# Patient Record
Sex: Female | Born: 1960 | Race: White | Hispanic: No | Marital: Married | Smoking: Former smoker
Health system: Southern US, Community
[De-identification: ages and names within clinical notes are randomized; demographics above are authoritative.]

## PROBLEM LIST (undated history)

## (undated) DIAGNOSIS — R570 Cardiogenic shock: Secondary | ICD-10-CM

## (undated) DIAGNOSIS — M1990 Unspecified osteoarthritis, unspecified site: Secondary | ICD-10-CM

## (undated) DIAGNOSIS — N170 Acute kidney failure with tubular necrosis: Secondary | ICD-10-CM

## (undated) DIAGNOSIS — I4892 Unspecified atrial flutter: Secondary | ICD-10-CM

## (undated) DIAGNOSIS — J9621 Acute and chronic respiratory failure with hypoxia: Secondary | ICD-10-CM

## (undated) DIAGNOSIS — I2510 Atherosclerotic heart disease of native coronary artery without angina pectoris: Secondary | ICD-10-CM

## (undated) HISTORY — PX: APPENDECTOMY: SHX54

## (undated) HISTORY — PX: TOTAL HIP ARTHROPLASTY: SHX124

## (undated) HISTORY — PX: TOTAL ABDOMINAL HYSTERECTOMY: SHX209

## (undated) HISTORY — PX: TONSILLECTOMY: SUR1361

---

## 2015-10-06 ENCOUNTER — Telehealth: Payer: Self-pay | Admitting: Internal Medicine

## 2015-10-06 DIAGNOSIS — K219 Gastro-esophageal reflux disease without esophagitis: Secondary | ICD-10-CM

## 2015-10-06 DIAGNOSIS — I119 Hypertensive heart disease without heart failure: Secondary | ICD-10-CM

## 2015-10-06 DIAGNOSIS — E785 Hyperlipidemia, unspecified: Secondary | ICD-10-CM

## 2015-10-06 DIAGNOSIS — I10 Essential (primary) hypertension: Secondary | ICD-10-CM

## 2015-10-06 HISTORY — DX: Hyperlipidemia, unspecified: E78.5

## 2015-10-06 HISTORY — DX: Essential (primary) hypertension: I10

## 2015-10-06 HISTORY — DX: Hypertensive heart disease without heart failure: I11.9

## 2015-10-06 HISTORY — DX: Gastro-esophageal reflux disease without esophagitis: K21.9

## 2015-10-06 NOTE — Telephone Encounter (Signed)
Potential transfer from [facility] per Dr. Earl Gala, but due to lack of SDU, rejected now.  55 year old lady with past medical history of hypertension, hyperlipidemia, GERD, arthritis, who presents with altered mental status.  Per EDP, patient became confused at work last night, and was sent home. Neighbor found the patient's mental status had worsened. Pt had an episode of arm shaking and leg stiffness, which lasted for about 30 seconds. Patient is very somnolent, falls asleep quickly. No other specific complaints.  In the emergency room, patient was found to have negative CT-head scan for acute intracranial abnormalities, negative chest x-ray, negative UDS, negative troponin 3, negative flu PCR, Tylenol and salicylate level normal, CK 781, negative alcohol, potassium 2.9, renal function normal, liver function normal, magnesium 2.2, blood pressure normal, temperature normal, no tachycardia. Oxygen desaturation to 80% in room air, which improved to 93 on nasal cannula oxygen. ABG showed pH 7.4, PCO2 40, PO2 55 on 2 L oxygen. Initially patient was accepted to step down bed, but there is no stepdown bed available now per better control. I asked EDP, dr. Allie Dimmer to call other facilities now. I also told Dr. Earl Gala that he can call me back before 7:00 AM to see if we have any SDU bed by that time. I recommended him to consult to neurologist for possible seizure and suggested to get CAT of chest to r/o PE. Now pt is not accepted due to lack of SDU bed.  Lorretta Harp, MD  [facility] Pager [phone]  If 7PM-7AM, please contact night-coverage [email or web address] Password [facility] 10/06/2015, 4:28 AM

## 2015-10-09 DIAGNOSIS — F32A Depression, unspecified: Secondary | ICD-10-CM

## 2015-10-09 HISTORY — DX: Depression, unspecified: F32.A

## 2015-11-10 DIAGNOSIS — F329 Major depressive disorder, single episode, unspecified: Secondary | ICD-10-CM | POA: Diagnosis not present

## 2015-11-10 DIAGNOSIS — I10 Essential (primary) hypertension: Secondary | ICD-10-CM | POA: Diagnosis not present

## 2015-11-10 DIAGNOSIS — R569 Unspecified convulsions: Secondary | ICD-10-CM | POA: Diagnosis not present

## 2015-11-10 DIAGNOSIS — R531 Weakness: Secondary | ICD-10-CM | POA: Diagnosis not present

## 2015-11-16 DIAGNOSIS — Z79899 Other long term (current) drug therapy: Secondary | ICD-10-CM | POA: Diagnosis not present

## 2015-11-16 DIAGNOSIS — R569 Unspecified convulsions: Secondary | ICD-10-CM | POA: Diagnosis not present

## 2015-12-02 DIAGNOSIS — Z6830 Body mass index (BMI) 30.0-30.9, adult: Secondary | ICD-10-CM | POA: Diagnosis not present

## 2015-12-02 DIAGNOSIS — R569 Unspecified convulsions: Secondary | ICD-10-CM | POA: Diagnosis not present

## 2015-12-02 DIAGNOSIS — Z79899 Other long term (current) drug therapy: Secondary | ICD-10-CM | POA: Diagnosis not present

## 2015-12-02 DIAGNOSIS — E538 Deficiency of other specified B group vitamins: Secondary | ICD-10-CM | POA: Diagnosis not present

## 2016-03-03 DIAGNOSIS — R51 Headache: Secondary | ICD-10-CM | POA: Diagnosis not present

## 2016-03-03 DIAGNOSIS — G479 Sleep disorder, unspecified: Secondary | ICD-10-CM | POA: Diagnosis not present

## 2016-03-03 DIAGNOSIS — R569 Unspecified convulsions: Secondary | ICD-10-CM | POA: Diagnosis not present

## 2016-03-03 DIAGNOSIS — G40909 Epilepsy, unspecified, not intractable, without status epilepticus: Secondary | ICD-10-CM

## 2016-03-03 HISTORY — DX: Epilepsy, unspecified, not intractable, without status epilepticus: G40.909

## 2016-04-05 DIAGNOSIS — E7800 Pure hypercholesterolemia, unspecified: Secondary | ICD-10-CM | POA: Diagnosis not present

## 2016-04-05 DIAGNOSIS — M159 Polyosteoarthritis, unspecified: Secondary | ICD-10-CM | POA: Diagnosis not present

## 2016-04-05 DIAGNOSIS — M25562 Pain in left knee: Secondary | ICD-10-CM | POA: Diagnosis not present

## 2016-04-05 DIAGNOSIS — Z1389 Encounter for screening for other disorder: Secondary | ICD-10-CM | POA: Diagnosis not present

## 2016-04-05 DIAGNOSIS — I10 Essential (primary) hypertension: Secondary | ICD-10-CM | POA: Diagnosis not present

## 2016-04-13 DIAGNOSIS — E7800 Pure hypercholesterolemia, unspecified: Secondary | ICD-10-CM | POA: Diagnosis not present

## 2016-04-13 DIAGNOSIS — M25552 Pain in left hip: Secondary | ICD-10-CM | POA: Diagnosis not present

## 2016-04-13 DIAGNOSIS — M159 Polyosteoarthritis, unspecified: Secondary | ICD-10-CM | POA: Diagnosis not present

## 2016-04-13 DIAGNOSIS — I10 Essential (primary) hypertension: Secondary | ICD-10-CM | POA: Diagnosis not present

## 2016-05-02 DIAGNOSIS — M25562 Pain in left knee: Secondary | ICD-10-CM | POA: Diagnosis not present

## 2016-05-02 DIAGNOSIS — I10 Essential (primary) hypertension: Secondary | ICD-10-CM | POA: Diagnosis not present

## 2016-05-02 DIAGNOSIS — E7800 Pure hypercholesterolemia, unspecified: Secondary | ICD-10-CM | POA: Diagnosis not present

## 2016-05-02 DIAGNOSIS — F329 Major depressive disorder, single episode, unspecified: Secondary | ICD-10-CM | POA: Diagnosis not present

## 2016-07-13 DIAGNOSIS — Z23 Encounter for immunization: Secondary | ICD-10-CM | POA: Diagnosis not present

## 2016-07-13 DIAGNOSIS — I10 Essential (primary) hypertension: Secondary | ICD-10-CM | POA: Diagnosis not present

## 2016-07-13 DIAGNOSIS — F329 Major depressive disorder, single episode, unspecified: Secondary | ICD-10-CM | POA: Diagnosis not present

## 2016-07-13 DIAGNOSIS — E7800 Pure hypercholesterolemia, unspecified: Secondary | ICD-10-CM | POA: Diagnosis not present

## 2016-07-13 DIAGNOSIS — M25562 Pain in left knee: Secondary | ICD-10-CM | POA: Diagnosis not present

## 2016-08-18 DIAGNOSIS — Z79899 Other long term (current) drug therapy: Secondary | ICD-10-CM | POA: Diagnosis not present

## 2016-08-18 DIAGNOSIS — G479 Sleep disorder, unspecified: Secondary | ICD-10-CM | POA: Diagnosis not present

## 2016-08-18 DIAGNOSIS — R569 Unspecified convulsions: Secondary | ICD-10-CM | POA: Diagnosis not present

## 2016-08-18 DIAGNOSIS — Z6832 Body mass index (BMI) 32.0-32.9, adult: Secondary | ICD-10-CM | POA: Diagnosis not present

## 2016-09-22 DIAGNOSIS — G479 Sleep disorder, unspecified: Secondary | ICD-10-CM | POA: Diagnosis not present

## 2016-09-22 DIAGNOSIS — Z79899 Other long term (current) drug therapy: Secondary | ICD-10-CM | POA: Diagnosis not present

## 2016-09-22 DIAGNOSIS — Z6832 Body mass index (BMI) 32.0-32.9, adult: Secondary | ICD-10-CM | POA: Diagnosis not present

## 2016-09-22 DIAGNOSIS — R569 Unspecified convulsions: Secondary | ICD-10-CM | POA: Diagnosis not present

## 2016-10-04 DIAGNOSIS — M25562 Pain in left knee: Secondary | ICD-10-CM | POA: Diagnosis not present

## 2016-10-04 DIAGNOSIS — I10 Essential (primary) hypertension: Secondary | ICD-10-CM | POA: Diagnosis not present

## 2016-10-04 DIAGNOSIS — M159 Polyosteoarthritis, unspecified: Secondary | ICD-10-CM | POA: Diagnosis not present

## 2016-10-04 DIAGNOSIS — E7800 Pure hypercholesterolemia, unspecified: Secondary | ICD-10-CM | POA: Diagnosis not present

## 2016-11-09 DIAGNOSIS — F329 Major depressive disorder, single episode, unspecified: Secondary | ICD-10-CM | POA: Diagnosis not present

## 2016-11-09 DIAGNOSIS — Z79899 Other long term (current) drug therapy: Secondary | ICD-10-CM | POA: Diagnosis not present

## 2016-11-09 DIAGNOSIS — E7800 Pure hypercholesterolemia, unspecified: Secondary | ICD-10-CM | POA: Diagnosis not present

## 2016-11-09 DIAGNOSIS — I10 Essential (primary) hypertension: Secondary | ICD-10-CM | POA: Diagnosis not present

## 2016-11-09 DIAGNOSIS — M25562 Pain in left knee: Secondary | ICD-10-CM | POA: Diagnosis not present

## 2016-11-22 DIAGNOSIS — R569 Unspecified convulsions: Secondary | ICD-10-CM | POA: Diagnosis not present

## 2016-11-22 DIAGNOSIS — Z6832 Body mass index (BMI) 32.0-32.9, adult: Secondary | ICD-10-CM | POA: Diagnosis not present

## 2017-04-17 DIAGNOSIS — I10 Essential (primary) hypertension: Secondary | ICD-10-CM | POA: Diagnosis not present

## 2017-04-17 DIAGNOSIS — R42 Dizziness and giddiness: Secondary | ICD-10-CM | POA: Diagnosis not present

## 2017-04-17 DIAGNOSIS — E7800 Pure hypercholesterolemia, unspecified: Secondary | ICD-10-CM | POA: Diagnosis not present

## 2017-04-17 DIAGNOSIS — F329 Major depressive disorder, single episode, unspecified: Secondary | ICD-10-CM | POA: Diagnosis not present

## 2017-05-31 DIAGNOSIS — Z1389 Encounter for screening for other disorder: Secondary | ICD-10-CM | POA: Diagnosis not present

## 2017-05-31 DIAGNOSIS — F329 Major depressive disorder, single episode, unspecified: Secondary | ICD-10-CM | POA: Diagnosis not present

## 2017-05-31 DIAGNOSIS — I10 Essential (primary) hypertension: Secondary | ICD-10-CM | POA: Diagnosis not present

## 2017-05-31 DIAGNOSIS — M25562 Pain in left knee: Secondary | ICD-10-CM | POA: Diagnosis not present

## 2017-05-31 DIAGNOSIS — Z23 Encounter for immunization: Secondary | ICD-10-CM | POA: Diagnosis not present

## 2017-05-31 DIAGNOSIS — E7800 Pure hypercholesterolemia, unspecified: Secondary | ICD-10-CM | POA: Diagnosis not present

## 2017-06-15 DIAGNOSIS — M25562 Pain in left knee: Secondary | ICD-10-CM | POA: Diagnosis not present

## 2017-06-15 DIAGNOSIS — F329 Major depressive disorder, single episode, unspecified: Secondary | ICD-10-CM | POA: Diagnosis not present

## 2017-06-15 DIAGNOSIS — E7800 Pure hypercholesterolemia, unspecified: Secondary | ICD-10-CM | POA: Diagnosis not present

## 2017-06-15 DIAGNOSIS — I10 Essential (primary) hypertension: Secondary | ICD-10-CM | POA: Diagnosis not present

## 2017-07-13 DIAGNOSIS — I10 Essential (primary) hypertension: Secondary | ICD-10-CM | POA: Diagnosis not present

## 2017-07-13 DIAGNOSIS — M25562 Pain in left knee: Secondary | ICD-10-CM | POA: Diagnosis not present

## 2017-07-13 DIAGNOSIS — E7800 Pure hypercholesterolemia, unspecified: Secondary | ICD-10-CM | POA: Diagnosis not present

## 2017-07-13 DIAGNOSIS — F329 Major depressive disorder, single episode, unspecified: Secondary | ICD-10-CM | POA: Diagnosis not present

## 2017-07-20 DIAGNOSIS — Z1231 Encounter for screening mammogram for malignant neoplasm of breast: Secondary | ICD-10-CM | POA: Diagnosis not present

## 2017-08-24 DIAGNOSIS — M159 Polyosteoarthritis, unspecified: Secondary | ICD-10-CM | POA: Diagnosis not present

## 2017-08-24 DIAGNOSIS — M25562 Pain in left knee: Secondary | ICD-10-CM | POA: Diagnosis not present

## 2017-08-24 DIAGNOSIS — E7800 Pure hypercholesterolemia, unspecified: Secondary | ICD-10-CM | POA: Diagnosis not present

## 2017-08-24 DIAGNOSIS — I10 Essential (primary) hypertension: Secondary | ICD-10-CM | POA: Diagnosis not present

## 2017-10-05 DIAGNOSIS — M25562 Pain in left knee: Secondary | ICD-10-CM | POA: Diagnosis not present

## 2017-10-05 DIAGNOSIS — F329 Major depressive disorder, single episode, unspecified: Secondary | ICD-10-CM | POA: Diagnosis not present

## 2017-10-05 DIAGNOSIS — E7800 Pure hypercholesterolemia, unspecified: Secondary | ICD-10-CM | POA: Diagnosis not present

## 2017-10-05 DIAGNOSIS — I10 Essential (primary) hypertension: Secondary | ICD-10-CM | POA: Diagnosis not present

## 2017-11-02 DIAGNOSIS — F329 Major depressive disorder, single episode, unspecified: Secondary | ICD-10-CM | POA: Diagnosis not present

## 2017-11-02 DIAGNOSIS — I10 Essential (primary) hypertension: Secondary | ICD-10-CM | POA: Diagnosis not present

## 2017-11-02 DIAGNOSIS — F419 Anxiety disorder, unspecified: Secondary | ICD-10-CM | POA: Diagnosis not present

## 2017-11-02 DIAGNOSIS — M25562 Pain in left knee: Secondary | ICD-10-CM | POA: Diagnosis not present

## 2018-02-22 DIAGNOSIS — F419 Anxiety disorder, unspecified: Secondary | ICD-10-CM | POA: Diagnosis not present

## 2018-02-22 DIAGNOSIS — M25562 Pain in left knee: Secondary | ICD-10-CM | POA: Diagnosis not present

## 2018-02-22 DIAGNOSIS — I10 Essential (primary) hypertension: Secondary | ICD-10-CM | POA: Diagnosis not present

## 2018-02-22 DIAGNOSIS — F329 Major depressive disorder, single episode, unspecified: Secondary | ICD-10-CM | POA: Diagnosis not present

## 2018-04-27 DIAGNOSIS — E7800 Pure hypercholesterolemia, unspecified: Secondary | ICD-10-CM | POA: Diagnosis not present

## 2018-04-27 DIAGNOSIS — F419 Anxiety disorder, unspecified: Secondary | ICD-10-CM | POA: Diagnosis not present

## 2018-04-27 DIAGNOSIS — I10 Essential (primary) hypertension: Secondary | ICD-10-CM | POA: Diagnosis not present

## 2018-04-27 DIAGNOSIS — F329 Major depressive disorder, single episode, unspecified: Secondary | ICD-10-CM | POA: Diagnosis not present

## 2018-04-27 DIAGNOSIS — M25562 Pain in left knee: Secondary | ICD-10-CM | POA: Diagnosis not present

## 2018-06-14 DIAGNOSIS — E7800 Pure hypercholesterolemia, unspecified: Secondary | ICD-10-CM | POA: Diagnosis not present

## 2018-06-14 DIAGNOSIS — M25562 Pain in left knee: Secondary | ICD-10-CM | POA: Diagnosis not present

## 2018-06-14 DIAGNOSIS — I10 Essential (primary) hypertension: Secondary | ICD-10-CM | POA: Diagnosis not present

## 2018-06-14 DIAGNOSIS — F419 Anxiety disorder, unspecified: Secondary | ICD-10-CM | POA: Diagnosis not present

## 2018-06-14 DIAGNOSIS — Z23 Encounter for immunization: Secondary | ICD-10-CM | POA: Diagnosis not present

## 2018-09-03 DIAGNOSIS — E7800 Pure hypercholesterolemia, unspecified: Secondary | ICD-10-CM | POA: Diagnosis not present

## 2018-09-03 DIAGNOSIS — J069 Acute upper respiratory infection, unspecified: Secondary | ICD-10-CM | POA: Diagnosis not present

## 2018-09-03 DIAGNOSIS — R42 Dizziness and giddiness: Secondary | ICD-10-CM | POA: Diagnosis not present

## 2018-09-03 DIAGNOSIS — I10 Essential (primary) hypertension: Secondary | ICD-10-CM | POA: Diagnosis not present

## 2018-09-19 DIAGNOSIS — I10 Essential (primary) hypertension: Secondary | ICD-10-CM | POA: Diagnosis not present

## 2018-09-19 DIAGNOSIS — Z1331 Encounter for screening for depression: Secondary | ICD-10-CM | POA: Diagnosis not present

## 2018-09-19 DIAGNOSIS — K219 Gastro-esophageal reflux disease without esophagitis: Secondary | ICD-10-CM | POA: Diagnosis not present

## 2018-09-19 DIAGNOSIS — F419 Anxiety disorder, unspecified: Secondary | ICD-10-CM | POA: Diagnosis not present

## 2018-09-19 DIAGNOSIS — M159 Polyosteoarthritis, unspecified: Secondary | ICD-10-CM | POA: Diagnosis not present

## 2018-09-19 DIAGNOSIS — E7800 Pure hypercholesterolemia, unspecified: Secondary | ICD-10-CM | POA: Diagnosis not present

## 2018-09-19 DIAGNOSIS — M25562 Pain in left knee: Secondary | ICD-10-CM | POA: Diagnosis not present

## 2019-06-21 DIAGNOSIS — M25562 Pain in left knee: Secondary | ICD-10-CM | POA: Diagnosis not present

## 2019-06-21 DIAGNOSIS — M159 Polyosteoarthritis, unspecified: Secondary | ICD-10-CM | POA: Diagnosis not present

## 2019-06-21 DIAGNOSIS — F419 Anxiety disorder, unspecified: Secondary | ICD-10-CM | POA: Diagnosis not present

## 2019-06-21 DIAGNOSIS — K219 Gastro-esophageal reflux disease without esophagitis: Secondary | ICD-10-CM | POA: Diagnosis not present

## 2019-06-21 DIAGNOSIS — E7800 Pure hypercholesterolemia, unspecified: Secondary | ICD-10-CM | POA: Diagnosis not present

## 2019-06-21 DIAGNOSIS — I10 Essential (primary) hypertension: Secondary | ICD-10-CM | POA: Diagnosis not present

## 2019-11-14 DIAGNOSIS — M25562 Pain in left knee: Secondary | ICD-10-CM | POA: Diagnosis not present

## 2019-11-14 DIAGNOSIS — M159 Polyosteoarthritis, unspecified: Secondary | ICD-10-CM | POA: Diagnosis not present

## 2019-11-14 DIAGNOSIS — I10 Essential (primary) hypertension: Secondary | ICD-10-CM | POA: Diagnosis not present

## 2019-11-14 DIAGNOSIS — K219 Gastro-esophageal reflux disease without esophagitis: Secondary | ICD-10-CM | POA: Diagnosis not present

## 2019-11-18 DIAGNOSIS — M25562 Pain in left knee: Secondary | ICD-10-CM | POA: Diagnosis not present

## 2019-11-18 DIAGNOSIS — E7800 Pure hypercholesterolemia, unspecified: Secondary | ICD-10-CM | POA: Diagnosis not present

## 2019-11-18 DIAGNOSIS — R42 Dizziness and giddiness: Secondary | ICD-10-CM | POA: Diagnosis not present

## 2019-11-18 DIAGNOSIS — F329 Major depressive disorder, single episode, unspecified: Secondary | ICD-10-CM | POA: Diagnosis not present

## 2019-11-18 DIAGNOSIS — I10 Essential (primary) hypertension: Secondary | ICD-10-CM | POA: Diagnosis not present

## 2019-11-18 DIAGNOSIS — K219 Gastro-esophageal reflux disease without esophagitis: Secondary | ICD-10-CM | POA: Diagnosis not present

## 2019-11-24 DIAGNOSIS — K219 Gastro-esophageal reflux disease without esophagitis: Secondary | ICD-10-CM | POA: Diagnosis not present

## 2019-11-24 DIAGNOSIS — R42 Dizziness and giddiness: Secondary | ICD-10-CM | POA: Diagnosis not present

## 2019-11-24 DIAGNOSIS — F329 Major depressive disorder, single episode, unspecified: Secondary | ICD-10-CM | POA: Diagnosis not present

## 2019-11-24 DIAGNOSIS — M25552 Pain in left hip: Secondary | ICD-10-CM | POA: Diagnosis not present

## 2020-04-25 DIAGNOSIS — R55 Syncope and collapse: Secondary | ICD-10-CM

## 2020-04-25 DIAGNOSIS — Z8669 Personal history of other diseases of the nervous system and sense organs: Secondary | ICD-10-CM

## 2020-04-25 DIAGNOSIS — I10 Essential (primary) hypertension: Secondary | ICD-10-CM | POA: Insufficient documentation

## 2020-04-25 DIAGNOSIS — R0602 Shortness of breath: Secondary | ICD-10-CM

## 2020-04-25 HISTORY — DX: Shortness of breath: R06.02

## 2020-04-25 HISTORY — DX: Personal history of other diseases of the nervous system and sense organs: Z86.69

## 2020-04-25 HISTORY — DX: Syncope and collapse: R55

## 2020-04-26 HISTORY — PX: AORTIC VALVE REPLACEMENT (AVR)/CORONARY ARTERY BYPASS GRAFTING (CABG): SHX5725

## 2020-06-01 ENCOUNTER — Other Ambulatory Visit (HOSPITAL_COMMUNITY): Payer: 59

## 2020-06-01 ENCOUNTER — Inpatient Hospital Stay
Admission: EM | Admit: 2020-06-01 | Discharge: 2020-07-16 | Disposition: A | Discharge status: Rehabilitation Facility | Payer: 59 | Source: Other Acute Inpatient Hospital | Attending: Internal Medicine | Admitting: Internal Medicine

## 2020-06-01 DIAGNOSIS — R570 Cardiogenic shock: Secondary | ICD-10-CM | POA: Diagnosis present

## 2020-06-01 DIAGNOSIS — I4892 Unspecified atrial flutter: Secondary | ICD-10-CM | POA: Diagnosis present

## 2020-06-01 DIAGNOSIS — J9621 Acute and chronic respiratory failure with hypoxia: Secondary | ICD-10-CM | POA: Diagnosis present

## 2020-06-01 DIAGNOSIS — N170 Acute kidney failure with tubular necrosis: Secondary | ICD-10-CM | POA: Diagnosis present

## 2020-06-01 DIAGNOSIS — J189 Pneumonia, unspecified organism: Secondary | ICD-10-CM

## 2020-06-01 DIAGNOSIS — R509 Fever, unspecified: Secondary | ICD-10-CM

## 2020-06-01 DIAGNOSIS — D72829 Elevated white blood cell count, unspecified: Secondary | ICD-10-CM

## 2020-06-01 DIAGNOSIS — T85598A Other mechanical complication of other gastrointestinal prosthetic devices, implants and grafts, initial encounter: Secondary | ICD-10-CM

## 2020-06-01 DIAGNOSIS — J9690 Respiratory failure, unspecified, unspecified whether with hypoxia or hypercapnia: Secondary | ICD-10-CM

## 2020-06-01 DIAGNOSIS — I2510 Atherosclerotic heart disease of native coronary artery without angina pectoris: Secondary | ICD-10-CM | POA: Diagnosis present

## 2020-06-01 HISTORY — DX: Unspecified atrial flutter: I48.92

## 2020-06-01 HISTORY — DX: Atherosclerotic heart disease of native coronary artery without angina pectoris: I25.10

## 2020-06-01 HISTORY — DX: Acute kidney failure with tubular necrosis: N17.0

## 2020-06-01 HISTORY — DX: Cardiogenic shock: R57.0

## 2020-06-01 HISTORY — DX: Acute and chronic respiratory failure with hypoxia: J96.21

## 2020-06-01 IMAGING — DX DG ABD PORTABLE 1V
1 series · 1 of 1 positions shown · non-contrast
Comparison: None.

CLINICAL DATA: NG tube placement

EXAM:
PORTABLE ABDOMEN - 1 VIEW

[abdomen kub]
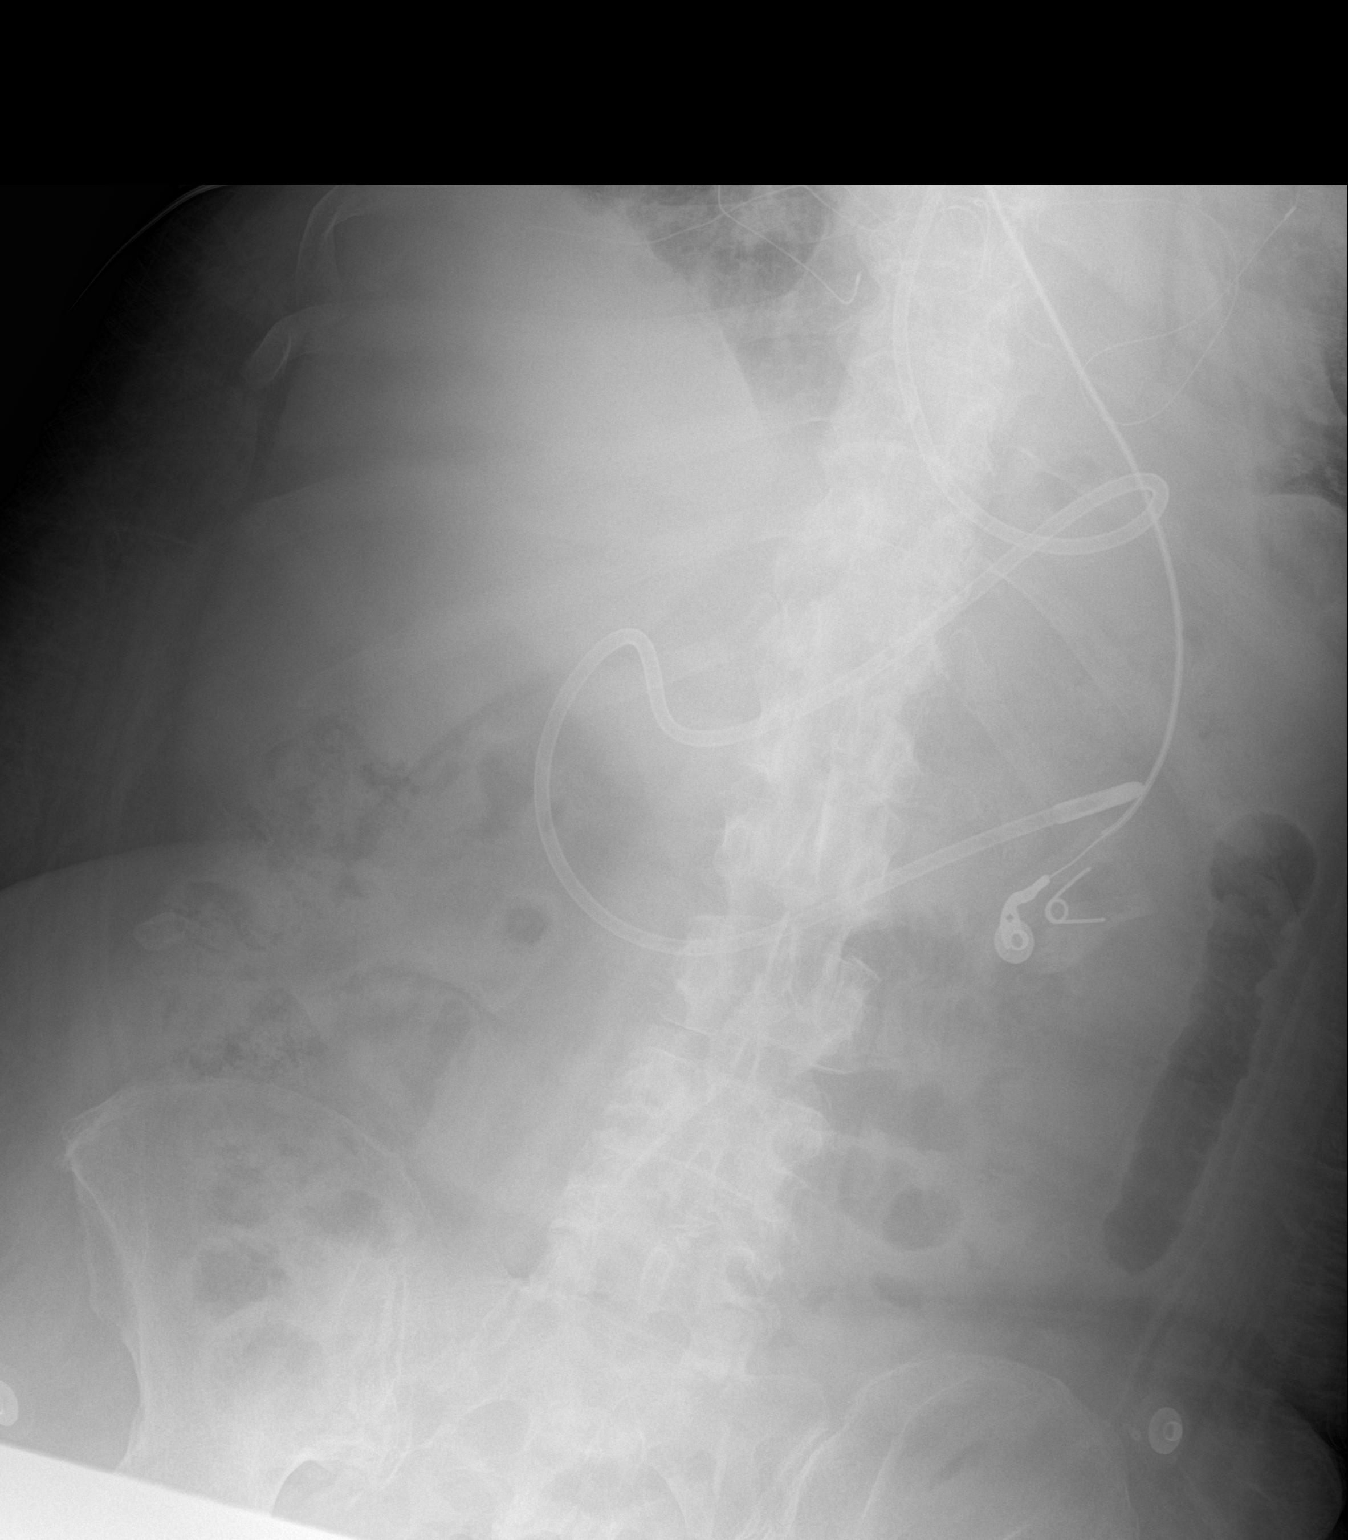

[1 of 1 positions shown; findings below may reference images not displayed]

FINDINGS: The tip of the feeding tube is likely located within the proximal
jejunum. The visualized bowel gas pattern is unremarkable. There is
a moderate amount of stool in the ascending colon. There are no
radiopaque kidney stones.
IMPRESSION: Feeding tube tip projects over the proximal jejunum.

## 2020-06-02 ENCOUNTER — Other Ambulatory Visit (HOSPITAL_COMMUNITY): Payer: 59

## 2020-06-02 ENCOUNTER — Encounter: Payer: Self-pay | Admitting: Internal Medicine

## 2020-06-02 DIAGNOSIS — I4892 Unspecified atrial flutter: Secondary | ICD-10-CM | POA: Diagnosis present

## 2020-06-02 DIAGNOSIS — I2510 Atherosclerotic heart disease of native coronary artery without angina pectoris: Secondary | ICD-10-CM

## 2020-06-02 DIAGNOSIS — J9621 Acute and chronic respiratory failure with hypoxia: Secondary | ICD-10-CM

## 2020-06-02 DIAGNOSIS — R570 Cardiogenic shock: Secondary | ICD-10-CM | POA: Diagnosis not present

## 2020-06-02 DIAGNOSIS — N170 Acute kidney failure with tubular necrosis: Secondary | ICD-10-CM

## 2020-06-02 DIAGNOSIS — I483 Typical atrial flutter: Secondary | ICD-10-CM | POA: Diagnosis not present

## 2020-06-02 LAB — URINALYSIS, ROUTINE W REFLEX MICROSCOPIC
Bilirubin Urine: NEGATIVE
Glucose, UA: NEGATIVE mg/dL
Ketones, ur: NEGATIVE mg/dL
Nitrite: NEGATIVE
Protein, ur: 100 mg/dL — AB
Specific Gravity, Urine: 1.016 (ref 1.005–1.030)
pH: 5 (ref 5.0–8.0)

## 2020-06-02 LAB — CBC
HCT: 29 % — ABNORMAL LOW (ref 36.0–46.0)
Hemoglobin: 8.5 g/dL — ABNORMAL LOW (ref 12.0–15.0)
MCH: 28.9 pg (ref 26.0–34.0)
MCHC: 29.3 g/dL — ABNORMAL LOW (ref 30.0–36.0)
MCV: 98.6 fL (ref 80.0–100.0)
Platelets: 346 10*3/uL (ref 150–400)
RBC: 2.94 MIL/uL — ABNORMAL LOW (ref 3.87–5.11)
RDW: 16.3 % — ABNORMAL HIGH (ref 11.5–15.5)
WBC: 16.5 10*3/uL — ABNORMAL HIGH (ref 4.0–10.5)
nRBC: 0 % (ref 0.0–0.2)

## 2020-06-02 LAB — BASIC METABOLIC PANEL
Anion gap: 12 (ref 5–15)
BUN: 50 mg/dL — ABNORMAL HIGH (ref 6–20)
CO2: 34 mmol/L — ABNORMAL HIGH (ref 22–32)
Calcium: 9.2 mg/dL (ref 8.9–10.3)
Chloride: 99 mmol/L (ref 98–111)
Creatinine, Ser: 1.03 mg/dL — ABNORMAL HIGH (ref 0.44–1.00)
GFR, Estimated: >60 mL/min (ref >60)
Glucose, Bld: 154 mg/dL — ABNORMAL HIGH (ref 70–99)
Potassium: 3.7 mmol/L (ref 3.5–5.1)
Sodium: 145 mmol/L (ref 135–145)

## 2020-06-02 IMAGING — DX DG CHEST 1V PORT
1 series · 1 of 1 positions shown · non-contrast
Comparison: Chest radiograph and chest CT [DATE].
Abdominal radiograph [DATE]

CLINICAL DATA: Difficulty breathing

EXAM:
PORTABLE CHEST 1 VIEW

[chest ap]
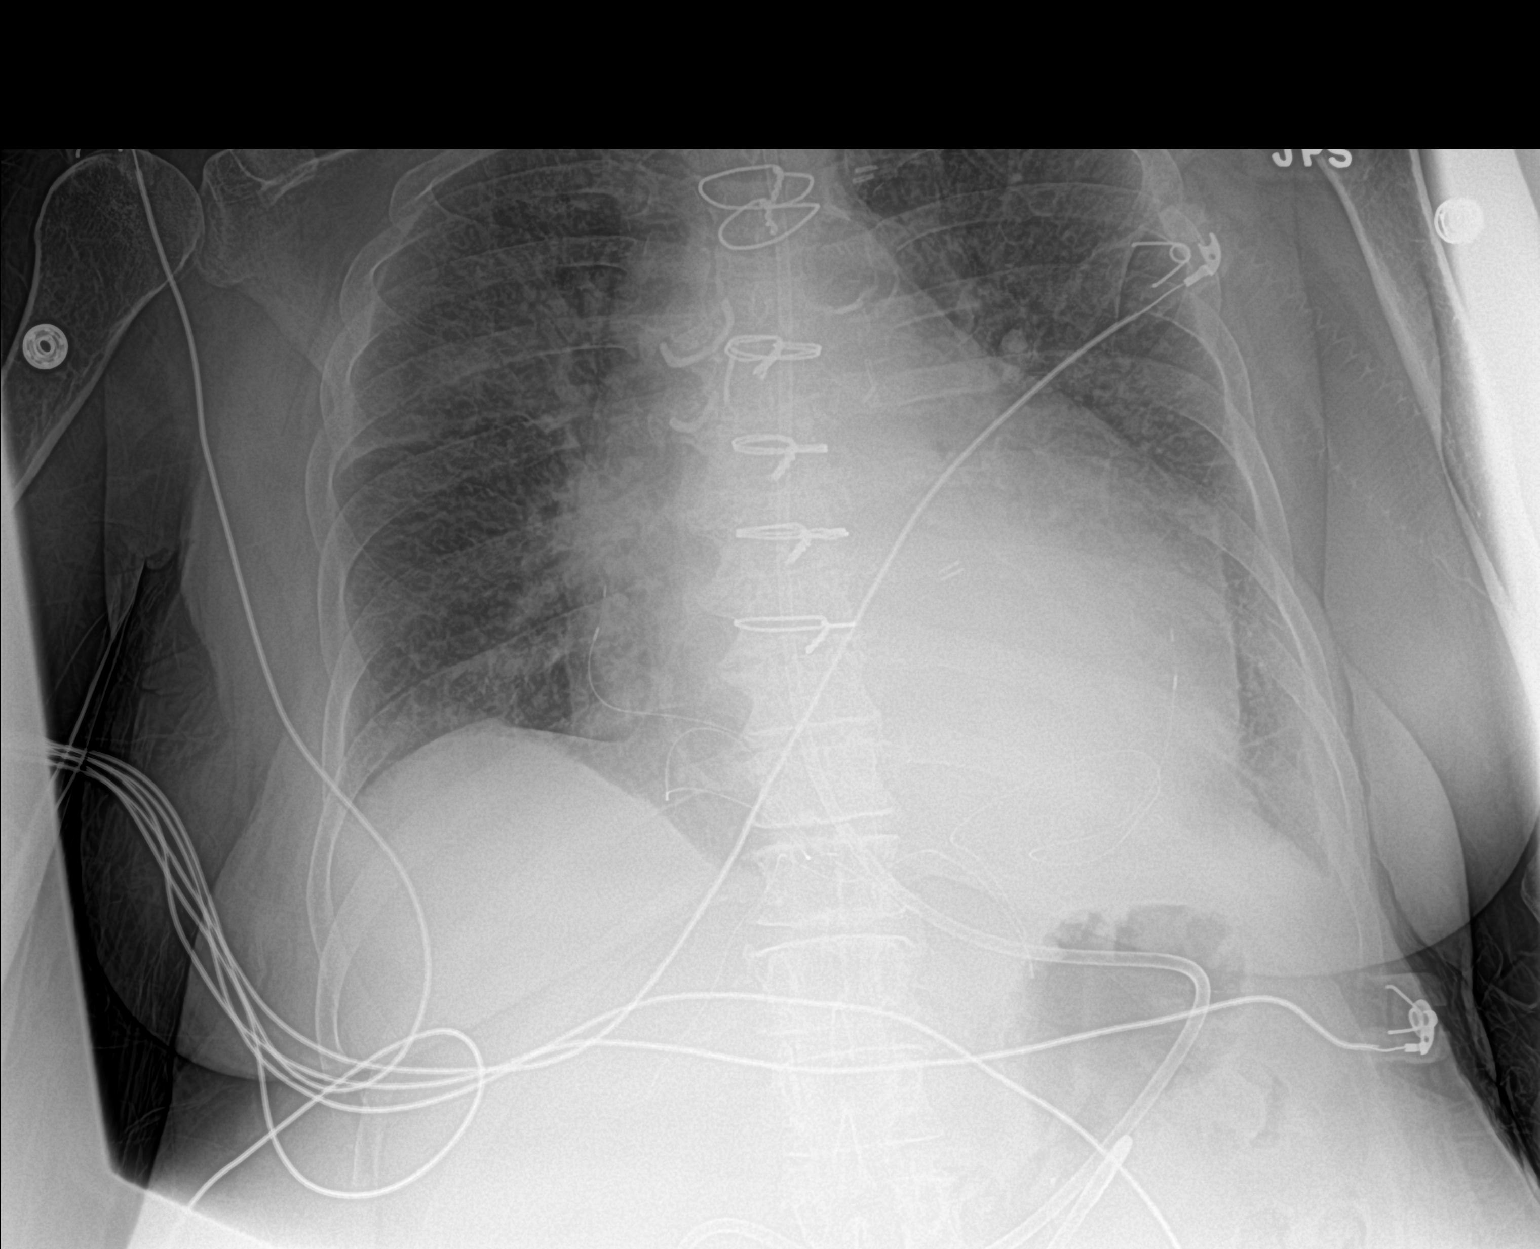

[1 of 1 positions shown; findings below may reference images not displayed]

FINDINGS: Tracheostomy catheter tip is 7.9 cm above the carina. Feeding tube
tip is in the left upper quadrant. Based on abdomen radiograph from
1 day prior, tip is felt to be in the distal duodenum. There are
pacemaker wires attached to the right heart.

There is cardiomegaly with pulmonary venous hypertension. There is
interstitial edema with subtle ill-defined opacity in the right
upper lobe. There is aortic atherosclerosis. Status post coronary
artery bypass grafting. No adenopathy. No bone lesions.
IMPRESSION: Tube positions as described without pneumothorax. There is
cardiomegaly with pulmonary vascular congestion. There is mild
interstitial edema. Question mild alveolar edema versus developing
pneumonia right upper lobe. Patient is status post coronary artery
bypass grafting.

Aortic Atherosclerosis ([BP]-[BP]).

## 2020-06-02 NOTE — Consult Note (Signed)
Pulmonary Critical Care Medicine Teresa [name]  PULMONARY SERVICE  Date of Service: 06/02/2020  PULMONARY CRITICAL CARE CONSULT   [name]  NWG:[id number]  DOB: [birth year]   DOA: 06/01/2020  Referring Physician: Carron Curie, MD  HPI: Teresa [name] is a 59 y.o. female seen for follow up of Acute on Chronic Respiratory Failure.  Patient has multiple medical problems including hypertension hyperlipidemia ongoing smoking seizure disorder who came into the hospital because of syncope.  Patient has been experiencing some chest pain and shortness of breath.  Apparently was discovered to have a non-STEMI at that time.  Patient went to the Cath Lab and had a left heart catheterization and was subsequently sent to the OR for CABG on April 27, 2020.  Patient apparently had cardiogenic shock in the setting of ACS at that time.  Patient had significant blood loss requiring transfusions postoperatively required vasopressors inotropic support and CRRT.  Patient was not able to come off the ventilator and ended up having to have a tracheostomy.  Now transferred to our facility for further management.  Currently is off the ventilator on T collar and has been requiring 40% FiO2  Review of Systems:  ROS performed and is unremarkable other than noted above.  History reviewed. No pertinent past medical history.  Past Surgical History:  Procedure Laterality Date  . CORONARY ARTERY BYPASS GRAFT N/A 04/25/2020  Procedure: CABG ON PUMP - CORONARY ARTERY BYPASS GRAFT X 4; Surgeon: Tiajuana Amass, MD; Location: [facility] MAIN OR; Service: Cardiothoracic; Laterality: N/A;  . DEBRIDEMENT STERNAL N/A 04/30/2020  Procedure: IRRIGATION & DEBRIDEMENT CHEST WALL / STERNUM; Surgeon: Tiajuana Amass, MD; Location: [facility] MAIN OR; Service: Cardiothoracic; Laterality: N/A;  . IMPELLA INSERTION Left 04/25/2020  Procedure: IMPELLA INSERTION; Surgeon: Tiajuana Amass, MD; Location[facility] MA[facility]; Service:  Cardiothoracic; Laterality: Left;  . IMPELLA REMOVAL Left 05/07/2020  Procedure: IMPELLA REMOVAL; Surgeon: Tiajuana Amass, MD; Location: [facility] MAIN OR; Service: Cardiothoracic; Laterality: Left;  . TRACHEOSTOMY N/A 05/10/2020  Procedure: TRACHEOTOMY; Surgeon: Tiajuana Amass, MD;  . Cardiogenic shock (HCC) 04/26/2020  . NSTEMI (non-ST elevated myocardial infarction) (HCC) 04/25/2020  . Syncope 04/25/2020  . History of tonic-clonic seizures 04/25/2020  . Essential hypertension 04/25/2020  . Hyperlipidemia 04/25/2020  . Shortness of breath 04/25/2020   Family History of: Details  Colon Cancer No  Inflammatory Bowel Disease No  Liver Disease No   Social History   Socioeconomic History  . Marital status: Single  Spouse name: Not on file  . Number of children: Not on file  . Years of education: Not on file  . Highest education level: Not on file  Occupational History  . Not on file  Tobacco Use  . Smoking status: Current Every Day Smoker  Packs/day: 1.50  Years: 40.00  Pack years: 60.00  Types: Cigarettes    Medications: Reviewed on Rounds  Physical Exam:  Vitals: Temperature is 98.6 pulse 97 respiratory rate 26 blood pressure is 132/80 saturations 98%  Ventilator Settings on T collar with an FiO2 of 40%  . General: Comfortable at this time . Eyes: Grossly normal lids, irises & conjunctiva . ENT: grossly tongue is normal . Neck: no obvious mass . Cardiovascular: S1-S2 normal no gallop or rub . Respiratory: Scattered rhonchi expansion is equal . Abdomen: Soft and nontender . Skin: no rash seen on limited exam . Musculoskeletal: not rigid . Psychiatric:unable to assess . Neurologic: no seizure no involuntary movements  Labs on Admission:  Basic Metabolic Panel: Recent Labs  Lab 06/02/20 0507  NA 145  K 3.7  CL 99  CO2 34*  GLUCOSE 154*  BUN 50*  CREATININE 1.03*  CALCIUM 9.2    No results for input(s): PHART, PCO2ART, PO2ART, HCO3, O2SAT in  the last 168 hours.  Liver Function Tests: No results for input(s): AST, ALT, ALKPHOS, BILITOT, PROT, ALBUMIN in the last 168 hours. No results for input(s): LIPASE, AMYLASE in the last 168 hours. No results for input(s): AMMONIA in the last 168 hours.  CBC: Recent Labs  Lab 06/02/20 0507  WBC 16.5*  HGB 8.5*  HCT 29.0*  MCV 98.6  PLT 346    Cardiac Enzymes: No results for input(s): CKTOTAL, CKMB, CKMBINDEX, TROPONINI in the last 168 hours.  BNP (last 3 results) No results for input(s): BNP in the last 8760 hours.  ProBNP (last 3 results) No results for input(s): PROBNP in the last 8760 hours.   Radiological Exams on Admission: DG CHEST PORT 1 VIEW  Result Date: 06/02/2020 CLINICAL DATA:  Difficulty breathing EXAM: PORTABLE CHEST 1 VIEW COMPARISON:  Chest radiograph and chest CT April 24, 2020. Abdominal radiograph June 01, 2020 FINDINGS: Tracheostomy catheter tip is 7.9 cm above the carina. Feeding tube tip is in the left upper quadrant. Based on abdomen radiograph from 1 day prior, tip is felt to be in the distal duodenum. 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 There is cardiomegaly with pulmonary venous hypertension. There is interstitial edema with subtle ill-defined opacity in the right upper lobe. There is aortic atherosclerosis. Status post coronary artery bypass grafting. No adenopathy. No bone lesions. IMPRESSION: Tube positions as described without pneumothorax. There is cardiomegaly with pulmonary vascular congestion. There is mild interstitial edema. Question mild alveolar edema versus developing pneumonia right upper lobe. Patient is status post coronary artery bypass grafting. Aortic Atherosclerosis (ICD10-I70.0). Electronically Signed   By: Bretta Bang III M.D.   On: 06/02/2020 13:00   DG Abd Portable 1V  Result Date: 06/01/2020 CLINICAL DATA:  NG tube placement EXAM: PORTABLE ABDOMEN - 1 VIEW COMPARISON:  None. FINDINGS: The tip of the  feeding tube is likely located within the proximal jejunum. The visualized bowel gas pattern is unremarkable. There is a moderate amount of stool in the ascending colon. There are no radiopaque kidney stones. IMPRESSION: Feeding tube tip projects over the proximal jejunum. Electronically Signed   By: Katherine Mantle M.D.   On: 06/01/2020 23:57    Assessment/Plan Active Problems:   Acute on chronic respiratory failure with hypoxia (HCC)   Atrial flutter (HCC)   Cardiogenic shock (HCC)   Coronary artery disease involving native coronary artery of native heart   Acute renal failure due to tubular necrosis (HCC)   1. Acute on chronic respiratory failure with hypoxia patient currently is off the ventilator has been on 40% FiO2 looks pretty good as far as ability to wean right now we will continue to follow closely.  We will continue to keep the patient euvolemic.  The patient will need a cardiology consultation. 2. Atrial flutter patient's rate controlled we will continue with supportive care 3. Coronary artery disease native vessel status post CABG we will continue to follow along.  Patient is status post non-STEMI. 4. Acute renal failure patient's creatinine appears to have come down to 1.03 BUN is still slightly elevated.  Patient did have acute renal failure which had required CRRT at the other facility need to continue  to follow the fluid status closely  I have personally seen and evaluated the patient, evaluated laboratory and imaging results, formulated the assessment and plan and placed orders. The Patient requires high complexity decision making with multiple systems involvement.  Case was discussed on Rounds with the Respiratory Therapy Director and the Respiratory staff Time Spent  Yevonne Pax, MD [facility] Pulmonary Critical Care Medicine Sleep Medicine

## 2020-06-03 ENCOUNTER — Other Ambulatory Visit (HOSPITAL_COMMUNITY): Payer: 59

## 2020-06-03 DIAGNOSIS — I483 Typical atrial flutter: Secondary | ICD-10-CM | POA: Diagnosis not present

## 2020-06-03 DIAGNOSIS — I2510 Atherosclerotic heart disease of native coronary artery without angina pectoris: Secondary | ICD-10-CM | POA: Diagnosis not present

## 2020-06-03 DIAGNOSIS — J9621 Acute and chronic respiratory failure with hypoxia: Secondary | ICD-10-CM | POA: Diagnosis not present

## 2020-06-03 DIAGNOSIS — N170 Acute kidney failure with tubular necrosis: Secondary | ICD-10-CM | POA: Diagnosis not present

## 2020-06-03 LAB — BLOOD GAS, ARTERIAL
Acid-Base Excess: 12.5 mmol/L — ABNORMAL HIGH (ref 0.0–2.0)
Acid-Base Excess: 9.8 mmol/L — ABNORMAL HIGH (ref 0.0–2.0)
Bicarbonate: 37.5 mmol/L — ABNORMAL HIGH (ref 20.0–28.0)
Bicarbonate: 38.7 mmol/L — ABNORMAL HIGH (ref 20.0–28.0)
FIO2: 30
FIO2: 40
O2 Saturation: 89 %
O2 Saturation: 94.9 %
Patient temperature: 35.5
Patient temperature: 37
pCO2 arterial: 109 mmHg (ref 32.0–48.0)
pCO2 arterial: 57.8 mmHg — ABNORMAL HIGH (ref 32.0–48.0)
pH, Arterial: 7.165 — CL (ref 7.350–7.450)
pH, Arterial: 7.428 (ref 7.350–7.450)
pO2, Arterial: 61.8 mmHg — ABNORMAL LOW (ref 83.0–108.0)
pO2, Arterial: 67.3 mmHg — ABNORMAL LOW (ref 83.0–108.0)

## 2020-06-03 LAB — COMPREHENSIVE METABOLIC PANEL
ALT: 29 U/L (ref 0–44)
AST: 27 U/L (ref 15–41)
Albumin: 2.5 g/dL — ABNORMAL LOW (ref 3.5–5.0)
Alkaline Phosphatase: 97 U/L (ref 38–126)
Anion gap: 10 (ref 5–15)
BUN: 56 mg/dL — ABNORMAL HIGH (ref 6–20)
CO2: 36 mmol/L — ABNORMAL HIGH (ref 22–32)
Calcium: 9.3 mg/dL (ref 8.9–10.3)
Chloride: 100 mmol/L (ref 98–111)
Creatinine, Ser: 1.09 mg/dL — ABNORMAL HIGH (ref 0.44–1.00)
GFR, Estimated: 58 mL/min — ABNORMAL LOW (ref >60)
Glucose, Bld: 150 mg/dL — ABNORMAL HIGH (ref 70–99)
Potassium: 4 mmol/L (ref 3.5–5.1)
Sodium: 146 mmol/L — ABNORMAL HIGH (ref 135–145)
Total Bilirubin: 0.5 mg/dL (ref 0.3–1.2)
Total Protein: 7 g/dL (ref 6.5–8.1)

## 2020-06-03 LAB — CBC
HCT: 28.4 % — ABNORMAL LOW (ref 36.0–46.0)
Hemoglobin: 8.2 g/dL — ABNORMAL LOW (ref 12.0–15.0)
MCH: 28.6 pg (ref 26.0–34.0)
MCHC: 28.9 g/dL — ABNORMAL LOW (ref 30.0–36.0)
MCV: 99 fL (ref 80.0–100.0)
Platelets: 309 10*3/uL (ref 150–400)
RBC: 2.87 MIL/uL — ABNORMAL LOW (ref 3.87–5.11)
RDW: 15.9 % — ABNORMAL HIGH (ref 11.5–15.5)
WBC: 17.8 10*3/uL — ABNORMAL HIGH (ref 4.0–10.5)
nRBC: 0 % (ref 0.0–0.2)

## 2020-06-03 LAB — T4, FREE: Free T4: 1.07 ng/dL (ref 0.61–1.12)

## 2020-06-03 LAB — HEMOGLOBIN A1C
Hgb A1c MFr Bld: 5.8 % — ABNORMAL HIGH (ref 4.8–5.6)
Mean Plasma Glucose: 120 mg/dL

## 2020-06-03 LAB — PHOSPHORUS: Phosphorus: 3.5 mg/dL (ref 2.5–4.6)

## 2020-06-03 LAB — TSH: TSH: 1.863 u[IU]/mL (ref 0.350–4.500)

## 2020-06-03 LAB — MAGNESIUM: Magnesium: 1.8 mg/dL (ref 1.7–2.4)

## 2020-06-03 IMAGING — DX DG CHEST 1V PORT
1 series · 1 of 1 positions shown · non-contrast
Comparison: [DATE]

CLINICAL DATA: Respiratory failure

EXAM:
PORTABLE CHEST 1 VIEW

[chest]
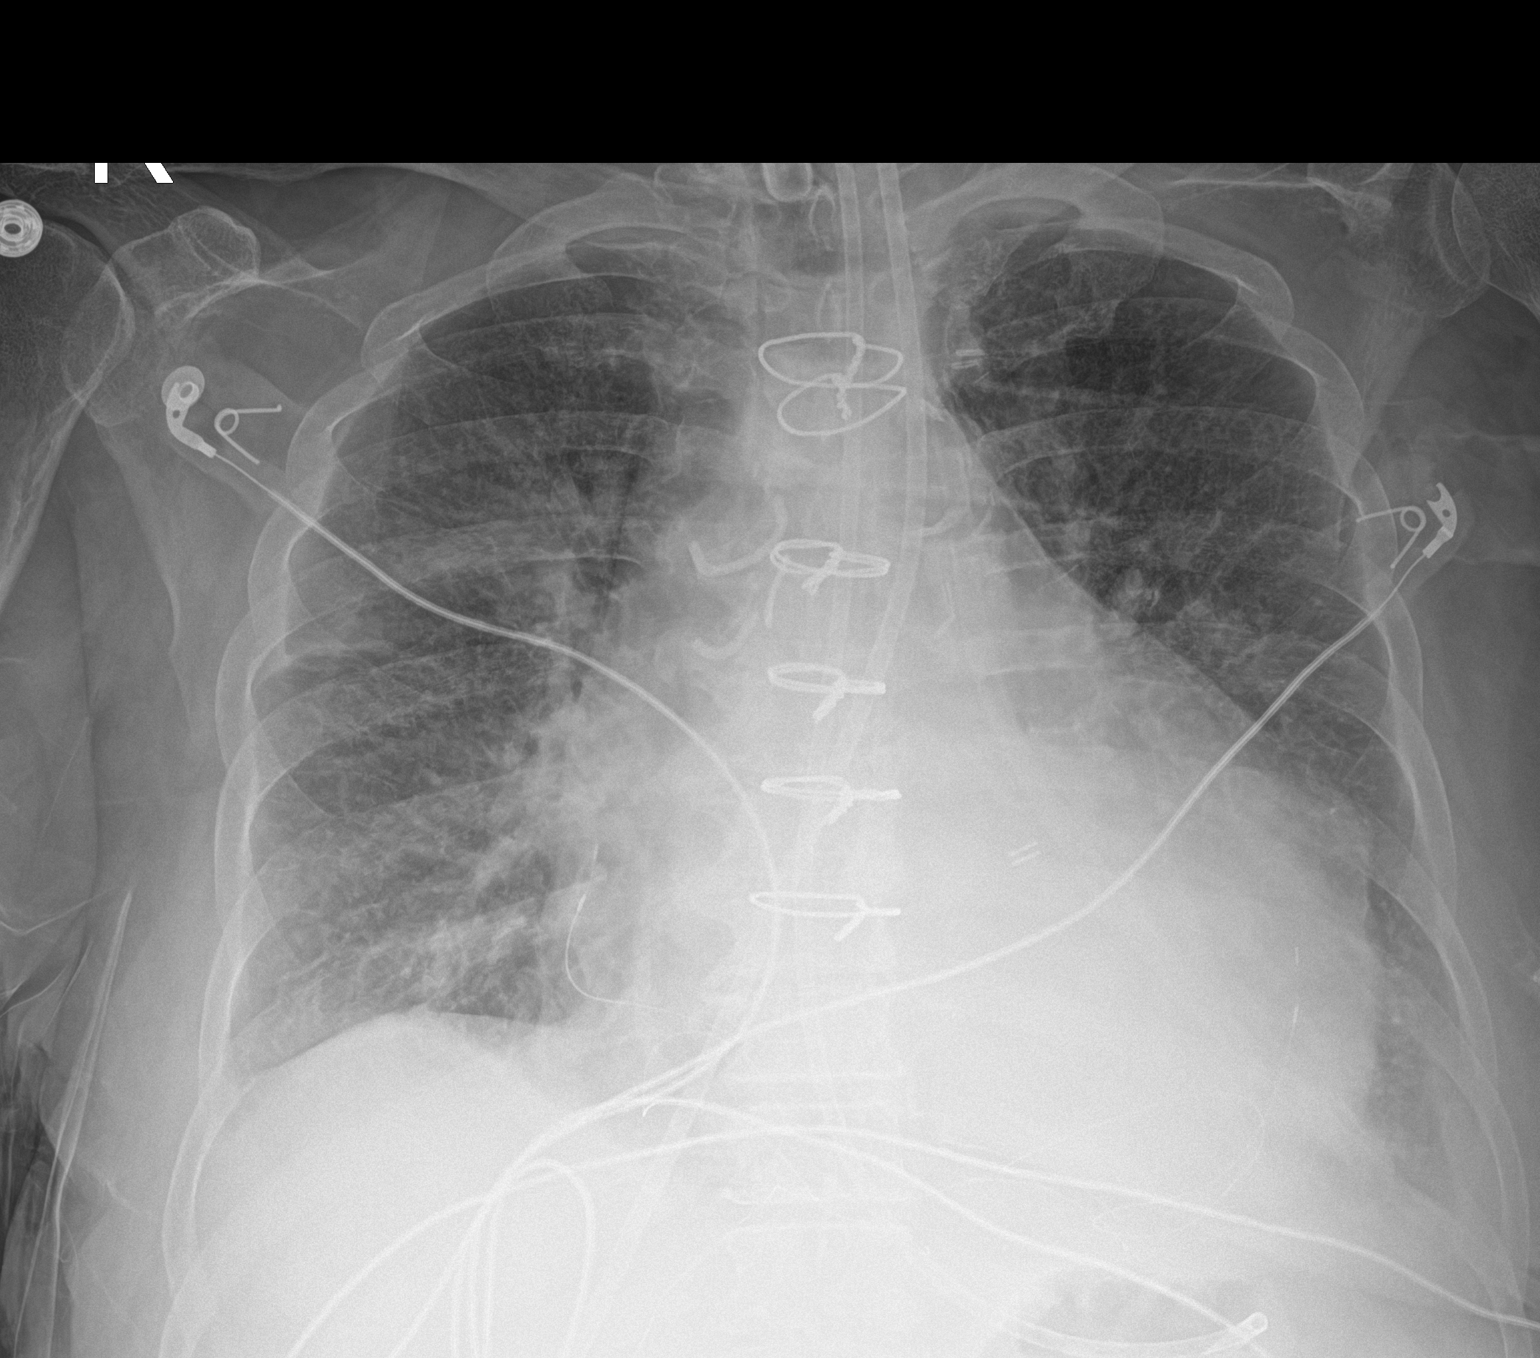

[1 of 1 positions shown; findings below may reference images not displayed]

FINDINGS: The heart size and mediastinal contours are enlarged as on prior
exam. There is prominence of the central pulmonary vasculature with
mildly increased interstitial markings throughout both lungs.
Dobbhoff tube is seen coursing below the diaphragm. Tracheostomy
tube seen at the level of the clavicular heads. Overlying median
sternotomy wires and aortic knob calcifications.
IMPRESSION: Cardiomegaly and interstitial edema

## 2020-06-03 NOTE — Consult Note (Signed)
Ref: Pcp, No   Subjective:  Increased respiratory distress last night. She is back on ventilator support.  Objective:  Vital Signs in the last 24 hours: BP:130/70  P: 93, R: 26, O2 Sat: 100 % on 30 % FiO2, IP 22 and PEEP 5.  Physical Exam: BP Readings from Last 1 Encounters:  No data found for BP     Wt Readings from Last 1 Encounters:  No data found for Wt    Weight change:  There is no height or weight on file to calculate BMI. HEENT: [place]/AT, Eyes-Blue, Conjunctiva-Pale Sclera-Non-icteric Neck: No JVD, No bruit, Trachea midline. Lungs: Rhonchi, bilateral. Cardiac:  Regular rhythm, normal S1 and S2, no S3. II/VI systolic murmur. Abdomen:  Soft, non-tender. BS present. Extremities:  1 + edema present. No cyanosis. No clubbing. CNS: AxOx1, Cranial nerves grossly intact.  Skin: Warm and dry.   Intake/Output from previous day: No intake/output data recorded.    Lab Results: BMET    Component Value Date/Time   NA 146 (H) 06/03/2020 0442   NA 145 06/02/2020 0507   K 4.0 06/03/2020 0442   K 3.7 06/02/2020 0507   CL 100 06/03/2020 0442   CL 99 06/02/2020 0507   CO2 36 (H) 06/03/2020 0442   CO2 34 (H) 06/02/2020 0507   GLUCOSE 150 (H) 06/03/2020 0442   GLUCOSE 154 (H) 06/02/2020 0507   BUN 56 (H) 06/03/2020 0442   BUN 50 (H) 06/02/2020 0507   CREATININE 1.09 (H) 06/03/2020 0442   CREATININE 1.03 (H) 06/02/2020 0507   CALCIUM 9.3 06/03/2020 0442   CALCIUM 9.2 06/02/2020 0507   GFRNONAA 58 (L) 06/03/2020 0442   GFRNONAA >60 06/02/2020 0507   CBC    Component Value Date/Time   WBC 17.8 (H) 06/03/2020 0442   RBC 2.87 (L) 06/03/2020 0442   HGB 8.2 (L) 06/03/2020 0442   HCT 28.4 (L) 06/03/2020 0442   PLT 309 06/03/2020 0442   MCV 99.0 06/03/2020 0442   MCH 28.6 06/03/2020 0442   MCHC 28.9 (L) 06/03/2020 0442   RDW 15.9 (H) 06/03/2020 0442   HEPATIC Function Panel Recent Labs    06/03/20 0442  PROT 7.0   HEMOGLOBIN A1C No components found for: HGA1C,   MPG CARDIAC ENZYMES No results found for: CKTOTAL, CKMB, CKMBINDEX, TROPONINI BNP No results for input(s): PROBNP in the last 8760 hours. TSH Recent Labs    06/03/20 0442  TSH 1.863   CHOLESTEROL No results for input(s): CHOL in the last 8760 hours.  Scheduled Meds: Continuous Infusions: PRN Meds:.  Assessment/Plan:  Acute on chronic respiratory failure with hypoxia CAD CABG HTN CHF, systolic, HFrEF Tobacco use disorder  Continue medical treatment.    LOS: 0 days   Time spent including chart review, lab review, examination, discussion with patient/nurse : 25 min   Orpah Cobb  MD  06/03/2020, 9:46 PM

## 2020-06-03 NOTE — Consult Note (Signed)
Referring Physician: Carron Curie, MD  Teresa [name] is an 59 y.o. female.                       Chief Complaint: Respiratory distress in patient with CABG  HPI: 59 years old female with PMH of HTN, hyperlipidemia, tobacco use disorder, seizure disorder, CAD had NSEMI. Post cardiac catheterization she was sent to OR for CABG x 4 on 04/25/2020. Patient had cardiogenic shock with significant blood loss requiring several units of PRBCs, inotropic support and CRRT. She had Impella removed on 05/07/2020 and tracheostomy on 05/10/2020. She is here for further management of acute on chronic respiratory failure. She is on T collar with 40 % FiO2.  Past Medical History:  Diagnosis Date  . Acute on chronic respiratory failure with hypoxia (HCC)   . Acute renal failure due to tubular necrosis (HCC)   . Atrial flutter (HCC)   . Cardiogenic shock (HCC)   . Coronary artery disease involving native coronary artery of native heart     PSH: as above.  The histories are not reviewed yet. Please review them in the "History" navigator section and refresh this SmartLink.  Family history: Non-contributroy.  Social History:  has history of 60 pack h/o smoking. She has no history of alcohol use, and drug use.  Allergies: Not on File  No medications prior to admission.  Aspirin 81 mg. Daily Amiodarone 200 mg. Daily Atorvastatin 40 mg. One daily. Metoprolol 25 mg. -1/2 tag twice daily. Levetiracetam 500 mg. One bid. Ipratropium-Albuterol Neb. Tx q 4 hr as needed. Famotidine 20 mg. bid  Results for orders placed or performed during the hospital encounter of 06/01/20 (from the past 48 hour(s))  CBC     Status: Abnormal   Collection Time: 06/02/20  5:07 AM  Result Value Ref Range   WBC 16.5 (H) 4.0 - 10.5 K/uL   RBC 2.94 (L) 3.87 - 5.11 MIL/uL   Hemoglobin 8.5 (L) 12.0 - 15.0 g/dL   HCT 31.5 (L) 36 - 46 %   MCV 98.6 80.0 - 100.0 fL   MCH 28.9 26.0 - 34.0 pg   MCHC 29.3 (L) 30.0 - 36.0 g/dL   RDW 40.0  (H) 86.7 - 15.5 %   Platelets 346 150 - 400 K/uL   nRBC 0.0 0.0 - 0.2 %    Comment: Performed at [facility], [address], [place], [place] [address]  Basic metabolic panel     Status: Abnormal   Collection Time: 06/02/20  5:07 AM  Result Value Ref Range   Sodium 145 135 - 145 mmol/L   Potassium 3.7 3.5 - 5.1 mmol/L   Chloride 99 98 - 111 mmol/L   CO2 34 (H) 22 - 32 mmol/L   Glucose, Bld 154 (H) 70 - 99 mg/dL    Comment: Glucose reference range applies only to samples taken after fasting for at least 8 hours.   BUN 50 (H) 6 - 20 mg/dL   Creatinine, Ser 9.32 (H) 0.44 - 1.00 mg/dL   Calcium 9.2 8.9 - 67.1 mg/dL   GFR, Estimated >24 >58 mL/min    Comment: (NOTE) Calculated using the CKD-EPI Creatinine Equation (2021)    Anion gap 12 5 - 15    Comment: Performed at [facility], [address], [place], [place] [address]  Culture, respiratory (non-expectorated)     Status: None (Preliminary result)   Collection Time: 06/02/20 11:43 AM   Specimen:  Tracheal Aspirate; Respiratory  Result Value Ref Range   Specimen Description TRACHEAL ASPIRATE    Special Requests NONE    Gram Stain      MODERATE WBC PRESENT, PREDOMINANTLY PMN NO ORGANISMS SEEN Performed at [facility], [address], [place], [place] [address]    Culture PENDING    Report Status PENDING   Urinalysis, Routine w reflex microscopic     Status: Abnormal   Collection Time: 06/02/20  6:00 PM  Result Value Ref Range   Color, Urine YELLOW YELLOW   APPearance CLOUDY (A) CLEAR   Specific Gravity, Urine 1.016 1.005 - 1.030   pH 5.0 5.0 - 8.0   Glucose, UA NEGATIVE NEGATIVE mg/dL   Hgb urine dipstick MODERATE (A) NEGATIVE   Bilirubin Urine NEGATIVE NEGATIVE   Ketones, ur NEGATIVE NEGATIVE mg/dL   Protein, ur 174 (A) NEGATIVE mg/dL   Nitrite NEGATIVE NEGATIVE   Leukocytes,Ua MODERATE (A) NEGATIVE   RBC / HPF 11-20 0 - 5 RBC/hpf   WBC, UA 6-10 0 - 5 WBC/hpf   Bacteria, UA RARE (A) NONE SEEN    Squamous Epithelial / LPF 0-5 0 - 5   Mucus PRESENT    Hyaline Casts, UA PRESENT    Granular Casts, UA PRESENT    Amorphous Crystal PRESENT     Comment: Performed at [facility], [address], [place], [place] [address]  Blood gas, arterial     Status: Abnormal   Collection Time: 06/03/20 12:07 AM  Result Value Ref Range   FIO2 40.00    pH, Arterial 7.165 (LL) 7.35 - 7.45    Comment: CRITICAL RESULT CALLED TO, READ BACK BY AND VERIFIED WITH: A CHAVIS,RN 06/03/2020 0033 WILDERK    pCO2 arterial 109 (HH) 32 - 48 mmHg    Comment: CRITICAL RESULT CALLED TO, READ BACK BY AND VERIFIED WITH: A CHAVIS,RN 06/03/2020 0033 WILDERK    pO2, Arterial 61.8 (L) 83 - 108 mmHg   Bicarbonate 38.7 (H) 20.0 - 28.0 mmol/L   Acid-Base Excess 9.8 (H) 0.0 - 2.0 mmol/L   O2 Saturation 89.0 %   Patient temperature 35.5    Collection site LEFT RADIAL    Drawn by COLLECTED BY RT     Comment: MARGEN BIHI,RRT   Sample type ARTERIAL DRAW    Allens test (pass/fail) PASS PASS    Comment: Performed at [facility], [address] [place], [place] [address]  Blood gas, arterial     Status: Abnormal   Collection Time: 06/03/20  3:39 AM  Result Value Ref Range   FIO2 30.00    pH, Arterial 7.428 7.35 - 7.45   pCO2 arterial 57.8 (H) 32 - 48 mmHg   pO2, Arterial 67.3 (L) 83 - 108 mmHg   Bicarbonate 37.5 (H) 20.0 - 28.0 mmol/L   Acid-Base Excess 12.5 (H) 0.0 - 2.0 mmol/L   O2 Saturation 94.9 %   Patient temperature 37.0    Collection site RIGHT RADIAL    Drawn by COLLECTED BY RT     Comment: STEVE JOHNSON,RRT   Sample type ARTERIAL DRAW    Allens test (pass/fail) PASS PASS    Comment: Performed at [facility], [address], [place], [place] [address]  CBC     Status: Abnormal   Collection Time: 06/03/20  4:42 AM  Result Value Ref Range   WBC 17.8 (H) 4.0 - 10.5 K/uL   RBC 2.87 (L) 3.87 - 5.11 MIL/uL  Hemoglobin 8.2 (L) 12.0 - 15.0 g/dL   HCT 19.6 (L) 36 - 46 %   MCV 99.0 80.0 -  100.0 fL   MCH 28.6 26.0 - 34.0 pg   MCHC 28.9 (L) 30.0 - 36.0 g/dL   RDW 22.2 (H) 97.9 - 89.2 %   Platelets 309 150 - 400 K/uL   nRBC 0.0 0.0 - 0.2 %    Comment: Performed at [facility], [address] [place], [place] [address]  Comprehensive metabolic panel     Status: Abnormal   Collection Time: 06/03/20  4:42 AM  Result Value Ref Range   Sodium 146 (H) 135 - 145 mmol/L   Potassium 4.0 3.5 - 5.1 mmol/L   Chloride 100 98 - 111 mmol/L   CO2 36 (H) 22 - 32 mmol/L   Glucose, Bld 150 (H) 70 - 99 mg/dL    Comment: Glucose reference range applies only to samples taken after fasting for at least 8 hours.   BUN 56 (H) 6 - 20 mg/dL   Creatinine, Ser 7.40 (H) 0.44 - 1.00 mg/dL   Calcium 9.3 8.9 - 81.4 mg/dL   Total Protein 7.0 6.5 - 8.1 g/dL   Albumin 2.5 (L) 3.5 - 5.0 g/dL   AST 27 15 - 41 U/L   ALT 29 0 - 44 U/L   Alkaline Phosphatase 97 38 - 126 U/L   Total Bilirubin 0.5 0.3 - 1.2 mg/dL   GFR, Estimated 58 (L) >60 mL/min    Comment: (NOTE) Calculated using the CKD-EPI Creatinine Equation (2021)    Anion gap 10 5 - 15    Comment: Performed at [facility], [address], [place], [place] [address]  Magnesium     Status: None   Collection Time: 06/03/20  4:42 AM  Result Value Ref Range   Magnesium 1.8 1.7 - 2.4 mg/dL    Comment: Performed at [facility], [address], [place], [place] [address]  Phosphorus     Status: None   Collection Time: 06/03/20  4:42 AM  Result Value Ref Range   Phosphorus 3.5 2.5 - 4.6 mg/dL    Comment: Performed at [facility], [address] [place], [place] [address]   DG Chest Port 1 View  Result Date: 06/03/2020 CLINICAL DATA:  Respiratory failure EXAM: PORTABLE CHEST 1 VIEW COMPARISON:  June 02, 2020 FINDINGS: The heart size and mediastinal contours are enlarged as on prior exam. There is prominence of the central pulmonary vasculature with mildly increased interstitial markings throughout both lungs. Dobbhoff  tube is seen coursing below the diaphragm. Tracheostomy tube seen at the level of the clavicular heads. Overlying median sternotomy wires and aortic knob calcifications. IMPRESSION: Cardiomegaly and interstitial edema Electronically Signed   By: Jonna Clark M.D.   On: 06/03/2020 01:08   DG CHEST PORT 1 VIEW  Result Date: 06/02/2020 CLINICAL DATA:  Difficulty breathing EXAM: PORTABLE CHEST 1 VIEW COMPARISON:  Chest radiograph and chest CT April 24, 2020. Abdominal radiograph June 01, 2020 FINDINGS: Tracheostomy catheter tip is 7.9 cm above the carina. Feeding tube tip is in the left upper quadrant. Based on abdomen radiograph from 1 day prior, tip is felt to be in the distal duodenum. 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 There is cardiomegaly with pulmonary venous hypertension. There is interstitial edema with subtle ill-defined opacity in the right upper lobe. There is aortic atherosclerosis. Status post coronary artery bypass grafting. No  adenopathy. No bone lesions. IMPRESSION: Tube positions as described without pneumothorax. There is cardiomegaly with pulmonary vascular congestion. There is mild interstitial edema. Question mild alveolar edema versus developing pneumonia right upper lobe. Patient is status post coronary artery bypass grafting. Aortic Atherosclerosis (ICD10-I70.0). Electronically Signed   By: Bretta BangWilliam  Woodruff III M.D.   On: 06/02/2020 13:00   DG Abd Portable 1V  Result Date: 06/01/2020 CLINICAL DATA:  NG tube placement EXAM: PORTABLE ABDOMEN - 1 VIEW COMPARISON:  None. FINDINGS: The tip of the feeding tube is likely located within the proximal jejunum. The visualized bowel gas pattern is unremarkable. There is a moderate amount of stool in the ascending colon. There are no radiopaque kidney stones. IMPRESSION: Feeding tube tip projects over the proximal jejunum. Electronically Signed   By: Katherine Mantlehristopher  Green M.D.   On: 06/01/2020 23:57    Review Of  Systems As per HPI  P: 97, R: 28, BP: 132/80, Sat: 98 %. T: 98.6 degree F. Fio2 40 %, T collar. There were no vitals taken for this visit. There is no height or weight on file to calculate BMI. General appearance: alert, cooperative, appears stated age and significant respiratory distress Head: Normocephalic, atraumatic. Eyes: Blue eyes, pale conjunctiva, corneas clear.  Neck: No adenopathy, no carotid bruit, no JVD, supple, symmetrical, tracheostomt tube in place. Resp: Scattered rhonchi to auscultation bilaterally. Cardio: Regular rate and rhythm, S1, S2 normal, II/VI systolic murmur, no click, rub or gallop GI: Soft, non-tender; bowel sounds normal; no organomegaly. Extremities: 1 + edema, no cyanosis or clubbing. Skin: Warm and dry.  Neurologic: Alert and oriented X 3, normal strength.   Assessment/Plan Acute on chronic respiratory failure with hypoxia CAD CABG HTN Chronic systolic left heart failure, HFrEF Hyperlipidemia Tobacco use disorder  Continue Atorvastatin, Metoprolol, Levetiracetam, Amiodarone and Aspirin.  Time spent: Review of old records, Lab, x-rays, EKG, other cardiac tests, examination, discussion with patient's nurse, tech and referring doctor over 70 minutes.  Ricki RodriguezAjay S Ayansh Feutz, MD  06/03/2020, 5:52 AM

## 2020-06-03 NOTE — Progress Notes (Addendum)
Pulmonary Critical Care Medicine [facility] [place]   PULMONARY CRITICAL CARE SERVICE  PROGRESS NOTE  Date of Service: 06/03/2020  Teresa [name]  VQM:[id number]  DOB: 10/20/1960   DOA: 06/01/2020  Referring Physician: Carron Curie, MD  HPI: Teresa [name] is a 59 y.o. female seen for follow up of Acute on Chronic Respiratory Failure.  Patient currently is on full support on the ventilator and pressure control mode has been on 30% FiO2 overnight had a poor ABG put back on the ventilator and follow-up ABG actually looks better.  Medications: Reviewed on Rounds  Physical Exam:  Vitals: Temperature is 96.9 pulse 93 respiratory 26 blood pressure is 139/72 saturations 100%  Ventilator Settings on pressure assist control FiO2 30% IP 22 PEEP 5  . General: Comfortable at this time . Eyes: Grossly normal lids, irises & conjunctiva . ENT: grossly tongue is normal . Neck: no obvious mass . Cardiovascular: S1 S2 normal no gallop . Respiratory: Scattered rhonchi expansion equal . Abdomen: soft . Skin: no rash seen on limited exam . Musculoskeletal: not rigid . Psychiatric:unable to assess . Neurologic: no seizure no involuntary movements         Lab Data:   Basic Metabolic Panel: Recent Labs  Lab 06/02/20 0507 06/03/20 0442  NA 145 146*  K 3.7 4.0  CL 99 100  CO2 34* 36*  GLUCOSE 154* 150*  BUN 50* 56*  CREATININE 1.03* 1.09*  CALCIUM 9.2 9.3  MG  --  1.8  PHOS  --  3.5    ABG: Recent Labs  Lab 06/03/20 0007 06/03/20 0339  PHART 7.165* 7.428  PCO2ART 109* 57.8*  PO2ART 61.8* 67.3*  HCO3 38.7* 37.5*  O2SAT 89.0 94.9    Liver Function Tests: Recent Labs  Lab 06/03/20 0442  AST 27  ALT 29  ALKPHOS 97  BILITOT 0.5  PROT 7.0  ALBUMIN 2.5*   No results for input(s): LIPASE, AMYLASE in the last 168 hours. No results for input(s): AMMONIA in the last 168 hours.  CBC: Recent Labs  Lab 06/02/20 0507 06/03/20 0442  WBC 16.5* 17.8*  HGB 8.5* 8.2*   HCT 29.0* 28.4*  MCV 98.6 99.0  PLT 346 309    Cardiac Enzymes: No results for input(s): CKTOTAL, CKMB, CKMBINDEX, TROPONINI in the last 168 hours.  BNP (last 3 results) No results for input(s): BNP in the last 8760 hours.  ProBNP (last 3 results) No results for input(s): PROBNP in the last 8760 hours.  Radiological Exams: DG Chest Port 1 View  Result Date: 06/03/2020 CLINICAL DATA:  Respiratory failure EXAM: PORTABLE CHEST 1 VIEW COMPARISON:  June 02, 2020 FINDINGS: The heart size and mediastinal contours are enlarged as on prior exam. There is prominence of the central pulmonary vasculature with mildly increased interstitial markings throughout both lungs. Dobbhoff tube is seen coursing below the diaphragm. Tracheostomy tube seen at the level of the clavicular heads. Overlying median sternotomy wires and aortic knob calcifications. IMPRESSION: Cardiomegaly and interstitial edema Electronically Signed   By: Jonna Clark M.D.   On: 06/03/2020 01:08   DG CHEST PORT 1 VIEW  Result Date: 06/02/2020 CLINICAL DATA:  Difficulty breathing EXAM: PORTABLE CHEST 1 VIEW COMPARISON:  Chest radiograph and chest CT April 24, 2020. Abdominal radiograph June 01, 2020 FINDINGS: Tracheostomy catheter tip is 7.9 cm above the carina. Feeding tube tip is in the left upper quadrant. Based on abdomen radiograph from 1 day prior, tip is felt to be in the distal  duodenum. 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 There is cardiomegaly with pulmonary venous hypertension. There is interstitial edema with subtle ill-defined opacity in the right upper lobe. There is aortic atherosclerosis. Status post coronary artery bypass grafting. No adenopathy. No bone lesions. IMPRESSION: Tube positions as described without pneumothorax. There is cardiomegaly with pulmonary vascular congestion. There is mild interstitial edema. Question mild alveolar edema versus developing pneumonia right upper lobe. Patient  is status post coronary artery bypass grafting. Aortic Atherosclerosis (ICD10-I70.0). Electronically Signed   By: Bretta Bang III M.D.   On: 06/02/2020 13:00   DG Abd Portable 1V  Result Date: 06/01/2020 CLINICAL DATA:  NG tube placement EXAM: PORTABLE ABDOMEN - 1 VIEW COMPARISON:  None. FINDINGS: The tip of the feeding tube is likely located within the proximal jejunum. The visualized bowel gas pattern is unremarkable. There is a moderate amount of stool in the ascending colon. There are no radiopaque kidney stones. IMPRESSION: Feeding tube tip projects over the proximal jejunum. Electronically Signed   By: Katherine Mantle M.D.   On: 06/01/2020 23:57    Assessment/Plan Active Problems:   Acute on chronic respiratory failure with hypoxia (HCC)   Atrial flutter (HCC)   Cardiogenic shock (HCC)   Coronary artery disease involving native coronary artery of native heart   Acute renal failure due to tubular necrosis (HCC)   1. Acute on chronic respiratory failure with hypoxia continues on the ventilator full support right now.  ABG looks better so we will try to continue with weaning as tolerated.  Cardiology has seen the patient appreciate input 2. Atrial flutter rate is controlled we will continue to follow 3. Cardiogenic shock resolved hemodynamics are stable 4. Coronary artery disease status post CABG 5. Acute renal failure tubular necrosis improving we will continue to monitor closely   I have personally seen and evaluated the patient, evaluated laboratory and imaging results, formulated the assessment and plan and placed orders. The Patient requires high complexity decision making with multiple systems involvement.  Rounds were done with the Respiratory Therapy Director and Staff therapists and discussed with nursing staff also.  Yevonne Pax, MD [facility] Pulmonary Critical Care Medicine Sleep Medicine

## 2020-06-04 ENCOUNTER — Other Ambulatory Visit (HOSPITAL_COMMUNITY): Payer: 59

## 2020-06-04 DIAGNOSIS — I483 Typical atrial flutter: Secondary | ICD-10-CM | POA: Diagnosis not present

## 2020-06-04 DIAGNOSIS — I2510 Atherosclerotic heart disease of native coronary artery without angina pectoris: Secondary | ICD-10-CM | POA: Diagnosis not present

## 2020-06-04 DIAGNOSIS — J9621 Acute and chronic respiratory failure with hypoxia: Secondary | ICD-10-CM | POA: Diagnosis not present

## 2020-06-04 DIAGNOSIS — N170 Acute kidney failure with tubular necrosis: Secondary | ICD-10-CM | POA: Diagnosis not present

## 2020-06-04 LAB — BLOOD GAS, ARTERIAL
Acid-Base Excess: 12.5 mmol/L — ABNORMAL HIGH (ref 0.0–2.0)
Bicarbonate: 36.3 mmol/L — ABNORMAL HIGH (ref 20.0–28.0)
FIO2: 30
O2 Saturation: 97.2 %
Patient temperature: 36.7
pCO2 arterial: 43.6 mmHg (ref 32.0–48.0)
pH, Arterial: 7.53 — ABNORMAL HIGH (ref 7.350–7.450)
pO2, Arterial: 80.1 mmHg — ABNORMAL LOW (ref 83.0–108.0)

## 2020-06-04 LAB — CBC
HCT: 23.6 % — ABNORMAL LOW (ref 36.0–46.0)
Hemoglobin: 7.2 g/dL — ABNORMAL LOW (ref 12.0–15.0)
MCH: 28.7 pg (ref 26.0–34.0)
MCHC: 30.5 g/dL (ref 30.0–36.0)
MCV: 94 fL (ref 80.0–100.0)
Platelets: 255 10*3/uL (ref 150–400)
RBC: 2.51 MIL/uL — ABNORMAL LOW (ref 3.87–5.11)
RDW: 16.1 % — ABNORMAL HIGH (ref 11.5–15.5)
WBC: 14.8 10*3/uL — ABNORMAL HIGH (ref 4.0–10.5)
nRBC: 0 % (ref 0.0–0.2)

## 2020-06-04 LAB — RENAL FUNCTION PANEL
Albumin: 2.4 g/dL — ABNORMAL LOW (ref 3.5–5.0)
Anion gap: 11 (ref 5–15)
BUN: 59 mg/dL — ABNORMAL HIGH (ref 6–20)
CO2: 32 mmol/L (ref 22–32)
Calcium: 8.9 mg/dL (ref 8.9–10.3)
Chloride: 100 mmol/L (ref 98–111)
Creatinine, Ser: 1.12 mg/dL — ABNORMAL HIGH (ref 0.44–1.00)
GFR, Estimated: 56 mL/min — ABNORMAL LOW (ref >60)
Glucose, Bld: 139 mg/dL — ABNORMAL HIGH (ref 70–99)
Phosphorus: 2.6 mg/dL (ref 2.5–4.6)
Potassium: 3.2 mmol/L — ABNORMAL LOW (ref 3.5–5.1)
Sodium: 143 mmol/L (ref 135–145)

## 2020-06-04 LAB — URINE CULTURE: Culture: 30000 — AB

## 2020-06-04 LAB — MAGNESIUM: Magnesium: 1.8 mg/dL (ref 1.7–2.4)

## 2020-06-04 IMAGING — DX DG CHEST 1V PORT
1 series · 1 of 1 positions shown · non-contrast
Comparison: Chest x-ray [DATE].

CLINICAL DATA: Pneumonia.

EXAM:
PORTABLE CHEST 1 VIEW

[chest ap]
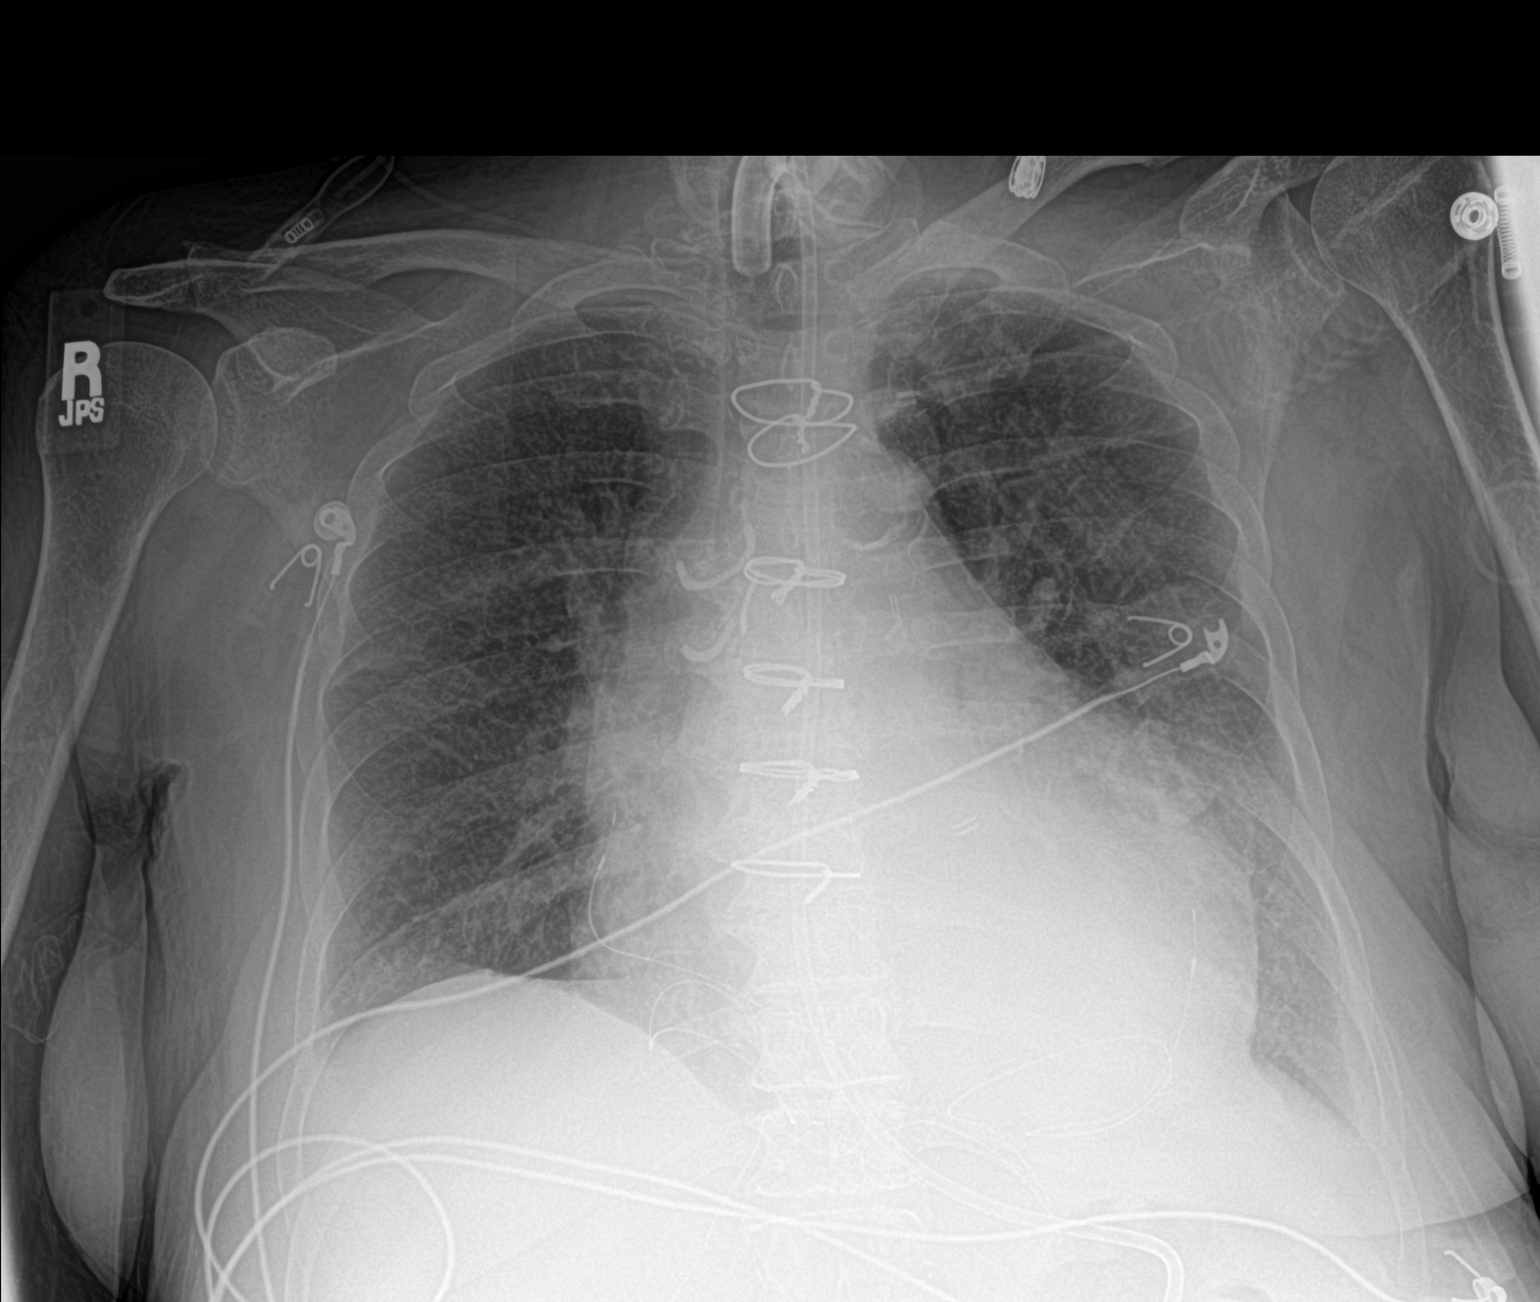

[1 of 1 positions shown; findings below may reference images not displayed]

FINDINGS: Tracheostomy tube and feeding tube in stable position. Prior CABG.
Cardiomegaly. Pulmonary venous congestion bilateral interstitial
prominence. Findings slightly improved from prior exam suggesting
improving CHF. Left lower lobe atelectasis/consolidation noted.
Pneumonia cannot be excluded. Small left pleural effusion again
noted. No pneumothorax.
IMPRESSION: 1. Tracheostomy tube and feeding tube in stable position.
2. Prior CABG. Cardiomegaly with pulmonary venous congestion and
bilateral interstitial prominence consistent with CHF. Interim
slight improvement from prior exam. Small left pleural effusion
again noted.
3. Left lower lobe atelectasis/consolidation noted. Pneumonia cannot
be excluded.

## 2020-06-04 NOTE — Progress Notes (Signed)
Pulmonary Critical Care Medicine [facility] [place]   PULMONARY CRITICAL CARE SERVICE  PROGRESS NOTE  Date of Service: 06/04/2020  Teresa [name]  MOL:[id number]  DOB: Sep 06, 1960   DOA: 06/01/2020  Referring Physician: Carron Curie, MD  HPI: Teresa [name] is a 59 y.o. female seen for follow up of Acute on Chronic Respiratory Failure.  Patient at this time is on full support and pressure control mode has been on FiO2 of 30%  Medications: Reviewed on Rounds  Physical Exam:  Vitals: Temperature is 99.2 pulse 86 respiratory rate 20 blood pressure is 114/54 saturations 98%  Ventilator Settings on pressure assist control FiO2 30% tidal volume is 4 3 IP 22 PEEP 5  . General: Comfortable at this time . Eyes: Grossly normal lids, irises & conjunctiva . ENT: grossly tongue is normal . Neck: no obvious mass . Cardiovascular: S1 S2 normal no gallop . Respiratory: No rhonchi very coarse breath sounds . Abdomen: soft . Skin: no rash seen on limited exam . Musculoskeletal: not rigid . Psychiatric:unable to assess . Neurologic: no seizure no involuntary movements         Lab Data:   Basic Metabolic Panel: Recent Labs  Lab 06/02/20 0507 06/03/20 0442 06/04/20 0440  NA 145 146* 143  K 3.7 4.0 3.2*  CL 99 100 100  CO2 34* 36* 32  GLUCOSE 154* 150* 139*  BUN 50* 56* 59*  CREATININE 1.03* 1.09* 1.12*  CALCIUM 9.2 9.3 8.9  MG  --  1.8 1.8  PHOS  --  3.5 2.6    ABG: Recent Labs  Lab 06/03/20 0007 06/03/20 0339 06/04/20 0909  PHART 7.165* 7.428 7.530*  PCO2ART 109* 57.8* 43.6  PO2ART 61.8* 67.3* 80.1*  HCO3 38.7* 37.5* 36.3*  O2SAT 89.0 94.9 97.2    Liver Function Tests: Recent Labs  Lab 06/03/20 0442 06/04/20 0440  AST 27  --   ALT 29  --   ALKPHOS 97  --   BILITOT 0.5  --   PROT 7.0  --   ALBUMIN 2.5* 2.4*   No results for input(s): LIPASE, AMYLASE in the last 168 hours. No results for input(s): AMMONIA in the last 168 hours.  CBC: Recent Labs   Lab 06/02/20 0507 06/03/20 0442 06/04/20 0440  WBC 16.5* 17.8* 14.8*  HGB 8.5* 8.2* 7.2*  HCT 29.0* 28.4* 23.6*  MCV 98.6 99.0 94.0  PLT 346 309 255    Cardiac Enzymes: No results for input(s): CKTOTAL, CKMB, CKMBINDEX, TROPONINI in the last 168 hours.  BNP (last 3 results) No results for input(s): BNP in the last 8760 hours.  ProBNP (last 3 results) No results for input(s): PROBNP in the last 8760 hours.  Radiological Exams: DG Chest Port 1 View  Result Date: 06/04/2020 CLINICAL DATA:  Pneumonia. EXAM: PORTABLE CHEST 1 VIEW COMPARISON:  Chest x-ray 06/03/2020. FINDINGS: Tracheostomy tube and feeding tube in stable position. Prior CABG. Cardiomegaly. Pulmonary venous congestion bilateral interstitial prominence. Findings slightly improved from prior exam suggesting improving CHF. Left lower lobe atelectasis/consolidation noted. Pneumonia cannot be excluded. Small left pleural effusion again noted. No pneumothorax. IMPRESSION: 1. Tracheostomy tube and feeding tube in stable position. 2. Prior CABG. Cardiomegaly with pulmonary venous congestion and bilateral interstitial prominence consistent with CHF. Interim slight improvement from prior exam. Small left pleural effusion again noted. 3. Left lower lobe atelectasis/consolidation noted. Pneumonia cannot be excluded. Electronically Signed   By: Maisie Fus  Register   On: 06/04/2020 06:23   DG Chest Port 1  View  Result Date: 06/03/2020 CLINICAL DATA:  Respiratory failure EXAM: PORTABLE CHEST 1 VIEW COMPARISON:  June 02, 2020 FINDINGS: The heart size and mediastinal contours are enlarged as on prior exam. There is prominence of the central pulmonary vasculature with mildly increased interstitial markings throughout both lungs. Dobbhoff tube is seen coursing below the diaphragm. Tracheostomy tube seen at the level of the clavicular heads. Overlying median sternotomy wires and aortic knob calcifications. IMPRESSION: Cardiomegaly and  interstitial edema Electronically Signed   By: Jonna Clark M.D.   On: 06/03/2020 01:08   DG CHEST PORT 1 VIEW  Result Date: 06/02/2020 CLINICAL DATA:  Difficulty breathing EXAM: PORTABLE CHEST 1 VIEW COMPARISON:  Chest radiograph and chest CT April 24, 2020. Abdominal radiograph June 01, 2020 FINDINGS: Tracheostomy catheter tip is 7.9 cm above the carina. Feeding tube tip is in the left upper quadrant. Based on abdomen radiograph from 1 day prior, tip is felt to be in the distal duodenum. 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 There is cardiomegaly with pulmonary venous hypertension. There is interstitial edema with subtle ill-defined opacity in the right upper lobe. There is aortic atherosclerosis. Status post coronary artery bypass grafting. No adenopathy. No bone lesions. IMPRESSION: Tube positions as described without pneumothorax. There is cardiomegaly with pulmonary vascular congestion. There is mild interstitial edema. Question mild alveolar edema versus developing pneumonia right upper lobe. Patient is status post coronary artery bypass grafting. Aortic Atherosclerosis (ICD10-I70.0). Electronically Signed   By: Bretta Bang III M.D.   On: 06/02/2020 13:00    Assessment/Plan Active Problems:   Acute on chronic respiratory failure with hypoxia (HCC)   Atrial flutter (HCC)   Cardiogenic shock (HCC)   Coronary artery disease involving native coronary artery of native heart   Acute renal failure due to tubular necrosis (HCC)   1. Acute on chronic respiratory failure with hypoxia we will continue with full support on the ventilator.  Mechanics are poor right now not able to move 2. Atrial flutter rate is controlled cardiology is following the patient 3. Cardiogenic shock hemodynamics Reitnauer stable 4. Coronary artery disease supportive care 5. Acute renal failure tubular necrosis resolved follow-up labs   I have personally seen and evaluated the patient,  evaluated laboratory and imaging results, formulated the assessment and plan and placed orders. The Patient requires high complexity decision making with multiple systems involvement.  Rounds were done with the Respiratory Therapy Director and Staff therapists and discussed with nursing staff also.  Yevonne Pax, MD [facility] Pulmonary Critical Care Medicine Sleep Medicine

## 2020-06-05 DIAGNOSIS — J9621 Acute and chronic respiratory failure with hypoxia: Secondary | ICD-10-CM | POA: Diagnosis not present

## 2020-06-05 DIAGNOSIS — N170 Acute kidney failure with tubular necrosis: Secondary | ICD-10-CM | POA: Diagnosis not present

## 2020-06-05 DIAGNOSIS — I2510 Atherosclerotic heart disease of native coronary artery without angina pectoris: Secondary | ICD-10-CM | POA: Diagnosis not present

## 2020-06-05 DIAGNOSIS — I483 Typical atrial flutter: Secondary | ICD-10-CM | POA: Diagnosis not present

## 2020-06-05 LAB — BLOOD GAS, ARTERIAL
Acid-Base Excess: 11.8 mmol/L — ABNORMAL HIGH (ref 0.0–2.0)
Bicarbonate: 36.2 mmol/L — ABNORMAL HIGH (ref 20.0–28.0)
FIO2: 30
O2 Saturation: 97.8 %
Patient temperature: 36.7
pCO2 arterial: 49.2 mmHg — ABNORMAL HIGH (ref 32.0–48.0)
pH, Arterial: 7.478 — ABNORMAL HIGH (ref 7.350–7.450)
pO2, Arterial: 92.5 mmHg (ref 83.0–108.0)

## 2020-06-05 LAB — RENAL FUNCTION PANEL
Albumin: 2.3 g/dL — ABNORMAL LOW (ref 3.5–5.0)
Anion gap: 9 (ref 5–15)
BUN: 68 mg/dL — ABNORMAL HIGH (ref 6–20)
CO2: 32 mmol/L (ref 22–32)
Calcium: 8.7 mg/dL — ABNORMAL LOW (ref 8.9–10.3)
Chloride: 100 mmol/L (ref 98–111)
Creatinine, Ser: 1.04 mg/dL — ABNORMAL HIGH (ref 0.44–1.00)
GFR, Estimated: >60 mL/min (ref >60)
Glucose, Bld: 132 mg/dL — ABNORMAL HIGH (ref 70–99)
Phosphorus: 3.3 mg/dL (ref 2.5–4.6)
Potassium: 4.3 mmol/L (ref 3.5–5.1)
Sodium: 141 mmol/L (ref 135–145)

## 2020-06-05 LAB — CBC
HCT: 24.6 % — ABNORMAL LOW (ref 36.0–46.0)
Hemoglobin: 7.4 g/dL — ABNORMAL LOW (ref 12.0–15.0)
MCH: 28.8 pg (ref 26.0–34.0)
MCHC: 30.1 g/dL (ref 30.0–36.0)
MCV: 95.7 fL (ref 80.0–100.0)
Platelets: 286 10*3/uL (ref 150–400)
RBC: 2.57 MIL/uL — ABNORMAL LOW (ref 3.87–5.11)
RDW: 16.2 % — ABNORMAL HIGH (ref 11.5–15.5)
WBC: 12.6 10*3/uL — ABNORMAL HIGH (ref 4.0–10.5)
nRBC: 0 % (ref 0.0–0.2)

## 2020-06-05 LAB — BASIC METABOLIC PANEL
Anion gap: 12 (ref 5–15)
BUN: 66 mg/dL — ABNORMAL HIGH (ref 6–20)
CO2: 31 mmol/L (ref 22–32)
Calcium: 8.8 mg/dL — ABNORMAL LOW (ref 8.9–10.3)
Chloride: 98 mmol/L (ref 98–111)
Creatinine, Ser: 0.99 mg/dL (ref 0.44–1.00)
GFR, Estimated: >60 mL/min (ref >60)
Glucose, Bld: 131 mg/dL — ABNORMAL HIGH (ref 70–99)
Potassium: 4.2 mmol/L (ref 3.5–5.1)
Sodium: 141 mmol/L (ref 135–145)

## 2020-06-05 LAB — MAGNESIUM: Magnesium: 1.8 mg/dL (ref 1.7–2.4)

## 2020-06-05 NOTE — Progress Notes (Signed)
Pulmonary Critical Care Medicine [facility] [place]   PULMONARY CRITICAL CARE SERVICE  PROGRESS NOTE  Date of Service: 06/05/2020  Teresa [name]  QVZ:[id number]  DOB: 12/20/1960   DOA: 06/01/2020  Referring Physician: Carron Curie, MD  HPI: Teresa [name] is a 59 y.o. female seen for follow up of Acute on Chronic Respiratory Failure.  Patient is comfortable right now without distress remains on pressure support mode requiring 30% FiO2  Medications: Reviewed on Rounds  Physical Exam:  Vitals: Temperature 98.1 pulse 80 respiratory 23 blood pressure is 152/70 saturations 100%  Ventilator Settings on pressure support FiO2 30% tidal volume 478 pressure 12/5  . General: Comfortable at this time . Eyes: Grossly normal lids, irises & conjunctiva . ENT: grossly tongue is normal . Neck: no obvious mass . Cardiovascular: S1 S2 normal no gallop . Respiratory: Scattered rhonchi expansion equal . Abdomen: soft . Skin: no rash seen on limited exam . Musculoskeletal: not rigid . Psychiatric:unable to assess . Neurologic: no seizure no involuntary movements         Lab Data:   Basic Metabolic Panel: Recent Labs  Lab 06/02/20 0507 06/03/20 0442 06/04/20 0440  NA 145 146* 143  K 3.7 4.0 3.2*  CL 99 100 100  CO2 34* 36* 32  GLUCOSE 154* 150* 139*  BUN 50* 56* 59*  CREATININE 1.03* 1.09* 1.12*  CALCIUM 9.2 9.3 8.9  MG  --  1.8 1.8  PHOS  --  3.5 2.6    ABG: Recent Labs  Lab 06/03/20 0007 06/03/20 0339 06/04/20 0909 06/05/20 0520  PHART 7.165* 7.428 7.530* 7.478*  PCO2ART 109* 57.8* 43.6 49.2*  PO2ART 61.8* 67.3* 80.1* 92.5  HCO3 38.7* 37.5* 36.3* 36.2*  O2SAT 89.0 94.9 97.2 97.8    Liver Function Tests: Recent Labs  Lab 06/03/20 0442 06/04/20 0440  AST 27  --   ALT 29  --   ALKPHOS 97  --   BILITOT 0.5  --   PROT 7.0  --   ALBUMIN 2.5* 2.4*   No results for input(s): LIPASE, AMYLASE in the last 168 hours. No results for input(s): AMMONIA in the  last 168 hours.  CBC: Recent Labs  Lab 06/02/20 0507 06/03/20 0442 06/04/20 0440 06/05/20 1238  WBC 16.5* 17.8* 14.8* 12.6*  HGB 8.5* 8.2* 7.2* 7.4*  HCT 29.0* 28.4* 23.6* 24.6*  MCV 98.6 99.0 94.0 95.7  PLT 346 309 255 286    Cardiac Enzymes: No results for input(s): CKTOTAL, CKMB, CKMBINDEX, TROPONINI in the last 168 hours.  BNP (last 3 results) No results for input(s): BNP in the last 8760 hours.  ProBNP (last 3 results) No results for input(s): PROBNP in the last 8760 hours.  Radiological Exams: DG Chest Port 1 View  Result Date: 06/04/2020 CLINICAL DATA:  Pneumonia. EXAM: PORTABLE CHEST 1 VIEW COMPARISON:  Chest x-ray 06/03/2020. FINDINGS: Tracheostomy tube and feeding tube in stable position. Prior CABG. Cardiomegaly. Pulmonary venous congestion bilateral interstitial prominence. Findings slightly improved from prior exam suggesting improving CHF. Left lower lobe atelectasis/consolidation noted. Pneumonia cannot be excluded. Small left pleural effusion again noted. No pneumothorax. IMPRESSION: 1. Tracheostomy tube and feeding tube in stable position. 2. Prior CABG. Cardiomegaly with pulmonary venous congestion and bilateral interstitial prominence consistent with CHF. Interim slight improvement from prior exam. Small left pleural effusion again noted. 3. Left lower lobe atelectasis/consolidation noted. Pneumonia cannot be excluded. Electronically Signed   By: Maisie Fus  Register   On: 06/04/2020 06:23    Assessment/Plan  Active Problems:   Acute on chronic respiratory failure with hypoxia (HCC)   Atrial flutter (HCC)   Cardiogenic shock (HCC)   Coronary artery disease involving native coronary artery of native heart   Acute renal failure due to tubular necrosis (HCC)   1. Acute on chronic respiratory failure hypoxia we will continue with pressure support mode titrate oxygen continue pulmonary toilet. 2. Atrial flutter rate is controlled at this time 3. Cardiogenic shock  no change continue supportive care 4. Coronary artery disease at baseline 5. Acute renal failure labs have resolved we will continue to monitor   I have personally seen and evaluated the patient, evaluated laboratory and imaging results, formulated the assessment and plan and placed orders. The Patient requires high complexity decision making with multiple systems involvement.  Rounds were done with the Respiratory Therapy Director and Staff therapists and discussed with nursing staff also.  Yevonne Pax, MD [facility] Pulmonary Critical Care Medicine Sleep Medicine

## 2020-06-06 DIAGNOSIS — I483 Typical atrial flutter: Secondary | ICD-10-CM | POA: Diagnosis not present

## 2020-06-06 DIAGNOSIS — N170 Acute kidney failure with tubular necrosis: Secondary | ICD-10-CM | POA: Diagnosis not present

## 2020-06-06 DIAGNOSIS — I2510 Atherosclerotic heart disease of native coronary artery without angina pectoris: Secondary | ICD-10-CM | POA: Diagnosis not present

## 2020-06-06 DIAGNOSIS — J9621 Acute and chronic respiratory failure with hypoxia: Secondary | ICD-10-CM | POA: Diagnosis not present

## 2020-06-06 LAB — CBC
HCT: 24.2 % — ABNORMAL LOW (ref 36.0–46.0)
Hemoglobin: 7.2 g/dL — ABNORMAL LOW (ref 12.0–15.0)
MCH: 28.3 pg (ref 26.0–34.0)
MCHC: 29.8 g/dL — ABNORMAL LOW (ref 30.0–36.0)
MCV: 95.3 fL (ref 80.0–100.0)
Platelets: 279 10*3/uL (ref 150–400)
RBC: 2.54 MIL/uL — ABNORMAL LOW (ref 3.87–5.11)
RDW: 16.2 % — ABNORMAL HIGH (ref 11.5–15.5)
WBC: 11.6 10*3/uL — ABNORMAL HIGH (ref 4.0–10.5)
nRBC: 0 % (ref 0.0–0.2)

## 2020-06-06 LAB — CULTURE, RESPIRATORY W GRAM STAIN

## 2020-06-06 LAB — BASIC METABOLIC PANEL
Anion gap: 8 (ref 5–15)
BUN: 55 mg/dL — ABNORMAL HIGH (ref 6–20)
CO2: 33 mmol/L — ABNORMAL HIGH (ref 22–32)
Calcium: 9 mg/dL (ref 8.9–10.3)
Chloride: 101 mmol/L (ref 98–111)
Creatinine, Ser: 0.86 mg/dL (ref 0.44–1.00)
GFR, Estimated: >60 mL/min (ref >60)
Glucose, Bld: 129 mg/dL — ABNORMAL HIGH (ref 70–99)
Potassium: 3.7 mmol/L (ref 3.5–5.1)
Sodium: 142 mmol/L (ref 135–145)

## 2020-06-06 NOTE — Progress Notes (Signed)
Pulmonary Critical Care Medicine [facility] [place]   PULMONARY CRITICAL CARE SERVICE  PROGRESS NOTE  Date of Service: 06/06/2020  Teresa [name]  [id number]  DOB: May 12, 1961   DOA: 06/01/2020  Referring Physician: Carron Curie, MD  HPI: Teresa [name] is a 59 y.o. female seen for follow up of Acute on Chronic Respiratory Failure.  Patient currently is on T collar goal today is for 2 hours otherwise resting on pressure support  Medications: Reviewed on Rounds  Physical Exam:  Vitals: Temperature is 98.0 pulse 93 respiratory 20 blood pressure is 144/75 saturations 99%  Ventilator Settings on T collar 28%  . General: Comfortable at this time . Eyes: Grossly normal lids, irises & conjunctiva . ENT: grossly tongue is normal . Neck: no obvious mass . Cardiovascular: S1 S2 normal no gallop . Respiratory: Scattered rhonchi coarse breath sounds . Abdomen: soft . Skin: no rash seen on limited exam . Musculoskeletal: not rigid . Psychiatric:unable to assess . Neurologic: no seizure no involuntary movements         Lab Data:   Basic Metabolic Panel: Recent Labs  Lab 06/02/20 0507 06/03/20 0442 06/04/20 0440 06/05/20 1238 06/06/20 0708  NA 145 146* 143 141  141 142  K 3.7 4.0 3.2* 4.2  4.3 3.7  CL 99 100 100 98  100 101  CO2 34* 36* 32 31  32 33*  GLUCOSE 154* 150* 139* 131*  132* 129*  BUN 50* 56* 59* 66*  68* 55*  CREATININE 1.03* 1.09* 1.12* 0.99  1.04* 0.86  CALCIUM 9.2 9.3 8.9 8.8*  8.7* 9.0  MG  --  1.8 1.8 1.8  --   PHOS  --  3.5 2.6 3.3  --     ABG: Recent Labs  Lab 06/03/20 0007 06/03/20 0339 06/04/20 0909 06/05/20 0520  PHART 7.165* 7.428 7.530* 7.478*  PCO2ART 109* 57.8* 43.6 49.2*  PO2ART 61.8* 67.3* 80.1* 92.5  HCO3 38.7* 37.5* 36.3* 36.2*  O2SAT 89.0 94.9 97.2 97.8    Liver Function Tests: Recent Labs  Lab 06/03/20 0442 06/04/20 0440 06/05/20 1238  AST 27  --   --   ALT 29  --   --   ALKPHOS 97  --   --   BILITOT  0.5  --   --   PROT 7.0  --   --   ALBUMIN 2.5* 2.4* 2.3*   No results for input(s): LIPASE, AMYLASE in the last 168 hours. No results for input(s): AMMONIA in the last 168 hours.  CBC: Recent Labs  Lab 06/02/20 0507 06/03/20 0442 06/04/20 0440 06/05/20 1238 06/06/20 0708  WBC 16.5* 17.8* 14.8* 12.6* 11.6*  HGB 8.5* 8.2* 7.2* 7.4* 7.2*  HCT 29.0* 28.4* 23.6* 24.6* 24.2*  MCV 98.6 99.0 94.0 95.7 95.3  PLT 346 309 255 286 279    Cardiac Enzymes: No results for input(s): CKTOTAL, CKMB, CKMBINDEX, TROPONINI in the last 168 hours.  BNP (last 3 results) No results for input(s): BNP in the last 8760 hours.  ProBNP (last 3 results) No results for input(s): PROBNP in the last 8760 hours.  Radiological Exams: No results found.  Assessment/Plan Active Problems:   Acute on chronic respiratory failure with hypoxia (HCC)   Atrial flutter (HCC)   Cardiogenic shock (HCC)   Coronary artery disease involving native coronary artery of native heart   Acute renal failure due to tubular necrosis (HCC)   1. Acute on chronic respiratory failure hypoxia we will continue with the weaning  patient is supposed to do 2 hours of T collar 2. Atrial flutter rate is controlled we will continue supportive care 3. Cardiogenic shock hemodynamics are stable cardiology following 4. Coronary artery disease at baseline 5. Acute renal failure no change continue with supportive care   I have personally seen and evaluated the patient, evaluated laboratory and imaging results, formulated the assessment and plan and placed orders. The Patient requires high complexity decision making with multiple systems involvement.  Rounds were done with the Respiratory Therapy Director and Staff therapists and discussed with nursing staff also.  Yevonne Pax, MD [facility] Pulmonary Critical Care Medicine Sleep Medicine

## 2020-06-07 DIAGNOSIS — I2510 Atherosclerotic heart disease of native coronary artery without angina pectoris: Secondary | ICD-10-CM | POA: Diagnosis not present

## 2020-06-07 DIAGNOSIS — N170 Acute kidney failure with tubular necrosis: Secondary | ICD-10-CM | POA: Diagnosis not present

## 2020-06-07 DIAGNOSIS — J9621 Acute and chronic respiratory failure with hypoxia: Secondary | ICD-10-CM | POA: Diagnosis not present

## 2020-06-07 DIAGNOSIS — I483 Typical atrial flutter: Secondary | ICD-10-CM | POA: Diagnosis not present

## 2020-06-07 LAB — LEVETIRACETAM LEVEL: Levetiracetam Lvl: 29.4 ug/mL (ref 10.0–40.0)

## 2020-06-07 LAB — VANCOMYCIN, TROUGH: Vancomycin Tr: 12 ug/mL — ABNORMAL LOW (ref 15–20)

## 2020-06-07 NOTE — Progress Notes (Signed)
Pulmonary Critical Care Medicine [facility] [place]   PULMONARY CRITICAL CARE SERVICE  PROGRESS NOTE  Date of Service: 06/07/2020  Teresa [name]  [id number]  DOB: 1960/09/14   DOA: 06/01/2020  Referring Physician: Carron Curie, MD  HPI: Teresa [name] is a 59 y.o. female seen for follow up of Acute on Chronic Respiratory Failure.  Patient right now is on T collar currently on 28% FiO2 with a goal of 4 hours  Medications: Reviewed on Rounds  Physical Exam:  Vitals: Temperature is 97.8 pulse 77 respiratory rate 20 blood pressure is 134/72 saturations 98%  Ventilator Settings on T collar FiO2 is 28%  . General: Comfortable at this time . Eyes: Grossly normal lids, irises & conjunctiva . ENT: grossly tongue is normal . Neck: no obvious mass . Cardiovascular: S1 S2 normal no gallop . Respiratory: No rhonchi very coarse breath sounds . Abdomen: soft . Skin: no rash seen on limited exam . Musculoskeletal: not rigid . Psychiatric:unable to assess . Neurologic: no seizure no involuntary movements         Lab Data:   Basic Metabolic Panel: Recent Labs  Lab 06/02/20 0507 06/03/20 0442 06/04/20 0440 06/05/20 1238 06/06/20 0708  NA 145 146* 143 141  141 142  K 3.7 4.0 3.2* 4.2  4.3 3.7  CL 99 100 100 98  100 101  CO2 34* 36* 32 31  32 33*  GLUCOSE 154* 150* 139* 131*  132* 129*  BUN 50* 56* 59* 66*  68* 55*  CREATININE 1.03* 1.09* 1.12* 0.99  1.04* 0.86  CALCIUM 9.2 9.3 8.9 8.8*  8.7* 9.0  MG  --  1.8 1.8 1.8  --   PHOS  --  3.5 2.6 3.3  --     ABG: Recent Labs  Lab 06/03/20 0007 06/03/20 0339 06/04/20 0909 06/05/20 0520  PHART 7.165* 7.428 7.530* 7.478*  PCO2ART 109* 57.8* 43.6 49.2*  PO2ART 61.8* 67.3* 80.1* 92.5  HCO3 38.7* 37.5* 36.3* 36.2*  O2SAT 89.0 94.9 97.2 97.8    Liver Function Tests: Recent Labs  Lab 06/03/20 0442 06/04/20 0440 06/05/20 1238  AST 27  --   --   ALT 29  --   --   ALKPHOS 97  --   --   BILITOT 0.5  --    --   PROT 7.0  --   --   ALBUMIN 2.5* 2.4* 2.3*   No results for input(s): LIPASE, AMYLASE in the last 168 hours. No results for input(s): AMMONIA in the last 168 hours.  CBC: Recent Labs  Lab 06/02/20 0507 06/03/20 0442 06/04/20 0440 06/05/20 1238 06/06/20 0708  WBC 16.5* 17.8* 14.8* 12.6* 11.6*  HGB 8.5* 8.2* 7.2* 7.4* 7.2*  HCT 29.0* 28.4* 23.6* 24.6* 24.2*  MCV 98.6 99.0 94.0 95.7 95.3  PLT 346 309 255 286 279    Cardiac Enzymes: No results for input(s): CKTOTAL, CKMB, CKMBINDEX, TROPONINI in the last 168 hours.  BNP (last 3 results) No results for input(s): BNP in the last 8760 hours.  ProBNP (last 3 results) No results for input(s): PROBNP in the last 8760 hours.  Radiological Exams: No results found.  Assessment/Plan Active Problems:   Acute on chronic respiratory failure with hypoxia (HCC)   Atrial flutter (HCC)   Cardiogenic shock (HCC)   Coronary artery disease involving native coronary artery of native heart   Acute renal failure due to tubular necrosis (HCC)   1. Acute on chronic respiratory failure hypoxia we will  continue with weaning on T collar today's goal is 4 hours 2. Atrial flutter rate controlled at this time 3. Cardiogenic shock resolved 4. Coronary artery disease supportive care 5. Acute tubular necrosis at baseline we will continue to monitor   I have personally seen and evaluated the patient, evaluated laboratory and imaging results, formulated the assessment and plan and placed orders. The Patient requires high complexity decision making with multiple systems involvement.  Rounds were done with the Respiratory Therapy Director and Staff therapists and discussed with nursing staff also.  Yevonne Pax, MD [facility] Pulmonary Critical Care Medicine Sleep Medicine

## 2020-06-08 DIAGNOSIS — N170 Acute kidney failure with tubular necrosis: Secondary | ICD-10-CM | POA: Diagnosis not present

## 2020-06-08 DIAGNOSIS — I2510 Atherosclerotic heart disease of native coronary artery without angina pectoris: Secondary | ICD-10-CM | POA: Diagnosis not present

## 2020-06-08 DIAGNOSIS — I483 Typical atrial flutter: Secondary | ICD-10-CM | POA: Diagnosis not present

## 2020-06-08 DIAGNOSIS — J9621 Acute and chronic respiratory failure with hypoxia: Secondary | ICD-10-CM | POA: Diagnosis not present

## 2020-06-08 LAB — CULTURE, BLOOD (ROUTINE X 2)
Culture: NO GROWTH
Culture: NO GROWTH
Special Requests: ADEQUATE

## 2020-06-08 NOTE — Progress Notes (Addendum)
Pulmonary Critical Care Medicine [facility] [place]   PULMONARY CRITICAL CARE SERVICE  PROGRESS NOTE  Date of Service: 06/08/2020  Teresa [name]  [id number]  DOB: 28-May-1961   DOA: 06/01/2020  Referring Physician: Carron Curie, MD  HPI: Teresa [name] is a 59 y.o. female seen for follow up of Acute on Chronic Respiratory Failure.  Patient is on pressure support has been on a pressure of 12/5 with a 16-hour goal  Medications: Reviewed on Rounds  Physical Exam:  Vitals: Temperature is 95.4 pulse of 84 respiratory rate 17 pressure is 125/68 saturations 97%  Ventilator Settings on pressure support FiO2 28% pressure 12/5  . General: Comfortable at this time . Eyes: Grossly normal lids, irises & conjunctiva . ENT: grossly tongue is normal . Neck: no obvious mass . Cardiovascular: S1 S2 normal no gallop . Respiratory: Scattered rhonchi expansion is equal . Abdomen: soft . Skin: no rash seen on limited exam . Musculoskeletal: not rigid . Psychiatric:unable to assess . Neurologic: no seizure no involuntary movements         Lab Data:   Basic Metabolic Panel: Recent Labs  Lab 06/02/20 0507 06/03/20 0442 06/04/20 0440 06/05/20 1238 06/06/20 0708  NA 145 146* 143 141  141 142  K 3.7 4.0 3.2* 4.2  4.3 3.7  CL 99 100 100 98  100 101  CO2 34* 36* 32 31  32 33*  GLUCOSE 154* 150* 139* 131*  132* 129*  BUN 50* 56* 59* 66*  68* 55*  CREATININE 1.03* 1.09* 1.12* 0.99  1.04* 0.86  CALCIUM 9.2 9.3 8.9 8.8*  8.7* 9.0  MG  --  1.8 1.8 1.8  --   PHOS  --  3.5 2.6 3.3  --     ABG: Recent Labs  Lab 06/03/20 0007 06/03/20 0339 06/04/20 0909 06/05/20 0520  PHART 7.165* 7.428 7.530* 7.478*  PCO2ART 109* 57.8* 43.6 49.2*  PO2ART 61.8* 67.3* 80.1* 92.5  HCO3 38.7* 37.5* 36.3* 36.2*  O2SAT 89.0 94.9 97.2 97.8    Liver Function Tests: Recent Labs  Lab 06/03/20 0442 06/04/20 0440 06/05/20 1238  AST 27  --   --   ALT 29  --   --   ALKPHOS 97  --   --    BILITOT 0.5  --   --   PROT 7.0  --   --   ALBUMIN 2.5* 2.4* 2.3*   No results for input(s): LIPASE, AMYLASE in the last 168 hours. No results for input(s): AMMONIA in the last 168 hours.  CBC: Recent Labs  Lab 06/02/20 0507 06/03/20 0442 06/04/20 0440 06/05/20 1238 06/06/20 0708  WBC 16.5* 17.8* 14.8* 12.6* 11.6*  HGB 8.5* 8.2* 7.2* 7.4* 7.2*  HCT 29.0* 28.4* 23.6* 24.6* 24.2*  MCV 98.6 99.0 94.0 95.7 95.3  PLT 346 309 255 286 279    Cardiac Enzymes: No results for input(s): CKTOTAL, CKMB, CKMBINDEX, TROPONINI in the last 168 hours.  BNP (last 3 results) No results for input(s): BNP in the last 8760 hours.  ProBNP (last 3 results) No results for input(s): PROBNP in the last 8760 hours.  Radiological Exams: No results found.  Assessment/Plan Active Problems:   Acute on chronic respiratory failure with hypoxia (HCC)   Atrial flutter (HCC)   Cardiogenic shock (HCC)   Coronary artery disease involving native coronary artery of native heart   Acute renal failure due to tubular necrosis (HCC)   1. Acute on chronic respiratory failure with hypoxia patient remains  on pressure support right now on 28% FiO2 with a goal of 16 hours 2. Atrial flutter rate is controlled 3. Cardiogenic shock at baseline supportive care 4. Coronary artery disease no change 5. Acute renal failure tubular necrosis labs are at baseline   I have personally seen and evaluated the patient, evaluated laboratory and imaging results, formulated the assessment and plan and placed orders. The Patient requires high complexity decision making with multiple systems involvement.  Rounds were done with the Respiratory Therapy Director and Staff therapists and discussed with nursing staff also.  Yevonne Pax, MD [facility] Pulmonary Critical Care Medicine Sleep Medicine

## 2020-06-10 DIAGNOSIS — J9621 Acute and chronic respiratory failure with hypoxia: Secondary | ICD-10-CM | POA: Diagnosis not present

## 2020-06-10 DIAGNOSIS — I483 Typical atrial flutter: Secondary | ICD-10-CM | POA: Diagnosis not present

## 2020-06-10 DIAGNOSIS — I2510 Atherosclerotic heart disease of native coronary artery without angina pectoris: Secondary | ICD-10-CM | POA: Diagnosis not present

## 2020-06-10 DIAGNOSIS — N170 Acute kidney failure with tubular necrosis: Secondary | ICD-10-CM | POA: Diagnosis not present

## 2020-06-10 LAB — CBC
HCT: 22.5 % — ABNORMAL LOW (ref 36.0–46.0)
Hemoglobin: 6.9 g/dL — CL (ref 12.0–15.0)
MCH: 28.9 pg (ref 26.0–34.0)
MCHC: 30.7 g/dL (ref 30.0–36.0)
MCV: 94.1 fL (ref 80.0–100.0)
Platelets: 266 10*3/uL (ref 150–400)
RBC: 2.39 MIL/uL — ABNORMAL LOW (ref 3.87–5.11)
RDW: 15.9 % — ABNORMAL HIGH (ref 11.5–15.5)
WBC: 10.5 10*3/uL (ref 4.0–10.5)
nRBC: 0 % (ref 0.0–0.2)

## 2020-06-10 LAB — BASIC METABOLIC PANEL
Anion gap: 7 (ref 5–15)
BUN: 45 mg/dL — ABNORMAL HIGH (ref 6–20)
CO2: 30 mmol/L (ref 22–32)
Calcium: 8.7 mg/dL — ABNORMAL LOW (ref 8.9–10.3)
Chloride: 102 mmol/L (ref 98–111)
Creatinine, Ser: 0.8 mg/dL (ref 0.44–1.00)
GFR, Estimated: >60 mL/min (ref >60)
Glucose, Bld: 118 mg/dL — ABNORMAL HIGH (ref 70–99)
Potassium: 3.1 mmol/L — ABNORMAL LOW (ref 3.5–5.1)
Sodium: 139 mmol/L (ref 135–145)

## 2020-06-10 LAB — ABO/RH: ABO/RH(D): O POS

## 2020-06-10 LAB — OCCULT BLOOD X 1 CARD TO LAB, STOOL: Fecal Occult Bld: POSITIVE — AB

## 2020-06-10 LAB — PREPARE RBC (CROSSMATCH)

## 2020-06-10 LAB — MAGNESIUM: Magnesium: 1.7 mg/dL (ref 1.7–2.4)

## 2020-06-10 NOTE — Progress Notes (Signed)
Pulmonary Critical Care Medicine [facility] [place]   PULMONARY CRITICAL CARE SERVICE  PROGRESS NOTE  Date of Service: 06/10/2020  Teresa [name]  [id number]  DOB: 1961/04/06   DOA: 06/01/2020  Referring Physician: Carron Curie, MD  HPI: Teresa [name] is a 59 y.o. female seen for follow up of Acute on Chronic Respiratory Failure.  Patient currently is on T collar has been on 28% FiO2 today's goal 12 hours  Medications: Reviewed on Rounds  Physical Exam:  Vitals: Temperature is 98.1 pulse 82 respiratory rate is 21 blood pressure 100/71 saturations 99%  Ventilator Settings on T collar FiO2 28%  . General: Comfortable at this time . Eyes: Grossly normal lids, irises & conjunctiva . ENT: grossly tongue is normal . Neck: no obvious mass . Cardiovascular: S1 S2 normal no gallop . Respiratory: Very coarse breath sounds . Abdomen: soft . Skin: no rash seen on limited exam . Musculoskeletal: not rigid . Psychiatric:unable to assess . Neurologic: no seizure no involuntary movements         Lab Data:   Basic Metabolic Panel: Recent Labs  Lab 06/04/20 0440 06/05/20 1238 06/06/20 0708 06/10/20 0420  NA 143 141  141 142 139  K 3.2* 4.2  4.3 3.7 3.1*  CL 100 98  100 101 102  CO2 32 31  32 33* 30  GLUCOSE 139* 131*  132* 129* 118*  BUN 59* 66*  68* 55* 45*  CREATININE 1.12* 0.99  1.04* 0.86 0.80  CALCIUM 8.9 8.8*  8.7* 9.0 8.7*  MG 1.8 1.8  --  1.7  PHOS 2.6 3.3  --   --     ABG: Recent Labs  Lab 06/04/20 0909 06/05/20 0520  PHART 7.530* 7.478*  PCO2ART 43.6 49.2*  PO2ART 80.1* 92.5  HCO3 36.3* 36.2*  O2SAT 97.2 97.8    Liver Function Tests: Recent Labs  Lab 06/04/20 0440 06/05/20 1238  ALBUMIN 2.4* 2.3*   No results for input(s): LIPASE, AMYLASE in the last 168 hours. No results for input(s): AMMONIA in the last 168 hours.  CBC: Recent Labs  Lab 06/04/20 0440 06/05/20 1238 06/06/20 0708 06/10/20 0420  WBC 14.8* 12.6* 11.6*  10.5  HGB 7.2* 7.4* 7.2* 6.9*  HCT 23.6* 24.6* 24.2* 22.5*  MCV 94.0 95.7 95.3 94.1  PLT 255 286 279 266    Cardiac Enzymes: No results for input(s): CKTOTAL, CKMB, CKMBINDEX, TROPONINI in the last 168 hours.  BNP (last 3 results) No results for input(s): BNP in the last 8760 hours.  ProBNP (last 3 results) No results for input(s): PROBNP in the last 8760 hours.  Radiological Exams: No results found.  Assessment/Plan Active Problems:   Acute on chronic respiratory failure with hypoxia (HCC)   Atrial flutter (HCC)   Cardiogenic shock (HCC)   Coronary artery disease involving native coronary artery of native heart   Acute renal failure due to tubular necrosis (HCC)   1. Acute on chronic respiratory failure hypoxia we will continue to wean on T collar goal of 12 hours today. 2. Atrial flutter rate is controlled at this time we will continue to monitor. 3. Cardiogenic shock resolved 4. Coronary artery disease supportive care cardiology following 5. Acute renal failure monitoring labs   I have personally seen and evaluated the patient, evaluated laboratory and imaging results, formulated the assessment and plan and placed orders. The Patient requires high complexity decision making with multiple systems involvement.  Rounds were done with the Respiratory Therapy Director and Staff  therapists and discussed with nursing staff also.  Allyne Gee, MD [facility] Pulmonary Critical Care Medicine Sleep Medicine

## 2020-06-11 DIAGNOSIS — I2510 Atherosclerotic heart disease of native coronary artery without angina pectoris: Secondary | ICD-10-CM | POA: Diagnosis not present

## 2020-06-11 DIAGNOSIS — J9621 Acute and chronic respiratory failure with hypoxia: Secondary | ICD-10-CM | POA: Diagnosis not present

## 2020-06-11 DIAGNOSIS — N170 Acute kidney failure with tubular necrosis: Secondary | ICD-10-CM | POA: Diagnosis not present

## 2020-06-11 DIAGNOSIS — I483 Typical atrial flutter: Secondary | ICD-10-CM | POA: Diagnosis not present

## 2020-06-11 LAB — CBC
HCT: 27.9 % — ABNORMAL LOW (ref 36.0–46.0)
Hemoglobin: 8.7 g/dL — ABNORMAL LOW (ref 12.0–15.0)
MCH: 28.8 pg (ref 26.0–34.0)
MCHC: 31.2 g/dL (ref 30.0–36.0)
MCV: 92.4 fL (ref 80.0–100.0)
Platelets: 278 10*3/uL (ref 150–400)
RBC: 3.02 MIL/uL — ABNORMAL LOW (ref 3.87–5.11)
RDW: 16.1 % — ABNORMAL HIGH (ref 11.5–15.5)
WBC: 13 10*3/uL — ABNORMAL HIGH (ref 4.0–10.5)
nRBC: 0 % (ref 0.0–0.2)

## 2020-06-11 LAB — TYPE AND SCREEN
ABO/RH(D): O POS
Antibody Screen: NEGATIVE
Unit division: 0

## 2020-06-11 LAB — BPAM RBC
Blood Product Expiration Date: 202112062359
ISSUE DATE / TIME: 202111041448
Unit Type and Rh: 5100

## 2020-06-11 LAB — POTASSIUM: Potassium: 3.5 mmol/L (ref 3.5–5.1)

## 2020-06-11 LAB — MAGNESIUM: Magnesium: 2.3 mg/dL (ref 1.7–2.4)

## 2020-06-11 NOTE — Progress Notes (Signed)
Pulmonary Critical Care Medicine [facility] [place]   PULMONARY CRITICAL CARE SERVICE  PROGRESS NOTE  Date of Service: 06/11/2020  Teresa [name]  [id number]  DOB: 1961-06-11   DOA: 06/01/2020  Referring Physician: Carron Curie, MD  HPI: Teresa [name] is a 59 y.o. female seen for follow up of Acute on Chronic Respiratory Failure. Patient is on T collar currently the goal is for 16 hours on the wean  Medications: Reviewed on Rounds  Physical Exam:  Vitals: Temperature is 98.6 pulse 86 respiratory rate 20 blood pressure is 108/54 saturations 99%  Ventilator Settings on T collar with an FiO2 of 28%  . General: Comfortable at this time . Eyes: Grossly normal lids, irises & conjunctiva . ENT: grossly tongue is normal . Neck: no obvious mass . Cardiovascular: S1 S2 normal no gallop . Respiratory: No rhonchi very coarse breath sounds . Abdomen: soft . Skin: no rash seen on limited exam . Musculoskeletal: not rigid . Psychiatric:unable to assess . Neurologic: no seizure no involuntary movements         Lab Data:   Basic Metabolic Panel: Recent Labs  Lab 06/05/20 1238 06/06/20 0708 06/10/20 0420 06/11/20 0246  NA 141  141 142 139  --   K 4.2  4.3 3.7 3.1* 3.5  CL 98  100 101 102  --   CO2 31  32 33* 30  --   GLUCOSE 131*  132* 129* 118*  --   BUN 66*  68* 55* 45*  --   CREATININE 0.99  1.04* 0.86 0.80  --   CALCIUM 8.8*  8.7* 9.0 8.7*  --   MG 1.8  --  1.7 2.3  PHOS 3.3  --   --   --     ABG: Recent Labs  Lab 06/05/20 0520  PHART 7.478*  PCO2ART 49.2*  PO2ART 92.5  HCO3 36.2*  O2SAT 97.8    Liver Function Tests: Recent Labs  Lab 06/05/20 1238  ALBUMIN 2.3*   No results for input(s): LIPASE, AMYLASE in the last 168 hours. No results for input(s): AMMONIA in the last 168 hours.  CBC: Recent Labs  Lab 06/05/20 1238 06/06/20 0708 06/10/20 0420  WBC 12.6* 11.6* 10.5  HGB 7.4* 7.2* 6.9*  HCT 24.6* 24.2* 22.5*  MCV 95.7 95.3  94.1  PLT 286 279 266    Cardiac Enzymes: No results for input(s): CKTOTAL, CKMB, CKMBINDEX, TROPONINI in the last 168 hours.  BNP (last 3 results) No results for input(s): BNP in the last 8760 hours.  ProBNP (last 3 results) No results for input(s): PROBNP in the last 8760 hours.  Radiological Exams: No results found.  Assessment/Plan Active Problems:   Acute on chronic respiratory failure with hypoxia (HCC)   Atrial flutter (HCC)   Cardiogenic shock (HCC)   Coronary artery disease involving native coronary artery of native heart   Acute renal failure due to tubular necrosis (HCC)   1. Acute on chronic respiratory failure hypoxia we will continue with the weaning process T collar for 16 hours 2. Atrial flutter rate is controlled continue present management 3. Cardiogenic shock supportive care 4. Coronary artery disease at baseline 5. Acute renal failure with tubular necrosis continue present management   I have personally seen and evaluated the patient, evaluated laboratory and imaging results, formulated the assessment and plan and placed orders. The Patient requires high complexity decision making with multiple systems involvement.  Rounds were done with the Respiratory Therapy Director and Staff  therapists and discussed with nursing staff also.  Allyne Gee, MD [facility] Pulmonary Critical Care Medicine Sleep Medicine

## 2020-06-12 ENCOUNTER — Other Ambulatory Visit (HOSPITAL_COMMUNITY): Payer: 59

## 2020-06-12 LAB — URINALYSIS, ROUTINE W REFLEX MICROSCOPIC
Bacteria, UA: NONE SEEN
Bilirubin Urine: NEGATIVE
Glucose, UA: NEGATIVE mg/dL
Hgb urine dipstick: NEGATIVE
Ketones, ur: NEGATIVE mg/dL
Leukocytes,Ua: NEGATIVE
Nitrite: NEGATIVE
Protein, ur: 100 mg/dL — AB
Specific Gravity, Urine: 1.019 (ref 1.005–1.030)
pH: 6 (ref 5.0–8.0)

## 2020-06-13 ENCOUNTER — Other Ambulatory Visit (HOSPITAL_COMMUNITY): Payer: 59

## 2020-06-13 DIAGNOSIS — I2510 Atherosclerotic heart disease of native coronary artery without angina pectoris: Secondary | ICD-10-CM | POA: Diagnosis not present

## 2020-06-13 DIAGNOSIS — N170 Acute kidney failure with tubular necrosis: Secondary | ICD-10-CM | POA: Diagnosis not present

## 2020-06-13 DIAGNOSIS — I483 Typical atrial flutter: Secondary | ICD-10-CM | POA: Diagnosis not present

## 2020-06-13 DIAGNOSIS — J9621 Acute and chronic respiratory failure with hypoxia: Secondary | ICD-10-CM | POA: Diagnosis not present

## 2020-06-13 LAB — RENAL FUNCTION PANEL
Albumin: 2.3 g/dL — ABNORMAL LOW (ref 3.5–5.0)
Anion gap: 9 (ref 5–15)
BUN: 36 mg/dL — ABNORMAL HIGH (ref 6–20)
CO2: 28 mmol/L (ref 22–32)
Calcium: 8.9 mg/dL (ref 8.9–10.3)
Chloride: 104 mmol/L (ref 98–111)
Creatinine, Ser: 0.65 mg/dL (ref 0.44–1.00)
GFR, Estimated: >60 mL/min (ref >60)
Glucose, Bld: 124 mg/dL — ABNORMAL HIGH (ref 70–99)
Phosphorus: 3.7 mg/dL (ref 2.5–4.6)
Potassium: 3.8 mmol/L (ref 3.5–5.1)
Sodium: 141 mmol/L (ref 135–145)

## 2020-06-13 LAB — CBC
HCT: 28.2 % — ABNORMAL LOW (ref 36.0–46.0)
Hemoglobin: 8.7 g/dL — ABNORMAL LOW (ref 12.0–15.0)
MCH: 28.7 pg (ref 26.0–34.0)
MCHC: 30.9 g/dL (ref 30.0–36.0)
MCV: 93.1 fL (ref 80.0–100.0)
Platelets: 291 10*3/uL (ref 150–400)
RBC: 3.03 MIL/uL — ABNORMAL LOW (ref 3.87–5.11)
RDW: 15.9 % — ABNORMAL HIGH (ref 11.5–15.5)
WBC: 12.6 10*3/uL — ABNORMAL HIGH (ref 4.0–10.5)
nRBC: 0 % (ref 0.0–0.2)

## 2020-06-13 LAB — MAGNESIUM: Magnesium: 1.8 mg/dL (ref 1.7–2.4)

## 2020-06-13 LAB — URINE CULTURE: Culture: <10000 — AB

## 2020-06-13 IMAGING — DX DG CHEST 1V PORT
1 series · 1 of 1 positions shown · non-contrast
Comparison: [DATE]

CLINICAL DATA: Evaluate for pneumonia.  Leukocytosis.

EXAM:
PORTABLE CHEST 1 VIEW

[chest ap]
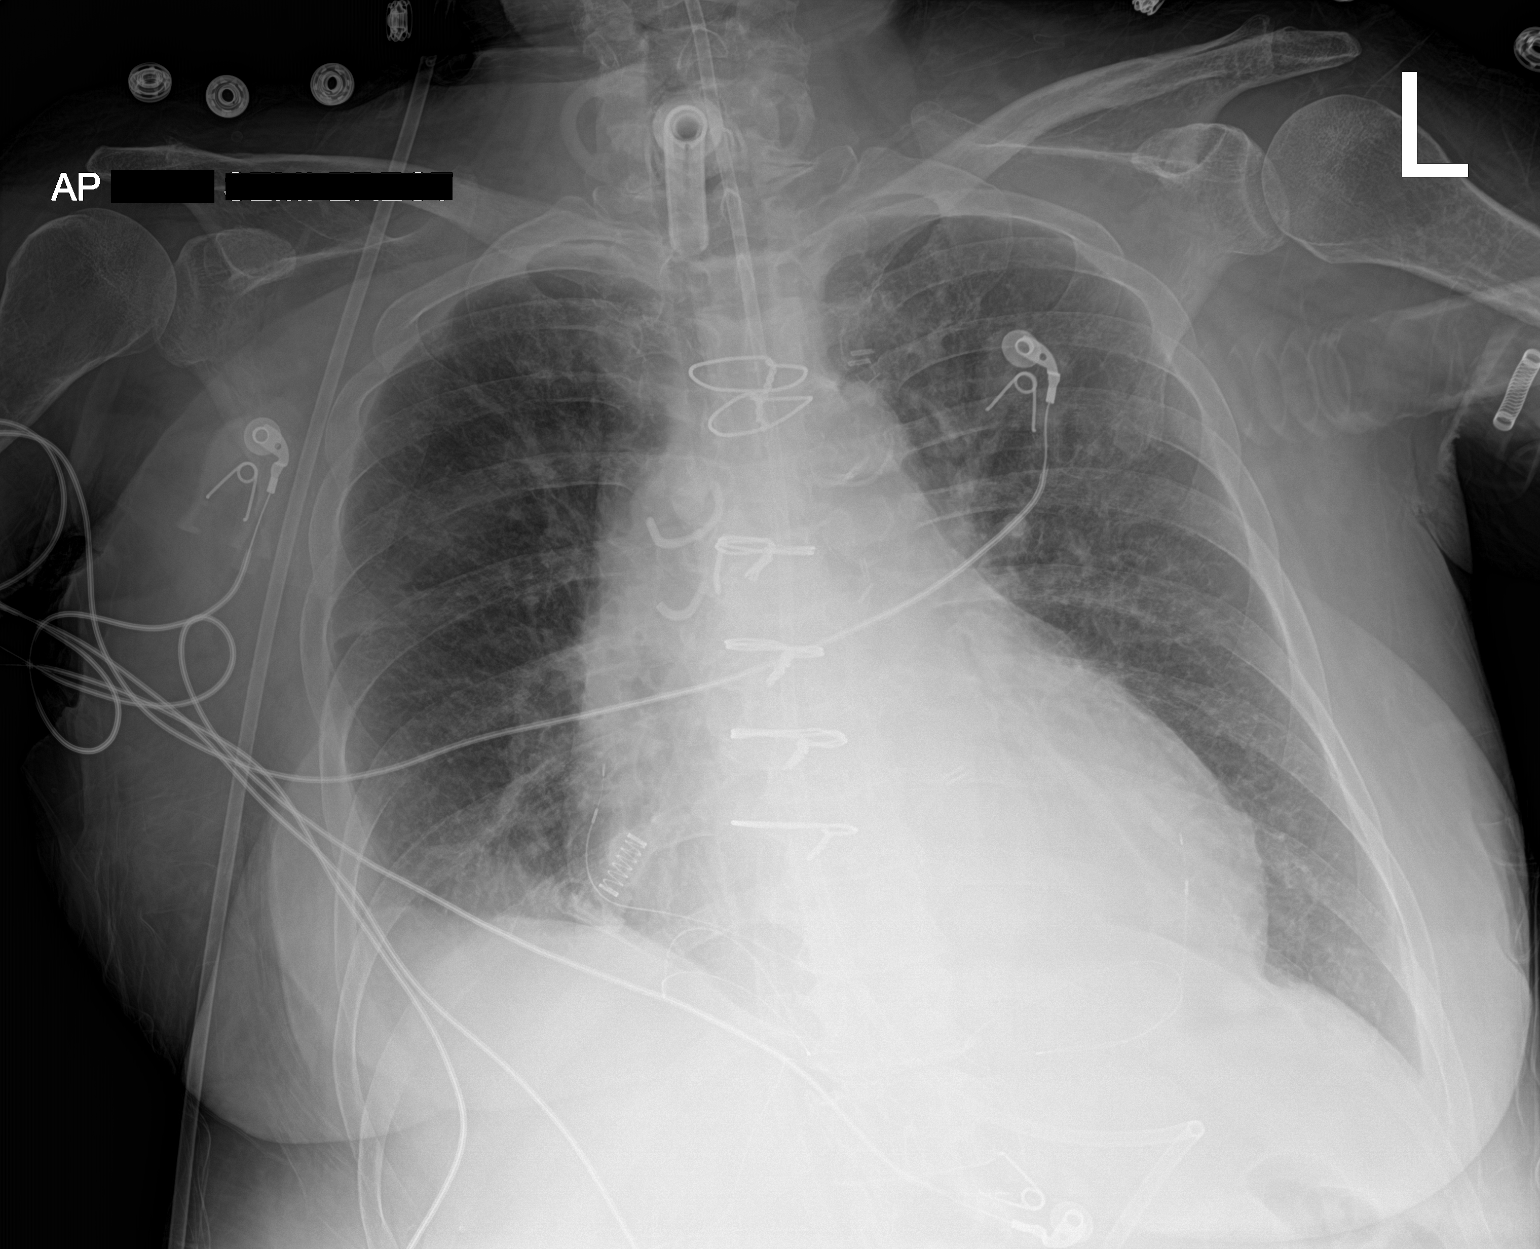

[1 of 1 positions shown; findings below may reference images not displayed]

FINDINGS: The tracheostomy tube tip is above the carina. There is a feeding
tube with tip below the GE junction. Previous median sternotomy and
CABG procedure. Heart size is enlarged. Pulmonary vascular
congestion. Small right pleural effusion appears new from previous
exam. No airspace consolidation.
IMPRESSION: 1. New small right pleural effusion.
2. Pulmonary vascular congestion.

## 2020-06-13 NOTE — Progress Notes (Signed)
Pulmonary Critical Care Medicine [facility] [place]   PULMONARY CRITICAL CARE SERVICE  PROGRESS NOTE  Date of Service: 06/13/2020  Teresa [name]  EUM:[id number]  DOB: [birth year]   DOA: 06/01/2020  Referring Physician: Carron Curie, MD  HPI: Teresa [name] is a 59 y.o. female seen for follow up of Acute on Chronic Respiratory Failure.  Patient is on T collar currently on 28% FiO2 with good saturations.  Medications: Reviewed on Rounds  Physical Exam:  Vitals: Temperature is 97.7 pulse 93 respiratory 19 blood pressure 128/70 saturations 99%  Ventilator Settings on T collar FiO2 28%  . General: Comfortable at this time . Eyes: Grossly normal lids, irises & conjunctiva . ENT: grossly tongue is normal . Neck: no obvious mass . Cardiovascular: S1 S2 normal no gallop . Respiratory: No rhonchi very coarse breath sounds noted . Abdomen: soft . Skin: no rash seen on limited exam . Musculoskeletal: not rigid . Psychiatric:unable to assess . Neurologic: no seizure no involuntary movements         Lab Data:   Basic Metabolic Panel: Recent Labs  Lab 06/10/20 0420 06/11/20 0246  NA 139  --   K 3.1* 3.5  CL 102  --   CO2 30  --   GLUCOSE 118*  --   BUN 45*  --   CREATININE 0.80  --   CALCIUM 8.7*  --   MG 1.7 2.3    ABG: No results for input(s): PHART, PCO2ART, PO2ART, HCO3, O2SAT in the last 168 hours.  Liver Function Tests: No results for input(s): AST, ALT, ALKPHOS, BILITOT, PROT, ALBUMIN in the last 168 hours. No results for input(s): LIPASE, AMYLASE in the last 168 hours. No results for input(s): AMMONIA in the last 168 hours.  CBC: Recent Labs  Lab 06/10/20 0420 06/11/20 1330  WBC 10.5 13.0*  HGB 6.9* 8.7*  HCT 22.5* 27.9*  MCV 94.1 92.4  PLT 266 278    Cardiac Enzymes: No results for input(s): CKTOTAL, CKMB, CKMBINDEX, TROPONINI in the last 168 hours.  BNP (last 3 results) No results for input(s): BNP in the last 8760 hours.  ProBNP  (last 3 results) No results for input(s): PROBNP in the last 8760 hours.  Radiological Exams: DG CHEST PORT 1 VIEW  Result Date: 06/13/2020 CLINICAL DATA:  Evaluate for pneumonia.  Leukocytosis. EXAM: PORTABLE CHEST 1 VIEW COMPARISON:  06/04/2020 FINDINGS: The tracheostomy tube tip is above the carina. There is a feeding tube with tip below the GE junction. Previous median sternotomy and CABG procedure. Heart size is enlarged. Pulmonary vascular congestion. Small right pleural effusion appears new from previous exam. No airspace consolidation. IMPRESSION: 1. New small right pleural effusion. 2. Pulmonary vascular congestion. Electronically Signed   By: Signa Kell M.D.   On: 06/13/2020 07:15    Assessment/Plan Active Problems:   Acute on chronic respiratory failure with hypoxia (HCC)   Atrial flutter (HCC)   Cardiogenic shock (HCC)   Coronary artery disease involving native coronary artery of native heart   Acute renal failure due to tubular necrosis (HCC)   1. Acute on chronic respiratory failure hypoxia we will continue T collar wean as tolerated titrate as tolerated 2. Atrial flutter rate control continue present management 3. Cardiogenic shock resolved 4. Coronary artery disease supportive care 5. Acute renal failure tubular necrosis labs remain stable   I have personally seen and evaluated the patient, evaluated laboratory and imaging results, formulated the assessment and plan and placed orders. The Patient  requires high complexity decision making with multiple systems involvement.  Rounds were done with the Respiratory Therapy Director and Staff therapists and discussed with nursing staff also.  Allyne Gee, MD [facility] Pulmonary Critical Care Medicine Sleep Medicine

## 2020-06-14 ENCOUNTER — Other Ambulatory Visit (HOSPITAL_COMMUNITY): Payer: 59

## 2020-06-14 DIAGNOSIS — N170 Acute kidney failure with tubular necrosis: Secondary | ICD-10-CM | POA: Diagnosis not present

## 2020-06-14 DIAGNOSIS — J9621 Acute and chronic respiratory failure with hypoxia: Secondary | ICD-10-CM | POA: Diagnosis not present

## 2020-06-14 DIAGNOSIS — I483 Typical atrial flutter: Secondary | ICD-10-CM | POA: Diagnosis not present

## 2020-06-14 DIAGNOSIS — I2510 Atherosclerotic heart disease of native coronary artery without angina pectoris: Secondary | ICD-10-CM | POA: Diagnosis not present

## 2020-06-14 LAB — BLOOD GAS, ARTERIAL
Acid-Base Excess: 7.1 mmol/L — ABNORMAL HIGH (ref 0.0–2.0)
Bicarbonate: 30.1 mmol/L — ABNORMAL HIGH (ref 20.0–28.0)
FIO2: 28
O2 Saturation: 99.4 %
Patient temperature: 37
pCO2 arterial: 34.9 mmHg (ref 32.0–48.0)
pH, Arterial: 7.545 — ABNORMAL HIGH (ref 7.350–7.450)
pO2, Arterial: 145 mmHg — ABNORMAL HIGH (ref 83.0–108.0)

## 2020-06-14 LAB — RENAL FUNCTION PANEL
Albumin: 2.3 g/dL — ABNORMAL LOW (ref 3.5–5.0)
Anion gap: 7 (ref 5–15)
BUN: 35 mg/dL — ABNORMAL HIGH (ref 6–20)
CO2: 31 mmol/L (ref 22–32)
Calcium: 8.7 mg/dL — ABNORMAL LOW (ref 8.9–10.3)
Chloride: 103 mmol/L (ref 98–111)
Creatinine, Ser: 0.59 mg/dL (ref 0.44–1.00)
GFR, Estimated: >60 mL/min (ref >60)
Glucose, Bld: 114 mg/dL — ABNORMAL HIGH (ref 70–99)
Phosphorus: 3.8 mg/dL (ref 2.5–4.6)
Potassium: 3.3 mmol/L — ABNORMAL LOW (ref 3.5–5.1)
Sodium: 141 mmol/L (ref 135–145)

## 2020-06-14 LAB — CBC
HCT: 28.3 % — ABNORMAL LOW (ref 36.0–46.0)
Hemoglobin: 8.8 g/dL — ABNORMAL LOW (ref 12.0–15.0)
MCH: 28.9 pg (ref 26.0–34.0)
MCHC: 31.1 g/dL (ref 30.0–36.0)
MCV: 92.8 fL (ref 80.0–100.0)
Platelets: 290 10*3/uL (ref 150–400)
RBC: 3.05 MIL/uL — ABNORMAL LOW (ref 3.87–5.11)
RDW: 15.8 % — ABNORMAL HIGH (ref 11.5–15.5)
WBC: 12.4 10*3/uL — ABNORMAL HIGH (ref 4.0–10.5)
nRBC: 0 % (ref 0.0–0.2)

## 2020-06-14 LAB — MAGNESIUM: Magnesium: 1.8 mg/dL (ref 1.7–2.4)

## 2020-06-14 IMAGING — DX DG CHEST 1V PORT
1 series · 1 of 1 positions shown · non-contrast
Comparison: Yesterday

CLINICAL DATA: Pneumonia

EXAM:
PORTABLE CHEST 1 VIEW

[chest ap]
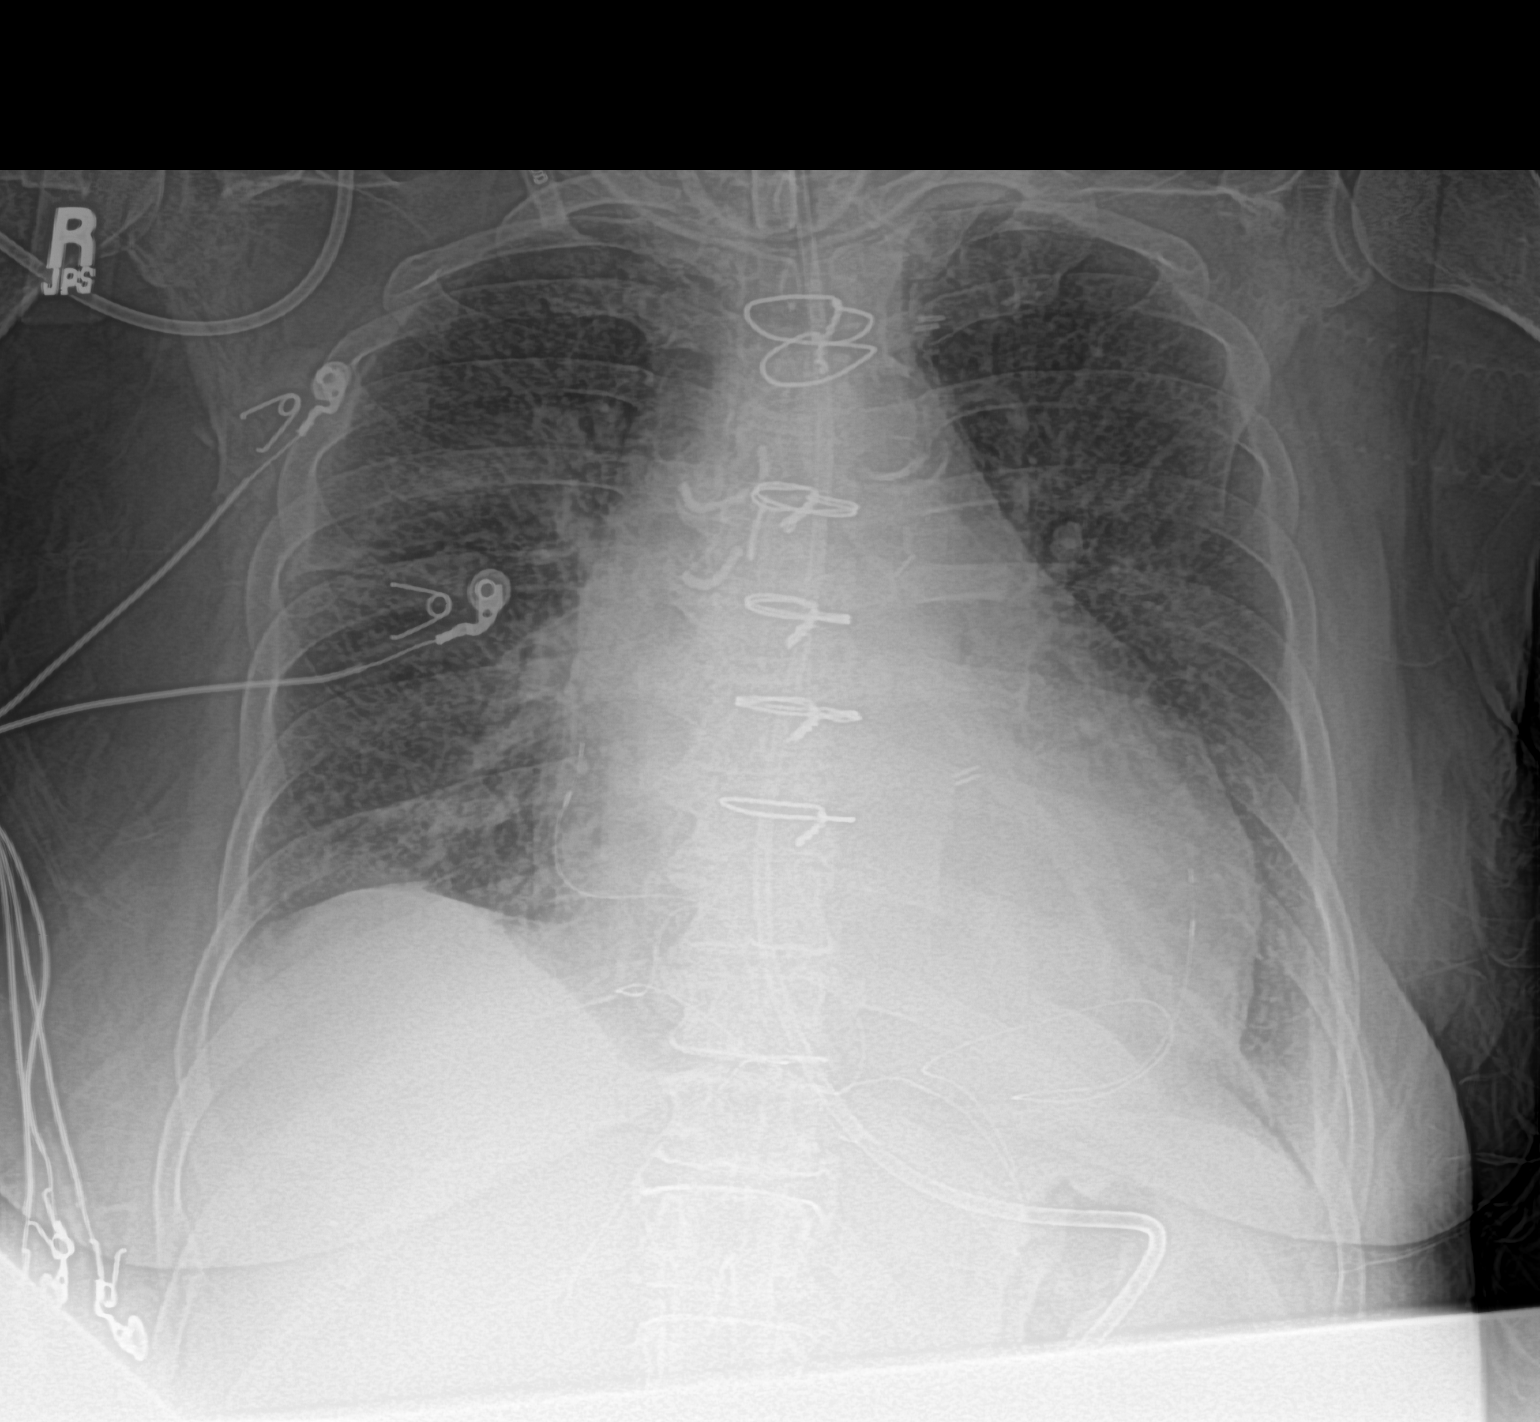

[1 of 1 positions shown; findings below may reference images not displayed]

FINDINGS: Cardiomegaly. Feeding tube which at least reaches the stomach.
Status post CABG. Diffuse interstitial opacity with small pleural
effusion on the left at least. The interstitial opacity is
increased. No pneumothorax.
IMPRESSION: Increasing interstitial markings compatible with edema.

## 2020-06-14 NOTE — Progress Notes (Signed)
Pulmonary Critical Care Medicine [facility] [place]   PULMONARY CRITICAL CARE SERVICE  PROGRESS NOTE  Date of Service: 06/14/2020  Teresa [name]  QMV:[id number]  DOB: 26-Dec-1960   DOA: 06/01/2020  Referring Physician: Carron Curie, MD  HPI: Teresa [name] is a 59 y.o. female seen for follow up of Acute on Chronic Respiratory Failure.  Patient is on T collar has been on 28% FiO2 good saturations are noted.  Medications: Reviewed on Rounds  Physical Exam:  Vitals: Temperature is 97.4 pulse 101 respiratory rate 39 blood pressure is 144/71 saturations 93%  Ventilator Settings on T collar FiO2 28%  . General: Comfortable at this time . Eyes: Grossly normal lids, irises & conjunctiva . ENT: grossly tongue is normal . Neck: no obvious mass . Cardiovascular: S1 S2 normal no gallop . Respiratory: No rhonchi no rales noted at this time . Abdomen: soft . Skin: no rash seen on limited exam . Musculoskeletal: not rigid . Psychiatric:unable to assess . Neurologic: no seizure no involuntary movements         Lab Data:   Basic Metabolic Panel: Recent Labs  Lab 06/10/20 0420 06/11/20 0246 06/13/20 1031 06/14/20 0621  NA 139  --  141 141  K 3.1* 3.5 3.8 3.3*  CL 102  --  104 103  CO2 30  --  28 31  GLUCOSE 118*  --  124* 114*  BUN 45*  --  36* 35*  CREATININE 0.80  --  0.65 0.59  CALCIUM 8.7*  --  8.9 8.7*  MG 1.7 2.3 1.8 1.8  PHOS  --   --  3.7 3.8    ABG: No results for input(s): PHART, PCO2ART, PO2ART, HCO3, O2SAT in the last 168 hours.  Liver Function Tests: Recent Labs  Lab 06/13/20 1031 06/14/20 0621  ALBUMIN 2.3* 2.3*   No results for input(s): LIPASE, AMYLASE in the last 168 hours. No results for input(s): AMMONIA in the last 168 hours.  CBC: Recent Labs  Lab 06/10/20 0420 06/11/20 1330 06/13/20 1031 06/14/20 0621  WBC 10.5 13.0* 12.6* 12.4*  HGB 6.9* 8.7* 8.7* 8.8*  HCT 22.5* 27.9* 28.2* 28.3*  MCV 94.1 92.4 93.1 92.8  PLT 266 278 291  290    Cardiac Enzymes: No results for input(s): CKTOTAL, CKMB, CKMBINDEX, TROPONINI in the last 168 hours.  BNP (last 3 results) No results for input(s): BNP in the last 8760 hours.  ProBNP (last 3 results) No results for input(s): PROBNP in the last 8760 hours.  Radiological Exams: DG CHEST PORT 1 VIEW  Result Date: 06/14/2020 CLINICAL DATA:  Pneumonia EXAM: PORTABLE CHEST 1 VIEW COMPARISON:  Yesterday FINDINGS: Cardiomegaly. Feeding tube which at least reaches the stomach. Status post CABG. Diffuse interstitial opacity with small pleural effusion on the left at least. The interstitial opacity is increased. No pneumothorax. IMPRESSION: Increasing interstitial markings compatible with edema. Electronically Signed   By: Marnee Spring M.D.   On: 06/14/2020 05:52   DG CHEST PORT 1 VIEW  Result Date: 06/13/2020 CLINICAL DATA:  Evaluate for pneumonia.  Leukocytosis. EXAM: PORTABLE CHEST 1 VIEW COMPARISON:  06/04/2020 FINDINGS: The tracheostomy tube tip is above the carina. There is a feeding tube with tip below the GE junction. Previous median sternotomy and CABG procedure. Heart size is enlarged. Pulmonary vascular congestion. Small right pleural effusion appears new from previous exam. No airspace consolidation. IMPRESSION: 1. New small right pleural effusion. 2. Pulmonary vascular congestion. Electronically Signed   By: Signa Kell  M.D.   On: 06/13/2020 07:15    Assessment/Plan Active Problems:   Acute on chronic respiratory failure with hypoxia (HCC)   Atrial flutter (HCC)   Cardiogenic shock (HCC)   Coronary artery disease involving native coronary artery of native heart   Acute renal failure due to tubular necrosis (HCC)   1. Acute on chronic respiratory failure hypoxia we will continue with T-piece titrate oxygen continue pulmonary toilet 2. Atrial flutter no change rate is controlled 3. Cardiogenic shock hemodynamics are stable continue to monitor 4. Coronary artery disease  stable supportive care 5. ATN resolved monitoring labs   I have personally seen and evaluated the patient, evaluated laboratory and imaging results, formulated the assessment and plan and placed orders. The Patient requires high complexity decision making with multiple systems involvement.  Rounds were done with the Respiratory Therapy Director and Staff therapists and discussed with nursing staff also.  Yevonne Pax, MD [facility] Pulmonary Critical Care Medicine Sleep Medicine

## 2020-06-15 LAB — POTASSIUM: Potassium: 3.7 mmol/L (ref 3.5–5.1)

## 2020-06-16 DIAGNOSIS — J9621 Acute and chronic respiratory failure with hypoxia: Secondary | ICD-10-CM | POA: Diagnosis not present

## 2020-06-16 DIAGNOSIS — I2510 Atherosclerotic heart disease of native coronary artery without angina pectoris: Secondary | ICD-10-CM | POA: Diagnosis not present

## 2020-06-16 DIAGNOSIS — N170 Acute kidney failure with tubular necrosis: Secondary | ICD-10-CM | POA: Diagnosis not present

## 2020-06-16 DIAGNOSIS — I483 Typical atrial flutter: Secondary | ICD-10-CM | POA: Diagnosis not present

## 2020-06-16 NOTE — Progress Notes (Signed)
Pulmonary Critical Care Medicine [facility] [place]   PULMONARY CRITICAL CARE SERVICE  PROGRESS NOTE  Date of Service: 06/16/2020  Teresa [name]  [id number]  DOB: Feb 15, 1961   DOA: 06/01/2020  Referring Physician: Carron Curie, MD  HPI: Teresa [name] is a 59 y.o. female seen for follow up of Acute on Chronic Respiratory Failure.  Patient currently is on T collar has been on 28% FiO2 with good saturations.  Medications: Reviewed on Rounds  Physical Exam:  Vitals: Temperature is 96.6 pulse 95 respiratory 24 blood pressure is 115/75 saturations 97%  Ventilator Settings on T collar with FiO2 28%  . General: Comfortable at this time . Eyes: Grossly normal lids, irises & conjunctiva . ENT: grossly tongue is normal . Neck: no obvious mass . Cardiovascular: S1 S2 normal no gallop . Respiratory: No rhonchi very coarse breath sounds . Abdomen: soft . Skin: no rash seen on limited exam . Musculoskeletal: not rigid . Psychiatric:unable to assess . Neurologic: no seizure no involuntary movements         Lab Data:   Basic Metabolic Panel: Recent Labs  Lab 06/10/20 0420 06/11/20 0246 06/13/20 1031 06/14/20 0621 06/15/20 0746  NA 139  --  141 141  --   K 3.1* 3.5 3.8 3.3* 3.7  CL 102  --  104 103  --   CO2 30  --  28 31  --   GLUCOSE 118*  --  124* 114*  --   BUN 45*  --  36* 35*  --   CREATININE 0.80  --  0.65 0.59  --   CALCIUM 8.7*  --  8.9 8.7*  --   MG 1.7 2.3 1.8 1.8  --   PHOS  --   --  3.7 3.8  --     ABG: Recent Labs  Lab 06/14/20 0900  PHART 7.545*  PCO2ART 34.9  PO2ART 145*  HCO3 30.1*  O2SAT 99.4    Liver Function Tests: Recent Labs  Lab 06/13/20 1031 06/14/20 0621  ALBUMIN 2.3* 2.3*   No results for input(s): LIPASE, AMYLASE in the last 168 hours. No results for input(s): AMMONIA in the last 168 hours.  CBC: Recent Labs  Lab 06/10/20 0420 06/11/20 1330 06/13/20 1031 06/14/20 0621  WBC 10.5 13.0* 12.6* 12.4*  HGB 6.9*  8.7* 8.7* 8.8*  HCT 22.5* 27.9* 28.2* 28.3*  MCV 94.1 92.4 93.1 92.8  PLT 266 278 291 290    Cardiac Enzymes: No results for input(s): CKTOTAL, CKMB, CKMBINDEX, TROPONINI in the last 168 hours.  BNP (last 3 results) No results for input(s): BNP in the last 8760 hours.  ProBNP (last 3 results) No results for input(s): PROBNP in the last 8760 hours.  Radiological Exams: No results found.  Assessment/Plan Active Problems:   Acute on chronic respiratory failure with hypoxia (HCC)   Atrial flutter (HCC)   Cardiogenic shock (HCC)   Coronary artery disease involving native coronary artery of native heart   Acute renal failure due to tubular necrosis (HCC)   1. Acute on chronic respiratory failure with hypoxia we will continue T collar trials patient right now is on 28% FiO2 continue pulmonary toilet supportive care. 2. Atrial flutter rate is controlled we will continue with present management 3. Cardiogenic shock resolved 4. Coronary artery disease at baseline 5. Acute renal failure following labs   I have personally seen and evaluated the patient, evaluated laboratory and imaging results, formulated the assessment and plan and placed orders.  The Patient requires high complexity decision making with multiple systems involvement.  Rounds were done with the Respiratory Therapy Director and Staff therapists and discussed with nursing staff also.  Allyne Gee, MD [facility] Pulmonary Critical Care Medicine Sleep Medicine

## 2020-06-17 ENCOUNTER — Other Ambulatory Visit (HOSPITAL_COMMUNITY): Payer: 59

## 2020-06-17 DIAGNOSIS — I483 Typical atrial flutter: Secondary | ICD-10-CM | POA: Diagnosis not present

## 2020-06-17 DIAGNOSIS — N170 Acute kidney failure with tubular necrosis: Secondary | ICD-10-CM | POA: Diagnosis not present

## 2020-06-17 DIAGNOSIS — I2510 Atherosclerotic heart disease of native coronary artery without angina pectoris: Secondary | ICD-10-CM | POA: Diagnosis not present

## 2020-06-17 DIAGNOSIS — J9621 Acute and chronic respiratory failure with hypoxia: Secondary | ICD-10-CM | POA: Diagnosis not present

## 2020-06-17 LAB — BASIC METABOLIC PANEL
Anion gap: 7 (ref 5–15)
BUN: 24 mg/dL — ABNORMAL HIGH (ref 6–20)
CO2: 31 mmol/L (ref 22–32)
Calcium: 8.8 mg/dL — ABNORMAL LOW (ref 8.9–10.3)
Chloride: 103 mmol/L (ref 98–111)
Creatinine, Ser: 0.56 mg/dL (ref 0.44–1.00)
GFR, Estimated: >60 mL/min (ref >60)
Glucose, Bld: 128 mg/dL — ABNORMAL HIGH (ref 70–99)
Potassium: 4.3 mmol/L (ref 3.5–5.1)
Sodium: 141 mmol/L (ref 135–145)

## 2020-06-17 LAB — PHOSPHORUS: Phosphorus: 3.9 mg/dL (ref 2.5–4.6)

## 2020-06-17 LAB — CBC
HCT: 28.2 % — ABNORMAL LOW (ref 36.0–46.0)
Hemoglobin: 8.8 g/dL — ABNORMAL LOW (ref 12.0–15.0)
MCH: 28.9 pg (ref 26.0–34.0)
MCHC: 31.2 g/dL (ref 30.0–36.0)
MCV: 92.8 fL (ref 80.0–100.0)
Platelets: 307 10*3/uL (ref 150–400)
RBC: 3.04 MIL/uL — ABNORMAL LOW (ref 3.87–5.11)
RDW: 15.8 % — ABNORMAL HIGH (ref 11.5–15.5)
WBC: 11.7 10*3/uL — ABNORMAL HIGH (ref 4.0–10.5)
nRBC: 0 % (ref 0.0–0.2)

## 2020-06-17 LAB — MAGNESIUM: Magnesium: 1.7 mg/dL (ref 1.7–2.4)

## 2020-06-17 NOTE — Progress Notes (Signed)
Pulmonary Critical Care Medicine [facility] [place]   PULMONARY CRITICAL CARE SERVICE  PROGRESS NOTE  Date of Service: 06/17/2020  Teresa [name]  [id number]  DOB: 25-Jul-1961   DOA: 06/01/2020  Referring Physician: Carron Curie, MD  HPI: Teresa [name] is a 59 y.o. female seen for follow up of Acute on Chronic Respiratory Failure.  Patient is on T collar has been on 28% FiO2 using PMV  Medications: Reviewed on Rounds  Physical Exam:  Vitals: Temperature 96.7 pulse ninety-three respiratory twenty-six blood pressure is 95/72 saturations 98%  Ventilator Settings on T collar with an FiO2 of 28%  . General: Comfortable at this time . Eyes: Grossly normal lids, irises & conjunctiva . ENT: grossly tongue is normal . Neck: no obvious mass . Cardiovascular: S1 S2 normal no gallop . Respiratory: No rhonchi no rales are noted at this time . Abdomen: soft . Skin: no rash seen on limited exam . Musculoskeletal: not rigid . Psychiatric:unable to assess . Neurologic: no seizure no involuntary movements         Lab Data:   Basic Metabolic Panel: Recent Labs  Lab 06/11/20 0246 06/13/20 1031 06/14/20 0621 06/15/20 0746 06/17/20 0434  NA  --  141 141  --  141  K 3.5 3.8 3.3* 3.7 4.3  CL  --  104 103  --  103  CO2  --  28 31  --  31  GLUCOSE  --  124* 114*  --  128*  BUN  --  36* 35*  --  24*  CREATININE  --  0.65 0.59  --  0.56  CALCIUM  --  8.9 8.7*  --  8.8*  MG 2.3 1.8 1.8  --  1.7  PHOS  --  3.7 3.8  --  3.9    ABG: Recent Labs  Lab 06/14/20 0900  PHART 7.545*  PCO2ART 34.9  PO2ART 145*  HCO3 30.1*  O2SAT 99.4    Liver Function Tests: Recent Labs  Lab 06/13/20 1031 06/14/20 0621  ALBUMIN 2.3* 2.3*   No results for input(s): LIPASE, AMYLASE in the last 168 hours. No results for input(s): AMMONIA in the last 168 hours.  CBC: Recent Labs  Lab 06/11/20 1330 06/13/20 1031 06/14/20 0621 06/17/20 0434  WBC 13.0* 12.6* 12.4* 11.7*  HGB 8.7*  8.7* 8.8* 8.8*  HCT 27.9* 28.2* 28.3* 28.2*  MCV 92.4 93.1 92.8 92.8  PLT 278 291 290 307    Cardiac Enzymes: No results for input(s): CKTOTAL, CKMB, CKMBINDEX, TROPONINI in the last 168 hours.  BNP (last 3 results) No results for input(s): BNP in the last 8760 hours.  ProBNP (last 3 results) No results for input(s): PROBNP in the last 8760 hours.  Radiological Exams: No results found.  Assessment/Plan Active Problems:   Acute on chronic respiratory failure with hypoxia (HCC)   Atrial flutter (HCC)   Cardiogenic shock (HCC)   Coronary artery disease involving native coronary artery of native heart   Acute renal failure due to tubular necrosis (HCC)   1. Acute on chronic respiratory failure hypoxia we will continue with T collar trials patient is on PMV with 28% FiO2 2. Atrial flutter no change rate is controlled 3. Cardiogenic shock resolved 4. Coronary artery disease at baseline 5. Acute renal failure following labs   I have personally seen and evaluated the patient, evaluated laboratory and imaging results, formulated the assessment and plan and placed orders. The Patient requires high complexity decision making with multiple  systems involvement.  Rounds were done with the Respiratory Therapy Director and Staff therapists and discussed with nursing staff also.  Allyne Gee, MD [facility] Pulmonary Critical Care Medicine Sleep Medicine

## 2020-06-18 DIAGNOSIS — I483 Typical atrial flutter: Secondary | ICD-10-CM | POA: Diagnosis not present

## 2020-06-18 DIAGNOSIS — I2510 Atherosclerotic heart disease of native coronary artery without angina pectoris: Secondary | ICD-10-CM | POA: Diagnosis not present

## 2020-06-18 DIAGNOSIS — N170 Acute kidney failure with tubular necrosis: Secondary | ICD-10-CM | POA: Diagnosis not present

## 2020-06-18 DIAGNOSIS — J9621 Acute and chronic respiratory failure with hypoxia: Secondary | ICD-10-CM | POA: Diagnosis not present

## 2020-06-18 LAB — MAGNESIUM: Magnesium: 2.1 mg/dL (ref 1.7–2.4)

## 2020-06-18 NOTE — Progress Notes (Signed)
Pulmonary Critical Care Medicine [facility] [place]   PULMONARY CRITICAL CARE SERVICE  PROGRESS NOTE  Date of Service: 06/18/2020  Teresa [name]  [id number]  DOB: [birth year]   DOA: 06/01/2020  Referring Physician: Carron Curie, MD  HPI: Teresa [name] is a 59 y.o. female seen for follow up of Acute on Chronic Respiratory Failure.  Patient currently is on T collar has been on 35% FiO2 has been using PMV also passed swallow study  Medications: Reviewed on Rounds  Physical Exam:  Vitals: Temperature 96.5 pulse 89 respiratory 21 blood pressure is 132/77 saturations 98%  Ventilator Settings on T collar with an FiO2 35% and PMV  . General: Comfortable at this time . Eyes: Grossly normal lids, irises & conjunctiva . ENT: grossly tongue is normal . Neck: no obvious mass . Cardiovascular: S1 S2 normal no gallop . Respiratory: No rhonchi no rales noted at this time . Abdomen: soft . Skin: no rash seen on limited exam . Musculoskeletal: not rigid . Psychiatric:unable to assess . Neurologic: no seizure no involuntary movements         Lab Data:   Basic Metabolic Panel: Recent Labs  Lab 06/13/20 1031 06/14/20 0621 06/15/20 0746 06/17/20 0434 06/18/20 0610  NA 141 141  --  141  --   K 3.8 3.3* 3.7 4.3  --   CL 104 103  --  103  --   CO2 28 31  --  31  --   GLUCOSE 124* 114*  --  128*  --   BUN 36* 35*  --  24*  --   CREATININE 0.65 0.59  --  0.56  --   CALCIUM 8.9 8.7*  --  8.8*  --   MG 1.8 1.8  --  1.7 2.1  PHOS 3.7 3.8  --  3.9  --     ABG: Recent Labs  Lab 06/14/20 0900  PHART 7.545*  PCO2ART 34.9  PO2ART 145*  HCO3 30.1*  O2SAT 99.4    Liver Function Tests: Recent Labs  Lab 06/13/20 1031 06/14/20 0621  ALBUMIN 2.3* 2.3*   No results for input(s): LIPASE, AMYLASE in the last 168 hours. No results for input(s): AMMONIA in the last 168 hours.  CBC: Recent Labs  Lab 06/11/20 1330 06/13/20 1031 06/14/20 0621 06/17/20 0434  WBC  13.0* 12.6* 12.4* 11.7*  HGB 8.7* 8.7* 8.8* 8.8*  HCT 27.9* 28.2* 28.3* 28.2*  MCV 92.4 93.1 92.8 92.8  PLT 278 291 290 307    Cardiac Enzymes: No results for input(s): CKTOTAL, CKMB, CKMBINDEX, TROPONINI in the last 168 hours.  BNP (last 3 results) No results for input(s): BNP in the last 8760 hours.  ProBNP (last 3 results) No results for input(s): PROBNP in the last 8760 hours.  Radiological Exams: No results found.  Assessment/Plan Active Problems:   Acute on chronic respiratory failure with hypoxia (HCC)   Atrial flutter (HCC)   Cardiogenic shock (HCC)   Coronary artery disease involving native coronary artery of native heart   Acute renal failure due to tubular necrosis (HCC)   1. Acute on chronic respiratory failure hypoxia we will continue with T-piece titrate oxygen continue pulmonary toilet. 2. Atrial flutter no change we will continue to follow rate is controlled 3. Cardiogenic shock hemodynamics are stable 4. Coronary artery disease at baseline 5. Acute renal failure following up on labs   I have personally seen and evaluated the patient, evaluated laboratory and imaging results, formulated the assessment  and plan and placed orders. The Patient requires high complexity decision making with multiple systems involvement.  Rounds were done with the Respiratory Therapy Director and Staff therapists and discussed with nursing staff also.  Allyne Gee, MD [facility] Pulmonary Critical Care Medicine Sleep Medicine

## 2020-06-19 DIAGNOSIS — N170 Acute kidney failure with tubular necrosis: Secondary | ICD-10-CM | POA: Diagnosis not present

## 2020-06-19 DIAGNOSIS — I483 Typical atrial flutter: Secondary | ICD-10-CM | POA: Diagnosis not present

## 2020-06-19 DIAGNOSIS — I2510 Atherosclerotic heart disease of native coronary artery without angina pectoris: Secondary | ICD-10-CM | POA: Diagnosis not present

## 2020-06-19 DIAGNOSIS — J9621 Acute and chronic respiratory failure with hypoxia: Secondary | ICD-10-CM | POA: Diagnosis not present

## 2020-06-19 NOTE — Progress Notes (Signed)
Pulmonary Critical Care Medicine [facility] [place]   PULMONARY CRITICAL CARE SERVICE  PROGRESS NOTE  Date of Service: 06/19/2020  Teresa [name]  [id number]  DOB: 12-31-1960   DOA: 06/01/2020  Referring Physician: Carron Curie, MD  HPI: Teresa [name] is a 59 y.o. female seen for follow up of Acute on Chronic Respiratory Failure.  Patient is capping right now on 3 L of oxygen  Medications: Reviewed on Rounds  Physical Exam:  Vitals: Temperature is 98.1 pulse 85 respiratory rate 23 blood pressure is 156/88 saturations 92%  Ventilator Settings capping off the ventilator on 3 L of oxygen  . General: Comfortable at this time . Eyes: Grossly normal lids, irises & conjunctiva . ENT: grossly tongue is normal . Neck: no obvious mass . Cardiovascular: S1 S2 normal no gallop . Respiratory: No rhonchi no rales noted at this time . Abdomen: soft . Skin: no rash seen on limited exam . Musculoskeletal: not rigid . Psychiatric:unable to assess . Neurologic: no seizure no involuntary movements         Lab Data:   Basic Metabolic Panel: Recent Labs  Lab 06/13/20 1031 06/14/20 0621 06/15/20 0746 06/17/20 0434 06/18/20 0610  NA 141 141  --  141  --   K 3.8 3.3* 3.7 4.3  --   CL 104 103  --  103  --   CO2 28 31  --  31  --   GLUCOSE 124* 114*  --  128*  --   BUN 36* 35*  --  24*  --   CREATININE 0.65 0.59  --  0.56  --   CALCIUM 8.9 8.7*  --  8.8*  --   MG 1.8 1.8  --  1.7 2.1  PHOS 3.7 3.8  --  3.9  --     ABG: Recent Labs  Lab 06/14/20 0900  PHART 7.545*  PCO2ART 34.9  PO2ART 145*  HCO3 30.1*  O2SAT 99.4    Liver Function Tests: Recent Labs  Lab 06/13/20 1031 06/14/20 0621  ALBUMIN 2.3* 2.3*   No results for input(s): LIPASE, AMYLASE in the last 168 hours. No results for input(s): AMMONIA in the last 168 hours.  CBC: Recent Labs  Lab 06/13/20 1031 06/14/20 0621 06/17/20 0434  WBC 12.6* 12.4* 11.7*  HGB 8.7* 8.8* 8.8*  HCT 28.2* 28.3*  28.2*  MCV 93.1 92.8 92.8  PLT 291 290 307    Cardiac Enzymes: No results for input(s): CKTOTAL, CKMB, CKMBINDEX, TROPONINI in the last 168 hours.  BNP (last 3 results) No results for input(s): BNP in the last 8760 hours.  ProBNP (last 3 results) No results for input(s): PROBNP in the last 8760 hours.  Radiological Exams: No results found.  Assessment/Plan Active Problems:   Acute on chronic respiratory failure with hypoxia (HCC)   Atrial flutter (HCC)   Cardiogenic shock (HCC)   Coronary artery disease involving native coronary artery of native heart   Acute renal failure due to tubular necrosis (HCC)   1. Acute on chronic respiratory failure hypoxia we [name] continue with capping doing well so far. 2. Atrial flutter rate is controlled 3. Cardiogenic shock resolved 4. Coronary artery disease supportive care 5. Acute renal failure following labs   I have personally seen and evaluated the patient, evaluated laboratory and imaging results, formulated the assessment and plan and placed orders. The Patient requires high complexity decision making with multiple systems involvement.  Rounds were done with the Respiratory Therapy Director and  Staff therapists and discussed with nursing staff also.  Teresa Gee, MD [facility] Pulmonary Critical Care Medicine Sleep Medicine

## 2020-06-20 ENCOUNTER — Other Ambulatory Visit (HOSPITAL_COMMUNITY): Payer: 59

## 2020-06-20 DIAGNOSIS — I2510 Atherosclerotic heart disease of native coronary artery without angina pectoris: Secondary | ICD-10-CM | POA: Diagnosis not present

## 2020-06-20 DIAGNOSIS — J9621 Acute and chronic respiratory failure with hypoxia: Secondary | ICD-10-CM | POA: Diagnosis not present

## 2020-06-20 DIAGNOSIS — I483 Typical atrial flutter: Secondary | ICD-10-CM | POA: Diagnosis not present

## 2020-06-20 DIAGNOSIS — N170 Acute kidney failure with tubular necrosis: Secondary | ICD-10-CM | POA: Diagnosis not present

## 2020-06-20 LAB — CBC
HCT: 29.4 % — ABNORMAL LOW (ref 36.0–46.0)
Hemoglobin: 9 g/dL — ABNORMAL LOW (ref 12.0–15.0)
MCH: 29.1 pg (ref 26.0–34.0)
MCHC: 30.6 g/dL (ref 30.0–36.0)
MCV: 95.1 fL (ref 80.0–100.0)
Platelets: 300 10*3/uL (ref 150–400)
RBC: 3.09 MIL/uL — ABNORMAL LOW (ref 3.87–5.11)
RDW: 15.5 % (ref 11.5–15.5)
WBC: 10.8 10*3/uL — ABNORMAL HIGH (ref 4.0–10.5)
nRBC: 0 % (ref 0.0–0.2)

## 2020-06-20 LAB — BASIC METABOLIC PANEL
Anion gap: 10 (ref 5–15)
BUN: 20 mg/dL (ref 6–20)
CO2: 28 mmol/L (ref 22–32)
Calcium: 9.2 mg/dL (ref 8.9–10.3)
Chloride: 101 mmol/L (ref 98–111)
Creatinine, Ser: 0.61 mg/dL (ref 0.44–1.00)
GFR, Estimated: >60 mL/min (ref >60)
Glucose, Bld: 110 mg/dL — ABNORMAL HIGH (ref 70–99)
Potassium: 4.9 mmol/L (ref 3.5–5.1)
Sodium: 139 mmol/L (ref 135–145)

## 2020-06-20 LAB — MAGNESIUM: Magnesium: 1.9 mg/dL (ref 1.7–2.4)

## 2020-06-20 IMAGING — DX DG CHEST 1V PORT
1 series · 1 of 1 positions shown · non-contrast
Comparison: [DATE]

CLINICAL DATA: Respiratory failure and leukocytosis

EXAM:
PORTABLE CHEST 1 VIEW

[chest]
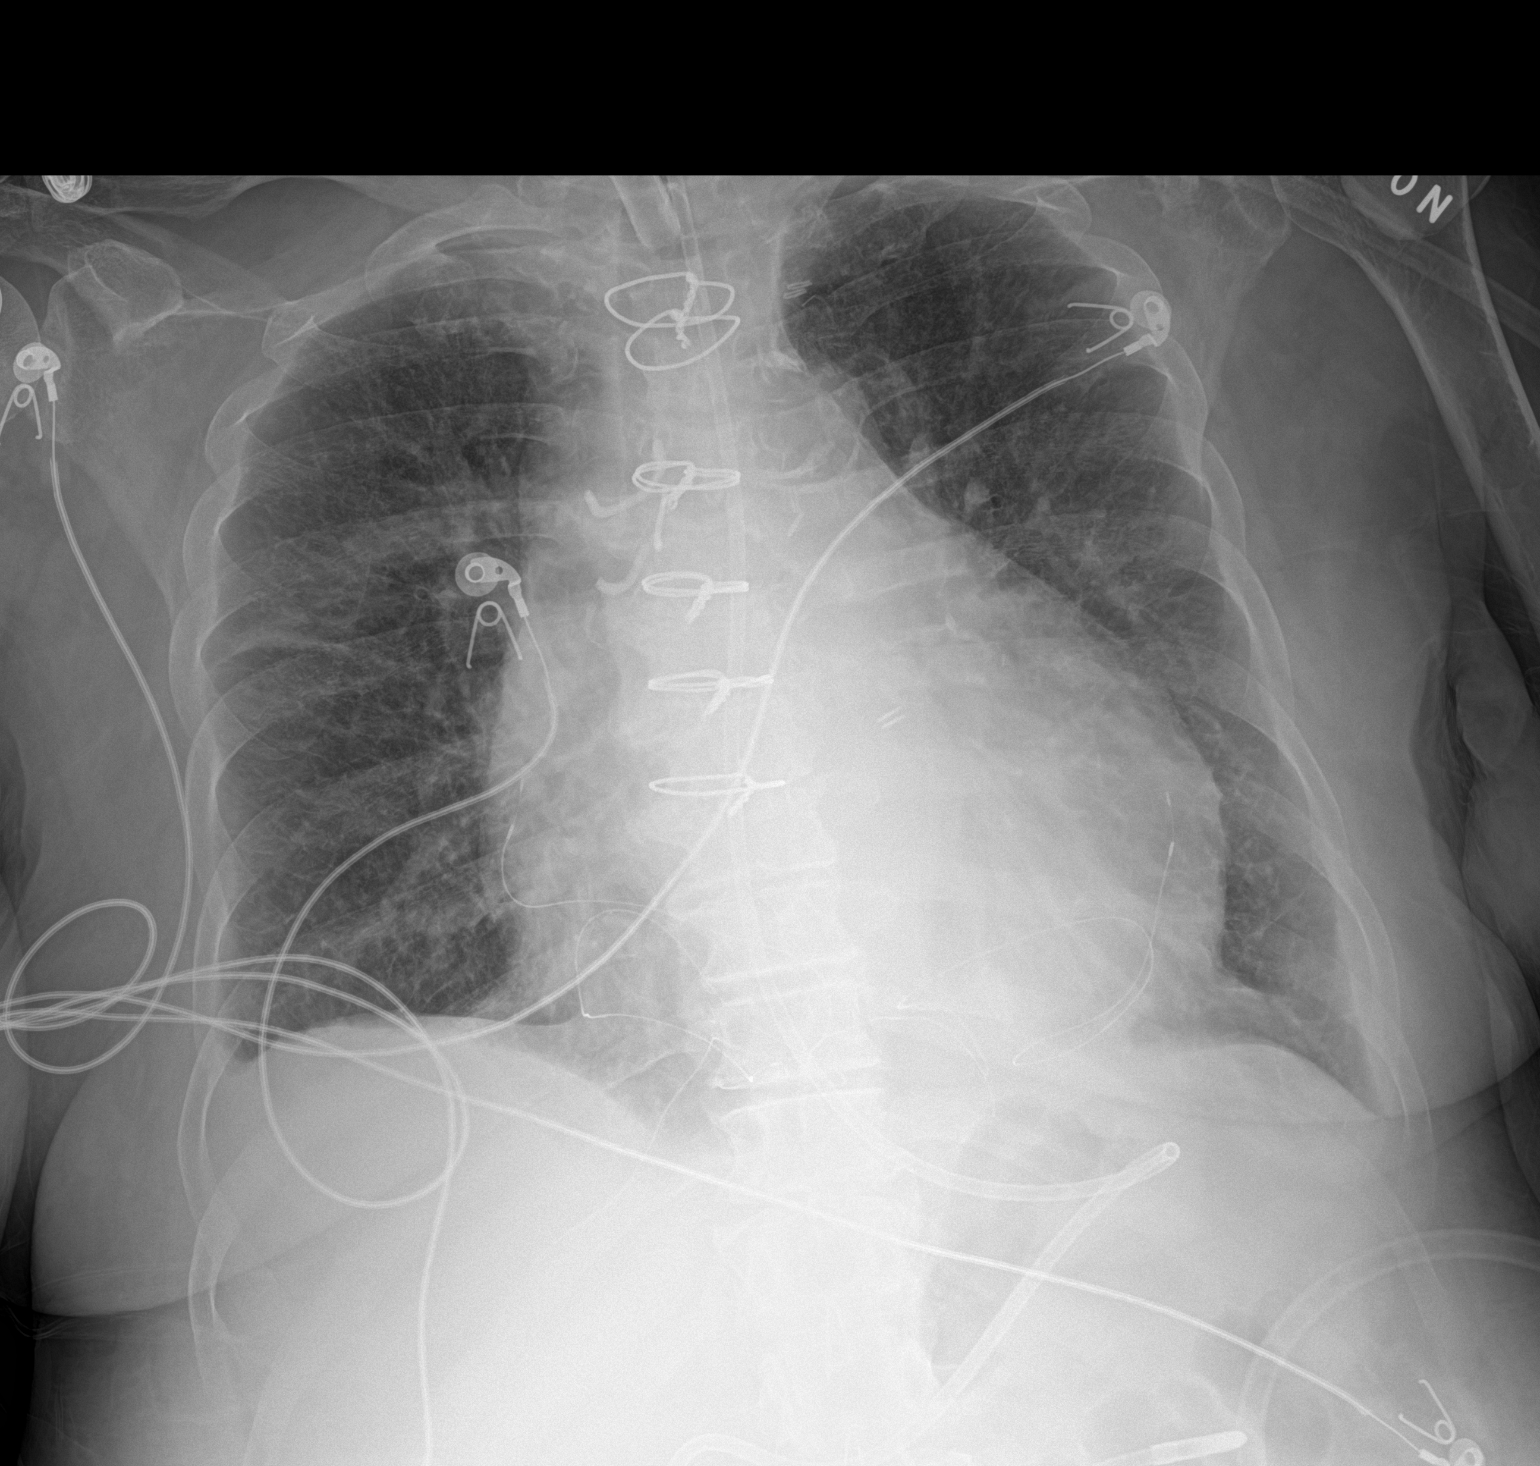

[1 of 1 positions shown; findings below may reference images not displayed]

FINDINGS: Tracheostomy catheter tip is 6.3 cm above the carina. Feeding tube
tip is in the distal duodenum. There are pacemaker wires attached to
the right heart, unchanged. No pneumothorax. There is persistent
mild interstitial edema. No consolidation. There is cardiomegaly
with mild pulmonary venous hypertension. No adenopathy. There is
aortic atherosclerosis. No bone lesions.
IMPRESSION: Tube and catheter positions as described without evident
pneumothorax. Cardiomegaly with pulmonary vascular congestion and
relatively mild although diffuse pulmonary edema. Appearance
consistent with volume overload. No consolidation. Postoperative
changes noted.

Aortic Atherosclerosis ([VS]-[VS]).

## 2020-06-20 NOTE — Progress Notes (Signed)
Pulmonary Critical Care Medicine [facility] [place]   PULMONARY CRITICAL CARE SERVICE  PROGRESS NOTE  Date of Service: 06/20/2020  Teresa [name]  ZOX:[id number]  DOB: 1960/09/26   DOA: 06/01/2020  Referring Physician: Carron Curie, MD  HPI: Teresa [name] is a 59 y.o. female seen for follow up of Acute on Chronic Respiratory Failure. Patient is capping today will be 24 hours right now is on 2 L of oxygen  Medications: Reviewed on Rounds  Physical Exam:  Vitals: Temperature 98.5 pulse 85 respiratory rate 16 blood pressure is 143/74 saturations 99%  Ventilator Settings capping on 2 L  . General: Comfortable at this time . Eyes: Grossly normal lids, irises & conjunctiva . ENT: grossly tongue is normal . Neck: no obvious mass . Cardiovascular: S1 S2 normal no gallop . Respiratory: No rhonchi no rales noted at this time . Abdomen: soft . Skin: no rash seen on limited exam . Musculoskeletal: not rigid . Psychiatric:unable to assess . Neurologic: no seizure no involuntary movements         Lab Data:   Basic Metabolic Panel: Recent Labs  Lab 06/13/20 1031 06/14/20 0621 06/15/20 0746 06/17/20 0434 06/18/20 0610 06/20/20 0454  NA 141 141  --  141  --  139  K 3.8 3.3* 3.7 4.3  --  4.9  CL 104 103  --  103  --  101  CO2 28 31  --  31  --  28  GLUCOSE 124* 114*  --  128*  --  110*  BUN 36* 35*  --  24*  --  20  CREATININE 0.65 0.59  --  0.56  --  0.61  CALCIUM 8.9 8.7*  --  8.8*  --  9.2  MG 1.8 1.8  --  1.7 2.1 1.9  PHOS 3.7 3.8  --  3.9  --   --     ABG: Recent Labs  Lab 06/14/20 0900  PHART 7.545*  PCO2ART 34.9  PO2ART 145*  HCO3 30.1*  O2SAT 99.4    Liver Function Tests: Recent Labs  Lab 06/13/20 1031 06/14/20 0621  ALBUMIN 2.3* 2.3*   No results for input(s): LIPASE, AMYLASE in the last 168 hours. No results for input(s): AMMONIA in the last 168 hours.  CBC: Recent Labs  Lab 06/13/20 1031 06/14/20 0621 06/17/20 0434 06/20/20 0454   WBC 12.6* 12.4* 11.7* 10.8*  HGB 8.7* 8.8* 8.8* 9.0*  HCT 28.2* 28.3* 28.2* 29.4*  MCV 93.1 92.8 92.8 95.1  PLT 291 290 307 300    Cardiac Enzymes: No results for input(s): CKTOTAL, CKMB, CKMBINDEX, TROPONINI in the last 168 hours.  BNP (last 3 results) No results for input(s): BNP in the last 8760 hours.  ProBNP (last 3 results) No results for input(s): PROBNP in the last 8760 hours.  Radiological Exams: DG CHEST PORT 1 VIEW  Result Date: 06/20/2020 CLINICAL DATA:  Respiratory failure and leukocytosis EXAM: PORTABLE CHEST 1 VIEW COMPARISON:  June 14, 2020 FINDINGS: Tracheostomy catheter tip is 6.3 cm above the carina. Feeding tube tip is in the distal duodenum. 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 No pneumothorax. There is persistent mild interstitial edema. No consolidation. There is cardiomegaly with mild pulmonary venous hypertension. No adenopathy. There is aortic atherosclerosis. No bone lesions. IMPRESSION: Tube and catheter positions as described without evident pneumothorax. Cardiomegaly with pulmonary vascular congestion and relatively mild although diffuse pulmonary edema. Appearance consistent with volume overload. No consolidation. Postoperative changes noted.  Aortic Atherosclerosis (ICD10-I70.0). Electronically Signed   By: Bretta Bang III M.D.   On: 06/20/2020 08:39    Assessment/Plan Active Problems:   Acute on chronic respiratory failure with hypoxia (HCC)   Atrial flutter (HCC)   Cardiogenic shock (HCC)   Coronary artery disease involving native coronary artery of native heart   Acute renal failure due to tubular necrosis (HCC)   1. Acute on chronic respiratory failure hypoxia we will continue with capping trials titrate oxygen continue pulmonary toilet 2. Atrial flutter rate is controlled at this time 3. Cardiogenic shock supportive care 4. Coronary artery disease at baseline 5. Acute renal failure following up on  labs   I have personally seen and evaluated the patient, evaluated laboratory and imaging results, formulated the assessment and plan and placed orders. The Patient requires high complexity decision making with multiple systems involvement.  Rounds were done with the Respiratory Therapy Director and Staff therapists and discussed with nursing staff also.  Yevonne Pax, MD [facility] Pulmonary Critical Care Medicine Sleep Medicine

## 2020-06-21 DIAGNOSIS — N170 Acute kidney failure with tubular necrosis: Secondary | ICD-10-CM | POA: Diagnosis not present

## 2020-06-21 DIAGNOSIS — J9621 Acute and chronic respiratory failure with hypoxia: Secondary | ICD-10-CM | POA: Diagnosis not present

## 2020-06-21 DIAGNOSIS — I2510 Atherosclerotic heart disease of native coronary artery without angina pectoris: Secondary | ICD-10-CM | POA: Diagnosis not present

## 2020-06-21 DIAGNOSIS — I483 Typical atrial flutter: Secondary | ICD-10-CM | POA: Diagnosis not present

## 2020-06-21 NOTE — Progress Notes (Signed)
Pulmonary Critical Care Medicine [facility] [place]   PULMONARY CRITICAL CARE SERVICE  PROGRESS NOTE  Date of Service: 06/21/2020  Teresa [name]  LOV:[id number]  DOB: 1961-01-17   DOA: 06/01/2020  Referring Physician: Carron Curie, MD  HPI: Teresa [name] is a 59 y.o. female seen for follow up of Acute on Chronic Respiratory Failure.  Patient is capping 2 L of oxygen today will be 48 hours  Medications: Reviewed on Rounds  Physical Exam:  Vitals: Temperature is 98.2 pulse 89 respiratory rate 24 blood pressure is 130/73 saturations 99%  Ventilator Settings capping on 2 L  . General: Comfortable at this time . Eyes: Grossly normal lids, irises & conjunctiva . ENT: grossly tongue is normal . Neck: no obvious mass . Cardiovascular: S1 S2 normal no gallop . Respiratory: No rhonchi no rales are noted at this time . Abdomen: soft . Skin: no rash seen on limited exam . Musculoskeletal: not rigid . Psychiatric:unable to assess . Neurologic: no seizure no involuntary movements         Lab Data:   Basic Metabolic Panel: Recent Labs  Lab 06/15/20 0746 06/17/20 0434 06/18/20 0610 06/20/20 0454  NA  --  141  --  139  K 3.7 4.3  --  4.9  CL  --  103  --  101  CO2  --  31  --  28  GLUCOSE  --  128*  --  110*  BUN  --  24*  --  20  CREATININE  --  0.56  --  0.61  CALCIUM  --  8.8*  --  9.2  MG  --  1.7 2.1 1.9  PHOS  --  3.9  --   --     ABG: No results for input(s): PHART, PCO2ART, PO2ART, HCO3, O2SAT in the last 168 hours.  Liver Function Tests: No results for input(s): AST, ALT, ALKPHOS, BILITOT, PROT, ALBUMIN in the last 168 hours. No results for input(s): LIPASE, AMYLASE in the last 168 hours. No results for input(s): AMMONIA in the last 168 hours.  CBC: Recent Labs  Lab 06/17/20 0434 06/20/20 0454  WBC 11.7* 10.8*  HGB 8.8* 9.0*  HCT 28.2* 29.4*  MCV 92.8 95.1  PLT 307 300    Cardiac Enzymes: No results for input(s): CKTOTAL, CKMB,  CKMBINDEX, TROPONINI in the last 168 hours.  BNP (last 3 results) No results for input(s): BNP in the last 8760 hours.  ProBNP (last 3 results) No results for input(s): PROBNP in the last 8760 hours.  Radiological Exams: DG CHEST PORT 1 VIEW  Result Date: 06/20/2020 CLINICAL DATA:  Respiratory failure and leukocytosis EXAM: PORTABLE CHEST 1 VIEW COMPARISON:  June 14, 2020 FINDINGS: Tracheostomy catheter tip is 6.3 cm above the carina. Feeding tube tip is in the distal duodenum. 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 No pneumothorax. There is persistent mild interstitial edema. No consolidation. There is cardiomegaly with mild pulmonary venous hypertension. No adenopathy. There is aortic atherosclerosis. No bone lesions. IMPRESSION: Tube and catheter positions as described without evident pneumothorax. Cardiomegaly with pulmonary vascular congestion and relatively mild although diffuse pulmonary edema. Appearance consistent with volume overload. No consolidation. Postoperative changes noted. Aortic Atherosclerosis (ICD10-I70.0). Electronically Signed   By: Bretta Bang III M.D.   On: 06/20/2020 08:39    Assessment/Plan Active Problems:   Acute on chronic respiratory failure with hypoxia [facility])   Atrial flutter (HCC)   Cardiogenic shock (HCC)   Coronary  artery disease involving native coronary artery of native heart   Acute renal failure due to tubular necrosis (HCC)   1. Acute on chronic respiratory failure hypoxia continue with capping trials patient is on goal of 48 hours 2. Atrial flutter rate is controlled we will continue to monitor 3. Coronary artery disease at baseline 4. Acute renal failure labs are stable continue to monitor   I have personally seen and evaluated the patient, evaluated laboratory and imaging results, formulated the assessment and plan and placed orders. The Patient requires high complexity decision making with multiple systems  involvement.  Rounds were done with the Respiratory Therapy Director and Staff therapists and discussed with nursing staff also.  Yevonne Pax, MD [facility] Pulmonary Critical Care Medicine Sleep Medicine

## 2020-06-22 DIAGNOSIS — N170 Acute kidney failure with tubular necrosis: Secondary | ICD-10-CM | POA: Diagnosis not present

## 2020-06-22 DIAGNOSIS — I2510 Atherosclerotic heart disease of native coronary artery without angina pectoris: Secondary | ICD-10-CM | POA: Diagnosis not present

## 2020-06-22 DIAGNOSIS — I483 Typical atrial flutter: Secondary | ICD-10-CM | POA: Diagnosis not present

## 2020-06-22 DIAGNOSIS — J9621 Acute and chronic respiratory failure with hypoxia: Secondary | ICD-10-CM | POA: Diagnosis not present

## 2020-06-22 LAB — CBC
HCT: 28.7 % — ABNORMAL LOW (ref 36.0–46.0)
Hemoglobin: 8.7 g/dL — ABNORMAL LOW (ref 12.0–15.0)
MCH: 28.9 pg (ref 26.0–34.0)
MCHC: 30.3 g/dL (ref 30.0–36.0)
MCV: 95.3 fL (ref 80.0–100.0)
Platelets: 287 10*3/uL (ref 150–400)
RBC: 3.01 MIL/uL — ABNORMAL LOW (ref 3.87–5.11)
RDW: 15.3 % (ref 11.5–15.5)
WBC: 9 10*3/uL (ref 4.0–10.5)
nRBC: 0 % (ref 0.0–0.2)

## 2020-06-22 LAB — BASIC METABOLIC PANEL
Anion gap: 7 (ref 5–15)
BUN: 28 mg/dL — ABNORMAL HIGH (ref 6–20)
CO2: 30 mmol/L (ref 22–32)
Calcium: 9.5 mg/dL (ref 8.9–10.3)
Chloride: 100 mmol/L (ref 98–111)
Creatinine, Ser: 0.6 mg/dL (ref 0.44–1.00)
GFR, Estimated: >60 mL/min (ref >60)
Glucose, Bld: 97 mg/dL (ref 70–99)
Potassium: 5 mmol/L (ref 3.5–5.1)
Sodium: 137 mmol/L (ref 135–145)

## 2020-06-22 NOTE — Progress Notes (Signed)
Pulmonary Critical Care Medicine [facility] [place]   PULMONARY CRITICAL CARE SERVICE  PROGRESS NOTE  Date of Service: 06/22/2020  Teresa [name]  IBB:[id number]  DOB: Jul 29, 1961   DOA: 06/01/2020  Referring Physician: Carron Curie, MD  HPI: Teresa [name] is a 59 y.o. female seen for follow up of Acute on Chronic Respiratory Failure.  Patient currently is doing well with capping ready for decannulation  Medications: Reviewed on Rounds  Physical Exam:  Vitals: Temperature 96.6 pulse 83 respiratory rate 34 blood pressure is 129/89 saturations 99%  Ventilator Settings capping off the ventilator  . General: Comfortable at this time . Eyes: Grossly normal lids, irises & conjunctiva . ENT: grossly tongue is normal . Neck: no obvious mass . Cardiovascular: S1 S2 normal no gallop . Respiratory: No rhonchi no rales . Abdomen: soft . Skin: no rash seen on limited exam . Musculoskeletal: not rigid . Psychiatric:unable to assess . Neurologic: no seizure no involuntary movements         Lab Data:   Basic Metabolic Panel: Recent Labs  Lab 06/17/20 0434 06/18/20 0610 06/20/20 0454 06/22/20 0622  NA 141  --  139 137  K 4.3  --  4.9 5.0  CL 103  --  101 100  CO2 31  --  28 30  GLUCOSE 128*  --  110* 97  BUN 24*  --  20 28*  CREATININE 0.56  --  0.61 0.60  CALCIUM 8.8*  --  9.2 9.5  MG 1.7 2.1 1.9  --   PHOS 3.9  --   --   --     ABG: No results for input(s): PHART, PCO2ART, PO2ART, HCO3, O2SAT in the last 168 hours.  Liver Function Tests: No results for input(s): AST, ALT, ALKPHOS, BILITOT, PROT, ALBUMIN in the last 168 hours. No results for input(s): LIPASE, AMYLASE in the last 168 hours. No results for input(s): AMMONIA in the last 168 hours.  CBC: Recent Labs  Lab 06/17/20 0434 06/20/20 0454 06/22/20 0622  WBC 11.7* 10.8* 9.0  HGB 8.8* 9.0* 8.7*  HCT 28.2* 29.4* 28.7*  MCV 92.8 95.1 95.3  PLT 307 300 287    Cardiac Enzymes: No results for  input(s): CKTOTAL, CKMB, CKMBINDEX, TROPONINI in the last 168 hours.  BNP (last 3 results) No results for input(s): BNP in the last 8760 hours.  ProBNP (last 3 results) No results for input(s): PROBNP in the last 8760 hours.  Radiological Exams: No results found.  Assessment/Plan Active Problems:   Acute on chronic respiratory failure with hypoxia (HCC)   Atrial flutter (HCC)   Cardiogenic shock (HCC)   Coronary artery disease involving native coronary artery of native heart   Acute renal failure due to tubular necrosis (HCC)   1. Acute on chronic respiratory failure with hypoxia we will proceed to decannulation today 2. Atrial flutter no change continue with supportive care 3. Cardiogenic shock at baseline 4. Coronary artery disease at baseline 5. Acute renal failure we will continue to monitor   I have personally seen and evaluated the patient, evaluated laboratory and imaging results, formulated the assessment and plan and placed orders. The Patient requires high complexity decision making with multiple systems involvement.  Rounds were done with the Respiratory Therapy Director and Staff therapists and discussed with nursing staff also.  Yevonne Pax, MD [facility] Pulmonary Critical Care Medicine Sleep Medicine

## 2020-06-23 DIAGNOSIS — I483 Typical atrial flutter: Secondary | ICD-10-CM | POA: Diagnosis not present

## 2020-06-23 DIAGNOSIS — N170 Acute kidney failure with tubular necrosis: Secondary | ICD-10-CM | POA: Diagnosis not present

## 2020-06-23 DIAGNOSIS — J9621 Acute and chronic respiratory failure with hypoxia: Secondary | ICD-10-CM | POA: Diagnosis not present

## 2020-06-23 DIAGNOSIS — I2510 Atherosclerotic heart disease of native coronary artery without angina pectoris: Secondary | ICD-10-CM | POA: Diagnosis not present

## 2020-06-23 NOTE — Progress Notes (Signed)
Pulmonary Critical Care Medicine [facility] [place]   PULMONARY CRITICAL CARE SERVICE  PROGRESS NOTE  Date of Service: 06/23/2020  Teresa [name]  WCB:[id number]  DOB: 1960/09/25   DOA: 06/01/2020  Referring Physician: Carron Curie, MD  HPI: Teresa [name] is a 59 y.o. female seen for follow up of Acute on Chronic Respiratory Failure.  Patient is doing well after decannulation  Medications: Reviewed on Rounds  Physical Exam:  Vitals: Temperature is 97.4 pulse 87 respiratory rate is 34 blood pressure is 154/82 saturations 99%  Ventilator Settings decannulated  . General: Comfortable at this time . Eyes: Grossly normal lids, irises & conjunctiva . ENT: grossly tongue is normal . Neck: no obvious mass . Cardiovascular: S1 S2 normal no gallop . Respiratory: No rhonchi very coarse breath sounds . Abdomen: soft . Skin: no rash seen on limited exam . Musculoskeletal: not rigid . Psychiatric:unable to assess . Neurologic: no seizure no involuntary movements         Lab Data:   Basic Metabolic Panel: Recent Labs  Lab 06/17/20 0434 06/18/20 0610 06/20/20 0454 06/22/20 0622  NA 141  --  139 137  K 4.3  --  4.9 5.0  CL 103  --  101 100  CO2 31  --  28 30  GLUCOSE 128*  --  110* 97  BUN 24*  --  20 28*  CREATININE 0.56  --  0.61 0.60  CALCIUM 8.8*  --  9.2 9.5  MG 1.7 2.1 1.9  --   PHOS 3.9  --   --   --     ABG: No results for input(s): PHART, PCO2ART, PO2ART, HCO3, O2SAT in the last 168 hours.  Liver Function Tests: No results for input(s): AST, ALT, ALKPHOS, BILITOT, PROT, ALBUMIN in the last 168 hours. No results for input(s): LIPASE, AMYLASE in the last 168 hours. No results for input(s): AMMONIA in the last 168 hours.  CBC: Recent Labs  Lab 06/17/20 0434 06/20/20 0454 06/22/20 0622  WBC 11.7* 10.8* 9.0  HGB 8.8* 9.0* 8.7*  HCT 28.2* 29.4* 28.7*  MCV 92.8 95.1 95.3  PLT 307 300 287    Cardiac Enzymes: No results for input(s): CKTOTAL,  CKMB, CKMBINDEX, TROPONINI in the last 168 hours.  BNP (last 3 results) No results for input(s): BNP in the last 8760 hours.  ProBNP (last 3 results) No results for input(s): PROBNP in the last 8760 hours.  Radiological Exams: No results found.  Assessment/Plan Active Problems:   Acute on chronic respiratory failure with hypoxia (HCC)   Atrial flutter (HCC)   Cardiogenic shock (HCC)   Coronary artery disease involving native coronary artery of native heart   Acute renal failure due to tubular necrosis (HCC)   1. Acute on chronic respiratory failure with hypoxia we'll continue with supportive care oxygen as needed 2. Atrial flutter rate controlled we'll continue to monitor 3. Cardiogenic shock no change 4. Coronary artery disease at baseline 5. Acute renal failure following up on labs   I have personally seen and evaluated the patient, evaluated laboratory and imaging results, formulated the assessment and plan and placed orders. The Patient requires high complexity decision making with multiple systems involvement.  Rounds were done with the Respiratory Therapy Director and Staff therapists and discussed with nursing staff also.  Yevonne Pax, MD [facility] Pulmonary Critical Care Medicine Sleep Medicine

## 2020-06-26 LAB — CBC
HCT: 29.7 % — ABNORMAL LOW (ref 36.0–46.0)
Hemoglobin: 9.2 g/dL — ABNORMAL LOW (ref 12.0–15.0)
MCH: 28.8 pg (ref 26.0–34.0)
MCHC: 31 g/dL (ref 30.0–36.0)
MCV: 92.8 fL (ref 80.0–100.0)
Platelets: 287 10*3/uL (ref 150–400)
RBC: 3.2 MIL/uL — ABNORMAL LOW (ref 3.87–5.11)
RDW: 15.3 % (ref 11.5–15.5)
WBC: 7 10*3/uL (ref 4.0–10.5)
nRBC: 0 % (ref 0.0–0.2)

## 2020-06-26 LAB — BASIC METABOLIC PANEL
Anion gap: 7 (ref 5–15)
BUN: 13 mg/dL (ref 6–20)
CO2: 31 mmol/L (ref 22–32)
Calcium: 9.2 mg/dL (ref 8.9–10.3)
Chloride: 100 mmol/L (ref 98–111)
Creatinine, Ser: 0.76 mg/dL (ref 0.44–1.00)
GFR, Estimated: >60 mL/min (ref >60)
Glucose, Bld: 110 mg/dL — ABNORMAL HIGH (ref 70–99)
Potassium: 3.6 mmol/L (ref 3.5–5.1)
Sodium: 138 mmol/L (ref 135–145)

## 2020-06-26 LAB — MAGNESIUM: Magnesium: 1.5 mg/dL — ABNORMAL LOW (ref 1.7–2.4)

## 2020-06-27 LAB — MAGNESIUM: Magnesium: 2.1 mg/dL (ref 1.7–2.4)

## 2020-06-29 ENCOUNTER — Other Ambulatory Visit (HOSPITAL_COMMUNITY): Payer: 59

## 2020-06-29 LAB — CBC
HCT: 29.4 % — ABNORMAL LOW (ref 36.0–46.0)
Hemoglobin: 9.1 g/dL — ABNORMAL LOW (ref 12.0–15.0)
MCH: 28.7 pg (ref 26.0–34.0)
MCHC: 31 g/dL (ref 30.0–36.0)
MCV: 92.7 fL (ref 80.0–100.0)
Platelets: 278 10*3/uL (ref 150–400)
RBC: 3.17 MIL/uL — ABNORMAL LOW (ref 3.87–5.11)
RDW: 15.4 % (ref 11.5–15.5)
WBC: 7.8 10*3/uL (ref 4.0–10.5)
nRBC: 0 % (ref 0.0–0.2)

## 2020-06-29 LAB — RENAL FUNCTION PANEL
Albumin: 2.9 g/dL — ABNORMAL LOW (ref 3.5–5.0)
Anion gap: 8 (ref 5–15)
BUN: 13 mg/dL (ref 6–20)
CO2: 28 mmol/L (ref 22–32)
Calcium: 9.3 mg/dL (ref 8.9–10.3)
Chloride: 103 mmol/L (ref 98–111)
Creatinine, Ser: 0.83 mg/dL (ref 0.44–1.00)
GFR, Estimated: >60 mL/min (ref >60)
Glucose, Bld: 105 mg/dL — ABNORMAL HIGH (ref 70–99)
Phosphorus: 5.7 mg/dL — ABNORMAL HIGH (ref 2.5–4.6)
Potassium: 3.6 mmol/L (ref 3.5–5.1)
Sodium: 139 mmol/L (ref 135–145)

## 2020-06-29 LAB — BLOOD GAS, ARTERIAL
Acid-Base Excess: 5.8 mmol/L — ABNORMAL HIGH (ref 0.0–2.0)
Bicarbonate: 30.9 mmol/L — ABNORMAL HIGH (ref 20.0–28.0)
FIO2: 24
O2 Saturation: 96.3 %
Patient temperature: 37
pCO2 arterial: 54.9 mmHg — ABNORMAL HIGH (ref 32.0–48.0)
pH, Arterial: 7.369 (ref 7.350–7.450)
pO2, Arterial: 87.4 mmHg (ref 83.0–108.0)

## 2020-06-29 LAB — MAGNESIUM: Magnesium: 1.7 mg/dL (ref 1.7–2.4)

## 2020-06-29 IMAGING — CT CT HEAD W/O CM
3 series · 14 of 47 positions shown, 16 images · non-contrast
Comparison: CT max face [DATE], CT head [DATE] the

CLINICAL DATA: Mental status change, unknown cause.

EXAM:
CT HEAD WITHOUT CONTRAST
TECHNIQUE: Contiguous axial images were obtained from the base of the skull
through the vertex without intravenous contrast.

[Series 3: head 5.0 h30s · axial · 0.47mm/px · z∈[-174,-39]mm · 8 of 33 slices shown, 10 images]
[im 3/33  brain]
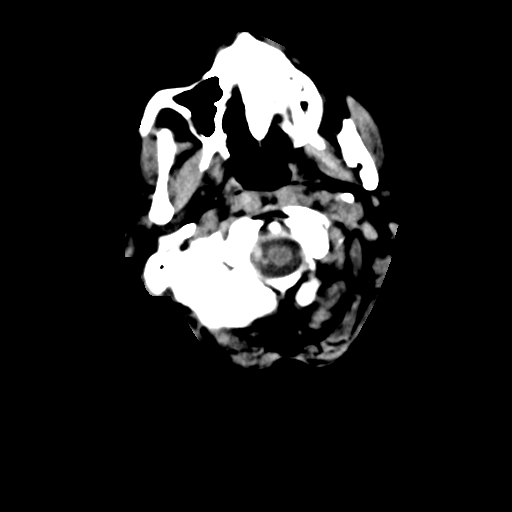
[im 3/33  bone]
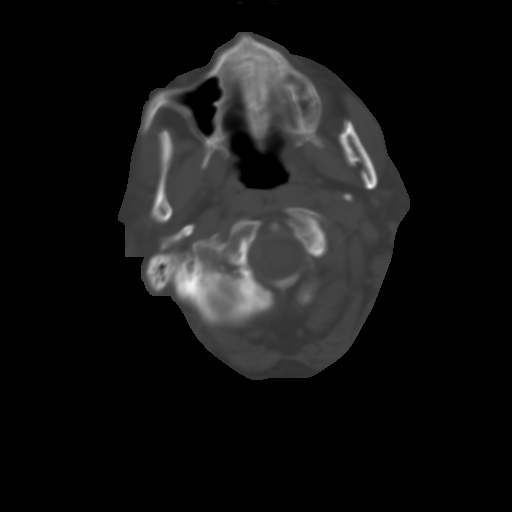
[im 7/33  brain]
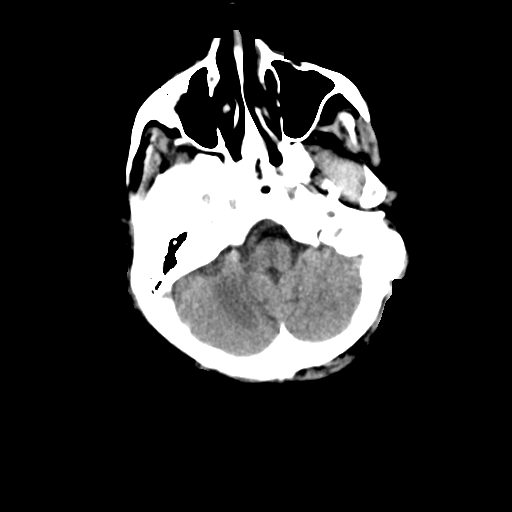
[im 10/33  brain]
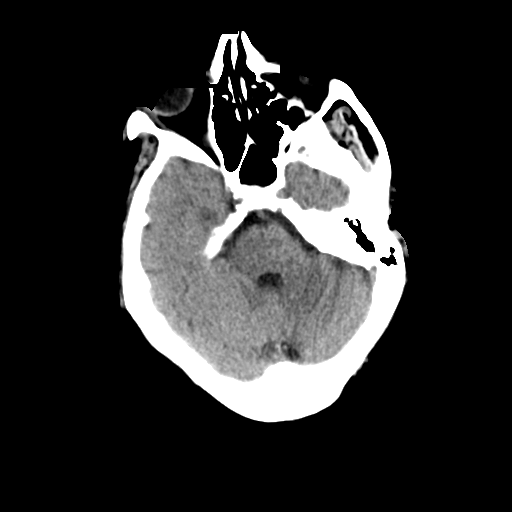
[im 15/33  brain]
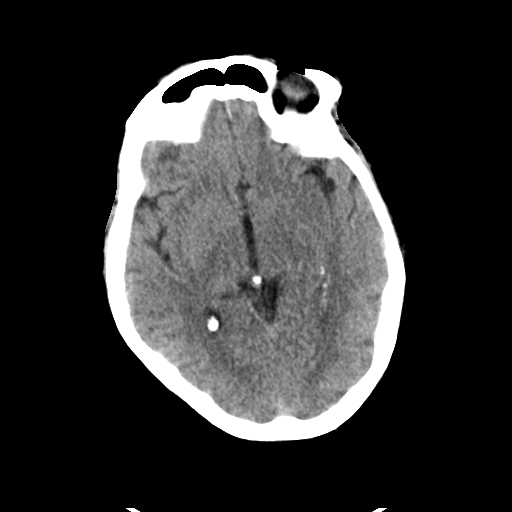
[im 18/33  brain]
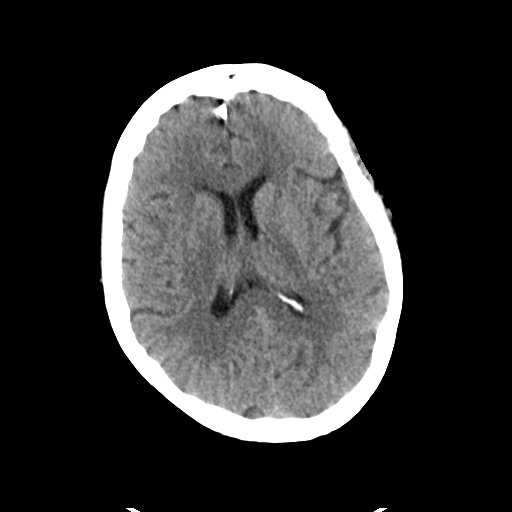
[im 18/33  bone]
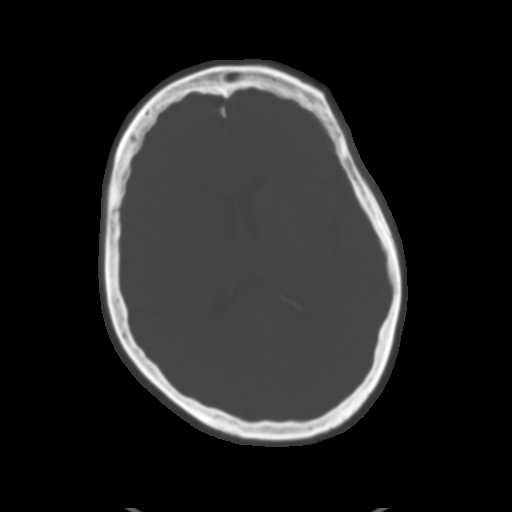
[im 23/33  brain]
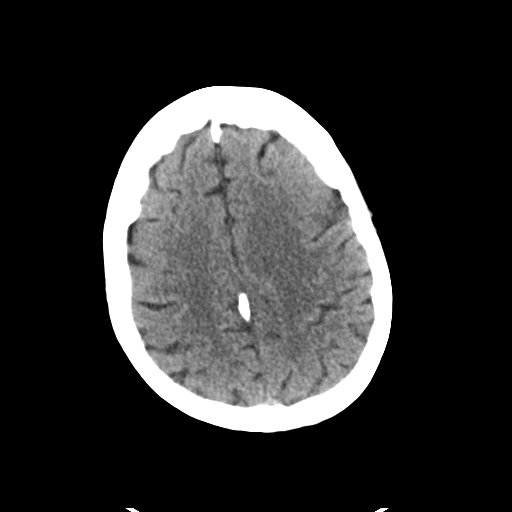
[im 26/33  brain]
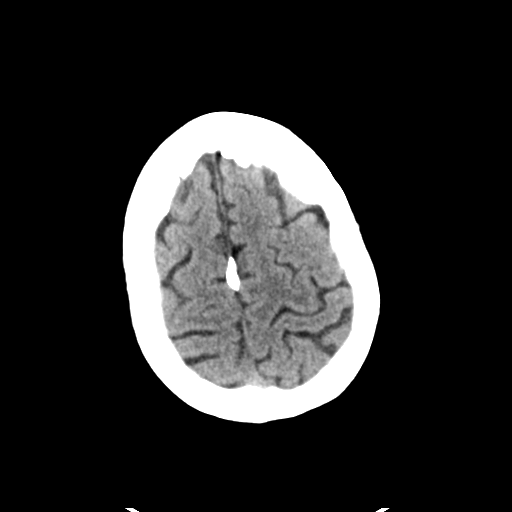
[im 30/33  brain]
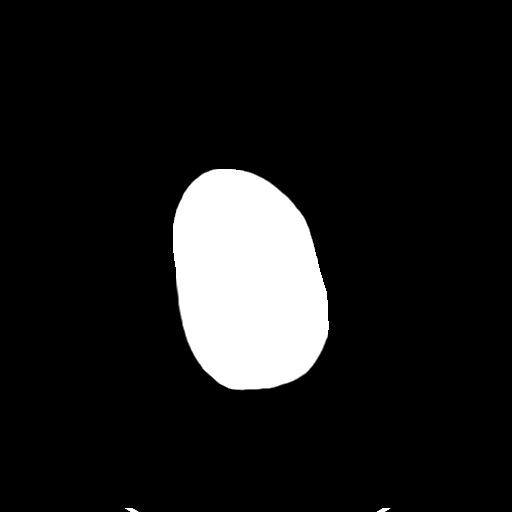

[Series 5: head 3.0 mpr cor · coronal · 0.32mm/px · 3 of 72 slices shown]
[im 24/72  brain]
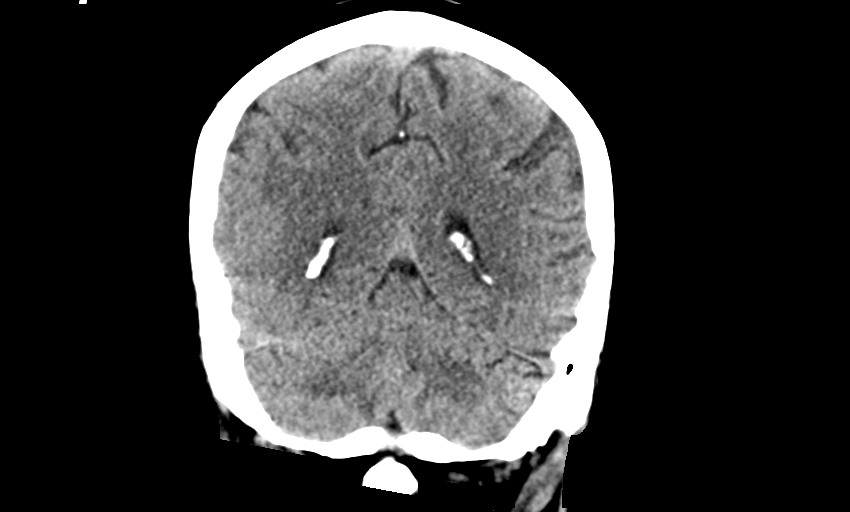
[im 32/72  brain]
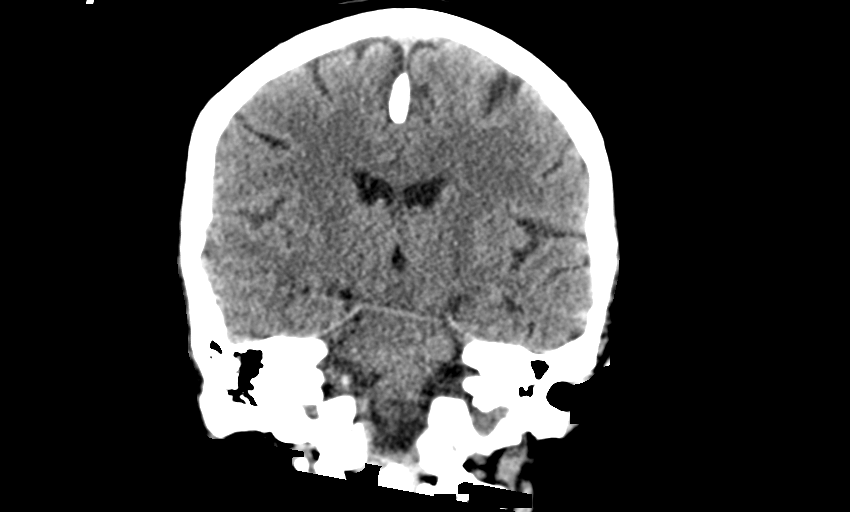
[im 40/72  brain]
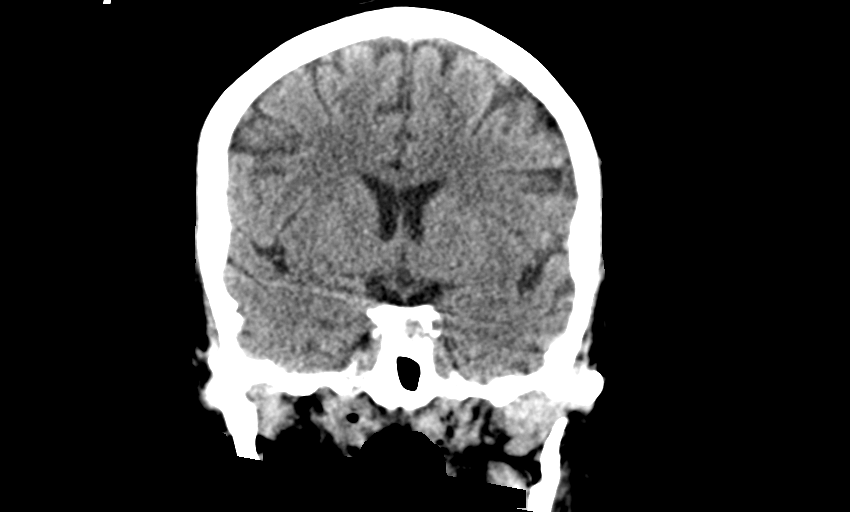

[Series 6: head 3.0 mpr sag · sagittal · 0.32mm/px · 3 of 66 slices shown]
[im 27/66  brain]
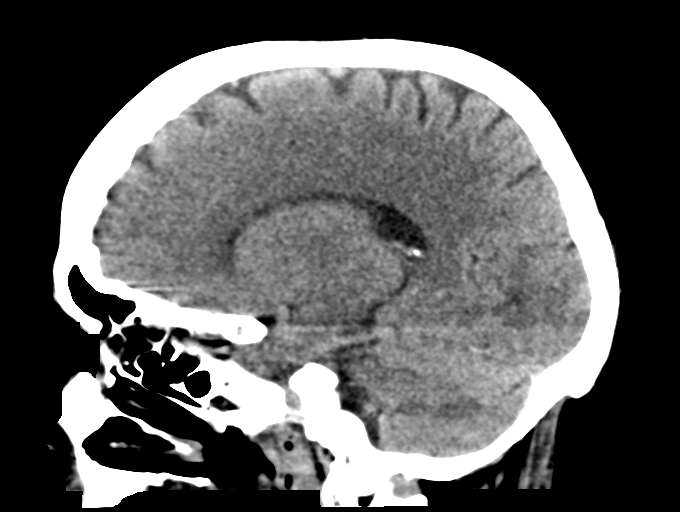
[im 33/66  brain]
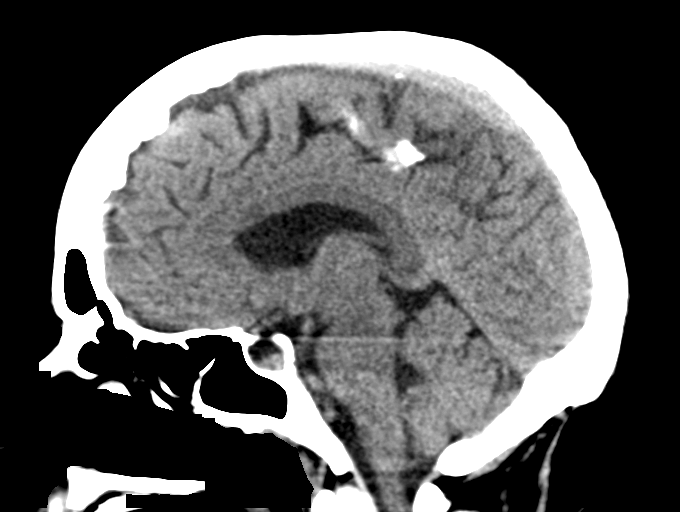
[im 38/66  brain]
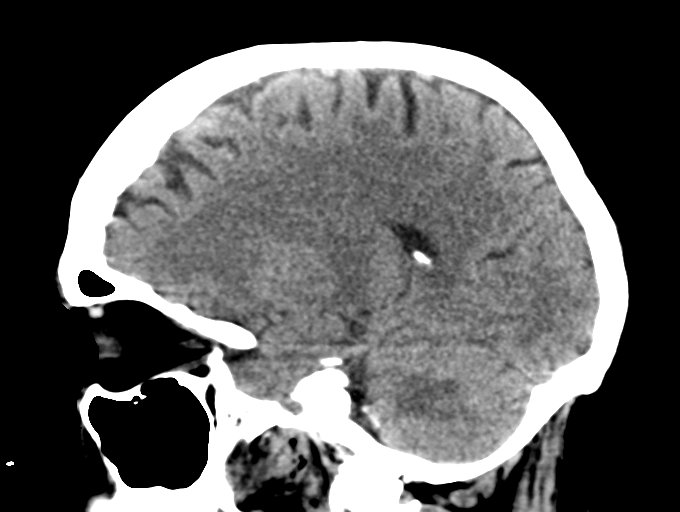

[14 of 47 positions shown; findings below may reference images not displayed]

FINDINGS: Brain:

No evidence of large-territorial acute infarction. No parenchymal
hemorrhage. No mass lesion. No extra-axial collection.

No mass effect or midline shift. No hydrocephalus. Basilar cisterns
are patent.

Vascular: No hyperdense vessel. Atherosclerotic calcifications are
present within the cavernous internal carotid and vertebral
arteries.

Skull: No acute fracture or focal lesion.

Sinuses/Orbits: Interval development of partial opacification of
bilateral mastoid air cells. Paranasal sinuses are clear. The orbits
are unremarkable.

Other: Similar-appearing prominent intraparotid left lymph nodes
measuring up to 0.6 cm.
IMPRESSION: 1. No acute intracranial abnormality.
2. New partial opacification of bilateral mastoid air cells.

## 2020-06-30 LAB — MAGNESIUM: Magnesium: 2.1 mg/dL (ref 1.7–2.4)

## 2020-07-01 LAB — URINALYSIS, ROUTINE W REFLEX MICROSCOPIC
Bilirubin Urine: NEGATIVE
Glucose, UA: NEGATIVE mg/dL
Hgb urine dipstick: NEGATIVE
Ketones, ur: NEGATIVE mg/dL
Leukocytes,Ua: NEGATIVE
Nitrite: NEGATIVE
Protein, ur: 100 mg/dL — AB
Specific Gravity, Urine: 1.021 (ref 1.005–1.030)
pH: 5 (ref 5.0–8.0)

## 2020-07-01 LAB — EXPECTORATED SPUTUM ASSESSMENT W GRAM STAIN, RFLX TO RESP C

## 2020-07-02 LAB — CBC
HCT: 27.9 % — ABNORMAL LOW (ref 36.0–46.0)
Hemoglobin: 8.7 g/dL — ABNORMAL LOW (ref 12.0–15.0)
MCH: 28.7 pg (ref 26.0–34.0)
MCHC: 31.2 g/dL (ref 30.0–36.0)
MCV: 92.1 fL (ref 80.0–100.0)
Platelets: 274 10*3/uL (ref 150–400)
RBC: 3.03 MIL/uL — ABNORMAL LOW (ref 3.87–5.11)
RDW: 15.7 % — ABNORMAL HIGH (ref 11.5–15.5)
WBC: 6.8 10*3/uL (ref 4.0–10.5)
nRBC: 0 % (ref 0.0–0.2)

## 2020-07-02 LAB — BASIC METABOLIC PANEL
Anion gap: 11 (ref 5–15)
BUN: 18 mg/dL (ref 6–20)
CO2: 27 mmol/L (ref 22–32)
Calcium: 8.9 mg/dL (ref 8.9–10.3)
Chloride: 100 mmol/L (ref 98–111)
Creatinine, Ser: 0.63 mg/dL (ref 0.44–1.00)
GFR, Estimated: >60 mL/min (ref >60)
Glucose, Bld: 95 mg/dL (ref 70–99)
Potassium: 3.5 mmol/L (ref 3.5–5.1)
Sodium: 138 mmol/L (ref 135–145)

## 2020-07-02 LAB — PHOSPHORUS: Phosphorus: 4.2 mg/dL (ref 2.5–4.6)

## 2020-07-02 LAB — MAGNESIUM: Magnesium: 1.8 mg/dL (ref 1.7–2.4)

## 2020-07-03 LAB — URINE CULTURE

## 2020-07-03 LAB — CULTURE, RESPIRATORY W GRAM STAIN: Culture: NORMAL

## 2020-07-05 LAB — BASIC METABOLIC PANEL
Anion gap: 9 (ref 5–15)
BUN: 15 mg/dL (ref 6–20)
CO2: 29 mmol/L (ref 22–32)
Calcium: 8.9 mg/dL (ref 8.9–10.3)
Chloride: 99 mmol/L (ref 98–111)
Creatinine, Ser: 0.65 mg/dL (ref 0.44–1.00)
GFR, Estimated: >60 mL/min (ref >60)
Glucose, Bld: 112 mg/dL — ABNORMAL HIGH (ref 70–99)
Potassium: 3.4 mmol/L — ABNORMAL LOW (ref 3.5–5.1)
Sodium: 137 mmol/L (ref 135–145)

## 2020-07-05 LAB — CBC
HCT: 28.4 % — ABNORMAL LOW (ref 36.0–46.0)
Hemoglobin: 8.9 g/dL — ABNORMAL LOW (ref 12.0–15.0)
MCH: 28.8 pg (ref 26.0–34.0)
MCHC: 31.3 g/dL (ref 30.0–36.0)
MCV: 91.9 fL (ref 80.0–100.0)
Platelets: 345 10*3/uL (ref 150–400)
RBC: 3.09 MIL/uL — ABNORMAL LOW (ref 3.87–5.11)
RDW: 15.4 % (ref 11.5–15.5)
WBC: 8.2 10*3/uL (ref 4.0–10.5)
nRBC: 0 % (ref 0.0–0.2)

## 2020-07-05 LAB — PHOSPHORUS: Phosphorus: 3.4 mg/dL (ref 2.5–4.6)

## 2020-07-05 LAB — MAGNESIUM: Magnesium: 1.5 mg/dL — ABNORMAL LOW (ref 1.7–2.4)

## 2020-07-06 LAB — MAGNESIUM: Magnesium: 2 mg/dL (ref 1.7–2.4)

## 2020-07-06 LAB — LEVETIRACETAM LEVEL: Levetiracetam Lvl: 29.3 ug/mL (ref 10.0–40.0)

## 2020-07-06 LAB — POTASSIUM: Potassium: 4 mmol/L (ref 3.5–5.1)

## 2020-07-07 ENCOUNTER — Other Ambulatory Visit (HOSPITAL_COMMUNITY): Payer: 59

## 2020-07-07 LAB — CBC
HCT: 26.1 % — ABNORMAL LOW (ref 36.0–46.0)
Hemoglobin: 8.1 g/dL — ABNORMAL LOW (ref 12.0–15.0)
MCH: 28.6 pg (ref 26.0–34.0)
MCHC: 31 g/dL (ref 30.0–36.0)
MCV: 92.2 fL (ref 80.0–100.0)
Platelets: 297 10*3/uL (ref 150–400)
RBC: 2.83 MIL/uL — ABNORMAL LOW (ref 3.87–5.11)
RDW: 15.3 % (ref 11.5–15.5)
WBC: 6.5 10*3/uL (ref 4.0–10.5)
nRBC: 0 % (ref 0.0–0.2)

## 2020-07-07 LAB — BASIC METABOLIC PANEL
Anion gap: 6 (ref 5–15)
BUN: 11 mg/dL (ref 6–20)
CO2: 25 mmol/L (ref 22–32)
Calcium: 6.9 mg/dL — ABNORMAL LOW (ref 8.9–10.3)
Chloride: 110 mmol/L (ref 98–111)
Creatinine, Ser: 0.66 mg/dL (ref 0.44–1.00)
GFR, Estimated: >60 mL/min (ref >60)
Glucose, Bld: 86 mg/dL (ref 70–99)
Potassium: 3.3 mmol/L — ABNORMAL LOW (ref 3.5–5.1)
Sodium: 141 mmol/L (ref 135–145)

## 2020-07-07 LAB — URINALYSIS, ROUTINE W REFLEX MICROSCOPIC
Bacteria, UA: NONE SEEN
Bilirubin Urine: NEGATIVE
Glucose, UA: NEGATIVE mg/dL
Hgb urine dipstick: NEGATIVE
Ketones, ur: NEGATIVE mg/dL
Leukocytes,Ua: NEGATIVE
Nitrite: NEGATIVE
Protein, ur: 100 mg/dL — AB
Specific Gravity, Urine: 1.016 (ref 1.005–1.030)
pH: 7 (ref 5.0–8.0)

## 2020-07-07 LAB — LACTIC ACID, PLASMA: Lactic Acid, Venous: 0.7 mmol/L (ref 0.5–1.9)

## 2020-07-07 LAB — MAGNESIUM: Magnesium: 1.2 mg/dL — ABNORMAL LOW (ref 1.7–2.4)

## 2020-07-07 IMAGING — DX DG CHEST 1V PORT
1 series · 1 of 1 positions shown · non-contrast
Comparison: [DATE].

CLINICAL DATA: Fever.

EXAM:
PORTABLE CHEST 1 VIEW

[chest]
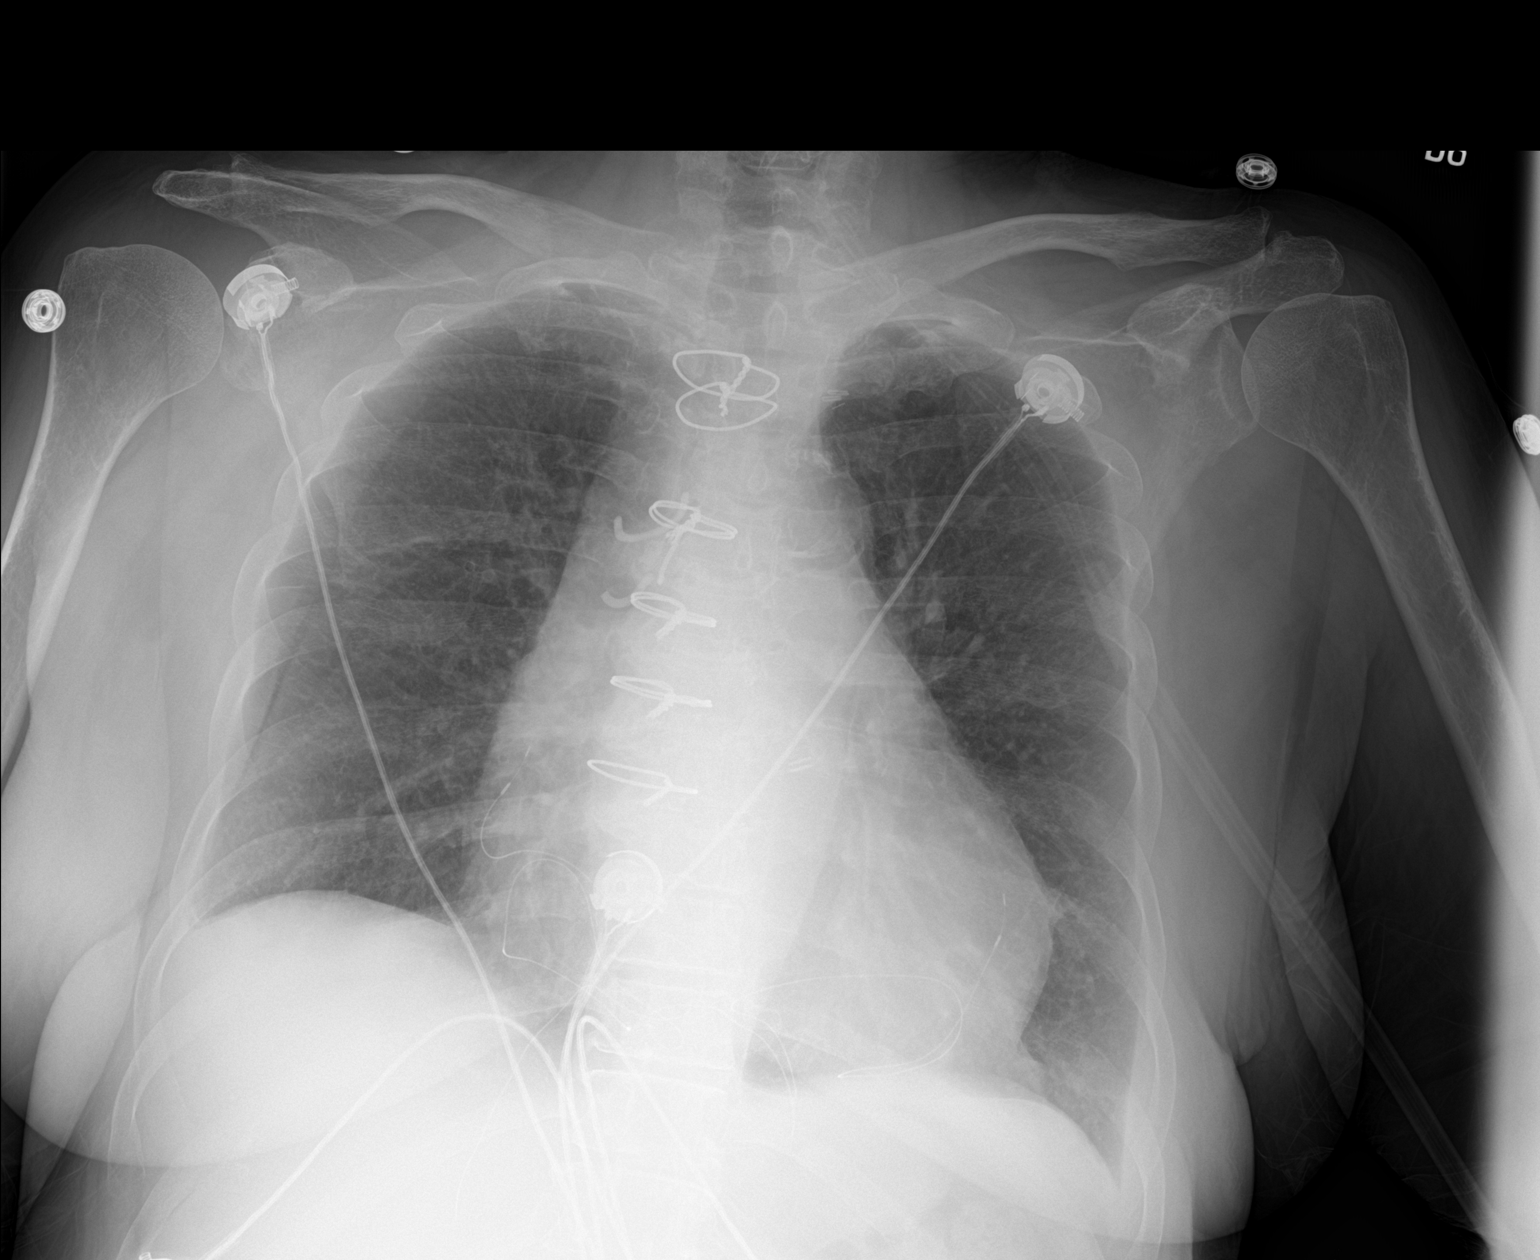

[1 of 1 positions shown; findings below may reference images not displayed]

FINDINGS: Stable cardiomegaly. Tracheostomy and feeding tubes have been
removed. Sternotomy wires are noted. No pneumothorax or pleural
effusion is noted. Lungs are clear. Bony thorax is unremarkable.
IMPRESSION: No active disease.

## 2020-07-08 ENCOUNTER — Other Ambulatory Visit (HOSPITAL_COMMUNITY): Payer: 59

## 2020-07-08 LAB — BLOOD CULTURE ID PANEL (REFLEXED) - BCID2

## 2020-07-08 LAB — ECHOCARDIOGRAM COMPLETE
Area-P 1/2: 3.17 cm2
P 1/2 time: 531 msec
S' Lateral: 4.1 cm

## 2020-07-08 LAB — VANCOMYCIN, TROUGH: Vancomycin Tr: 15 ug/mL (ref 15–20)

## 2020-07-08 LAB — URINE CULTURE: Culture: NO GROWTH

## 2020-07-08 LAB — POTASSIUM: Potassium: 3.8 mmol/L (ref 3.5–5.1)

## 2020-07-08 NOTE — Progress Notes (Signed)
  Echocardiogram 2D Echocardiogram has been performed.  Teresa [name] 07/08/2020, 3:42 PM

## 2020-07-09 LAB — POTASSIUM: Potassium: 4 mmol/L (ref 3.5–5.1)

## 2020-07-09 LAB — MAGNESIUM: Magnesium: 1.9 mg/dL (ref 1.7–2.4)

## 2020-07-09 NOTE — Consult Note (Signed)
Infectious Disease Consultation   Teresa [name]  QMV:[id number]  DOB: [birth year]  DOA: 06/01/2020  Requesting physician: Dr. Sharyon Medicus  Reason for consultation: Antibiotic recommendations   History of Present Illness: Teresa [name] is an 59 y.o. female with multiple medical problems including history of seizures, asthma, migraines, osteoarthritis, depression, hypertension, dyslipidemia who was admitted to the acute facility with non-ST elevation MI on 04/25/2020.  There was some concern that she could have had a seizure-like event with probable loss of consciousness.  EKG showed T wave inversions.  She was loaded with aspirin and started on heparin drip.  She was taken for left heart catheterization which showed severe multivessel coronary artery disease involving the proximal and mid LAD, CTO of the left circumflex and subtotal occlusion of the RCA.  Transthoracic echocardiogram showed EF 25-30% and akinesis with wall thinning of the RCA territory segments, severe hypokinesis in the left circumflex territory with moderate mitral regurgitation.  She underwent four-vessel CABG in September 2021.  Postoperatively she had transient renal insufficiency as well as atrial flutter for which she was started on amiodarone.  Eliquis was started however there was concern for GI bleed therefore Eliquis was discontinued.  Regarding her renal insufficiency nephrology was consulted and she was started on hemodialysis.  Patient remained on the ventilator postprocedure.  Due to concern for GI bleed she underwent EGD on 05/27/2020 which was negative for ulceration or bleed.  She was treated with Protonix.  Due to her complex medical problems she was transferred and admitted to [facility].  She underwent decannulation on 06/22/2020.  After admission here she had fever, UA was done which showed evidence for UTI, urine cultures from 06/02/2020 showed 30,000 colonies per mL of Acinetobacter, she also had  tracheal aspirate cultures from 06/02/2020 that showed stenotrophomonas, rare actinomyces for which she was treated with antibiotics.  She remained stable but recently started having high fever.  Blood cultures from 07/07/2020 showing staph aureus 2/2 sets, BC ID showing meth resistant mec A/C.  She is complaining of some shortness of breath.  She is on oxygen by nasal cannula.  She denies having any chest pain, nausea, vomiting, abdominal pain, back pain, diarrhea, dysuria or any other weakness or neurologic symptoms at this time.   Review of Systems:  Review of systems negative except as mentioned above in the HPI   Past Medical History: Past Medical History:  Diagnosis Date  . Acute on chronic respiratory failure with hypoxia (HCC)   . Acute renal failure due to tubular necrosis (HCC)   . Atrial flutter (HCC)   . Cardiogenic shock (HCC)   . Coronary artery disease involving native coronary artery of native heart   Hypertension, history of seizures, hyperlipidemia  Past Surgical History:  CORONARY ARTERY BYPASS GRAFT N/A 04/25/2020  Procedure: CABG ON PUMP - CORONARY ARTERY BYPASS GRAFT X 4; Surgeon: Tiajuana Amass, MD; Location: [facility] MAIN OR; Service: Cardiothoracic; Laterality: N/A;  . DEBRIDEMENT STERNAL N/A 04/30/2020  Procedure: IRRIGATION & DEBRIDEMENT CHEST WALL / STERNUM; Surgeon: Tiajuana Amass, MD; Location: [facility] MAIN OR; Service: Cardiothoracic; Laterality: N/A;  . IMPELLA INSERTION Left 04/25/2020  Procedure: IMPELLA INSERTION; Surgeon: Tiajuana Amass, MD; Location: [facility] MAIN OR; Service: Cardiothoracic; Laterality: Left;  . IMPELLA REMOVAL Left 05/07/2020  Procedure: IMPELLA REMOVAL; Surgeon: Tiajuana Amass, MD; Location: [facility] MAIN OR; Service: Cardiothoracic; Laterality: Left;  . TRACHEOSTOMY N/A 05/10/2020  Procedure: TRACHEOTOMY; Surgeon: Ephriam Jenkins  Justice Britain, MD;   Allergies: No known drug allergies  Social History: Patient has history of smoking, no history of  alcohol or recreational drug abuse.  Family History: Sister has diabetes, father and brother have coronary artery disease  Physical Exam: Vitals: Temperature 101.3, pulse 94, respiratory 23, blood pressure 120/63, pulse oximetry 94% Constitutional: Ill-appearing female, awake Head: Atraumatic, normocephalic Eyes: PERLA, EOMI  ENMT: external ears and nose appear normal, normal hearing, Lips appears normal, moist oral mucosa Neck: neck appears normal, no masses, normal ROM CVS: S1-S2, systolic murmur Respiratory: Decreased breath sound lower lobes, few wheezes, occasional rhonchi Abdomen: soft nontender, nondistended, normal bowel sounds Musculoskeletal: No edema Neuro: Cranial nerves II-XII intact, strength, sensation, reflexes Psych: stable mood and affect, mental status Skin: no rashes  Data reviewed:  I have personally reviewed following labs and imaging studies Labs:  CBC: Recent Labs  Lab 07/05/20 0002 07/07/20 1503  WBC 8.2 6.5  HGB 8.9* 8.1*  HCT 28.4* 26.1*  MCV 91.9 92.2  PLT 345 297    Basic Metabolic Panel: Recent Labs  Lab 07/05/20 0002 07/05/20 0002 07/06/20 0542 07/06/20 0542 07/07/20 1503 07/07/20 1503 07/08/20 0959 07/09/20 0412  NA 137  --   --   --  141  --   --   --   K 3.4*   < > 4.0   < > 3.3*   < > 3.8 4.0  CL 99  --   --   --  110  --   --   --   CO2 29  --   --   --  25  --   --   --   GLUCOSE 112*  --   --   --  86  --   --   --   BUN 15  --   --   --  11  --   --   --   CREATININE 0.65  --   --   --  0.66  --   --   --   CALCIUM 8.9  --   --   --  6.9*  --   --   --   MG 1.5*  --  2.0  --  1.2*  --   --  1.9  PHOS 3.4  --   --   --   --   --   --   --    < > = values in this interval not displayed.   GFR CrCl cannot be calculated (Unknown ideal weight.). Liver Function Tests: No results for input(s): AST, ALT, ALKPHOS, BILITOT, PROT, ALBUMIN in the last 168 hours. No results for input(s): LIPASE, AMYLASE in the last 168 hours. No  results for input(s): AMMONIA in the last 168 hours. Coagulation profile No results for input(s): INR, PROTIME in the last 168 hours.  Cardiac Enzymes: No results for input(s): CKTOTAL, CKMB, CKMBINDEX, TROPONINI in the last 168 hours. BNP: Invalid input(s): POCBNP CBG: No results for input(s): GLUCAP in the last 168 hours. D-Dimer No results for input(s): DDIMER in the last 72 hours. Hgb A1c No results for input(s): HGBA1C in the last 72 hours. Lipid Profile No results for input(s): CHOL, HDL, LDLCALC, TRIG, CHOLHDL, LDLDIRECT in the last 72 hours. Thyroid function studies No results for input(s): TSH, T4TOTAL, T3FREE, THYROIDAB in the last 72 hours.  Invalid input(s): FREET3 Anemia work up No results for input(s): VITAMINB12, FOLATE, FERRITIN, TIBC, IRON, RETICCTPCT in the last 72 hours. Urinalysis  Component Value Date/Time   COLORURINE YELLOW 07/07/2020 1130   APPEARANCEUR HAZY (A) 07/07/2020 1130   LABSPEC 1.016 07/07/2020 1130   PHURINE 7.0 07/07/2020 1130   GLUCOSEU NEGATIVE 07/07/2020 1130   HGBUR NEGATIVE 07/07/2020 1130   BILIRUBINUR NEGATIVE 07/07/2020 1130   KETONESUR NEGATIVE 07/07/2020 1130   PROTEINUR 100 (A) 07/07/2020 1130   NITRITE NEGATIVE 07/07/2020 1130   LEUKOCYTESUR NEGATIVE 07/07/2020 1130     Microbiology Recent Results (from the past 240 hour(s))  Expectorated sputum assessment w rflx to resp cult     Status: None   Collection Time: 07/01/20 10:28 AM   Specimen: Expectorated Sputum  Result Value Ref Range Status   Specimen Description EXPECTORATED SPUTUM  Final   Special Requests NONE  Final   Sputum evaluation   Final    THIS SPECIMEN IS ACCEPTABLE FOR SPUTUM CULTURE Performed at [facility], [address], [place], [place] [address]    Report Status 07/01/2020 FINAL  Final  Culture, respiratory     Status: None   Collection Time: 07/01/20 10:28 AM  Result Value Ref Range Status   Specimen Description EXPECTORATED SPUTUM   Final   Special Requests NONE Reflexed from [id number]  Final   Gram Stain   Final    MODERATE WBC PRESENT,BOTH PMN AND MONONUCLEAR MODERATE GRAM POSITIVE COCCI FEW GRAM NEGATIVE RODS MODERATE GRAM VARIABLE ROD    Culture   Final    Normal respiratory flora-no Staph aureus or Pseudomonas seen Performed at [facility], [address] [place], [place] [address]    Report Status 07/03/2020 FINAL  Final  Culture, Urine     Status: Abnormal   Collection Time: 07/01/20 10:40 PM   Specimen: Urine, Random  Result Value Ref Range Status   Specimen Description URINE, RANDOM  Final   Special Requests   Final    NONE Performed at [facility], [address], [place], [place] [address]    Culture MULTIPLE SPECIES PRESENT, SUGGEST RECOLLECTION (A)  Final   Report Status 07/03/2020 FINAL  Final  Culture, blood (Routine X 2) w Reflex to ID Panel     Status: Abnormal (Preliminary result)   Collection Time: 07/07/20 10:11 AM   Specimen: BLOOD  Result Value Ref Range Status   Specimen Description BLOOD RIGHT ANTECUBITAL  Final   Special Requests   Final    BOTTLES DRAWN AEROBIC AND ANAEROBIC Blood Culture adequate volume   Culture  Setup Time   Final    GRAM POSITIVE COCCI CRITICAL RESULT CALLED TO, READ BACK BY AND VERIFIED WITH: L LANLANSA RN 07/08/20 0619 JDW IN BOTH AEROBIC AND ANAEROBIC BOTTLES    Culture (A)  Final    STAPHYLOCOCCUS AUREUS SUSCEPTIBILITIES TO FOLLOW Performed at [facility], [address], [place], [place] [address]    Report Status PENDING  Incomplete  Blood Culture ID Panel (Reflexed)     Status: Abnormal   Collection Time: 07/07/20 10:11 AM  Result Value Ref Range Status   Enterococcus faecalis NOT DETECTED NOT DETECTED Final   Enterococcus Faecium NOT DETECTED NOT DETECTED Final   Listeria monocytogenes NOT DETECTED NOT DETECTED Final   Staphylococcus species DETECTED (A) NOT DETECTED Final    Comment: CRITICAL RESULT CALLED TO, READ BACK BY  AND VERIFIED WITH: Jesusita Oka RN 07/08/20 0347 JDW    Staphylococcus aureus (BCID) DETECTED (A) NOT DETECTED Final    Comment: Methicillin (oxacillin)-resistant Staphylococcus aureus (MRSA). MRSA is predictably  resistant to beta-lactam antibiotics (except ceftaroline). Preferred therapy is vancomycin unless clinically contraindicated. Patient requires contact precautions if  hospitalized. CRITICAL RESULT CALLED TO, READ BACK BY AND VERIFIED WITH: L LANLANSA RN 07/08/20 5638 JDW    Staphylococcus epidermidis NOT DETECTED NOT DETECTED Final   Staphylococcus lugdunensis NOT DETECTED NOT DETECTED Final   Streptococcus species NOT DETECTED NOT DETECTED Final   Streptococcus agalactiae NOT DETECTED NOT DETECTED Final   Streptococcus pneumoniae NOT DETECTED NOT DETECTED Final   Streptococcus pyogenes NOT DETECTED NOT DETECTED Final   A.calcoaceticus-baumannii NOT DETECTED NOT DETECTED Final   Bacteroides fragilis NOT DETECTED NOT DETECTED Final   Enterobacterales NOT DETECTED NOT DETECTED Final   Enterobacter cloacae complex NOT DETECTED NOT DETECTED Final   Escherichia coli NOT DETECTED NOT DETECTED Final   Klebsiella aerogenes NOT DETECTED NOT DETECTED Final   Klebsiella oxytoca NOT DETECTED NOT DETECTED Final   Klebsiella pneumoniae NOT DETECTED NOT DETECTED Final   Proteus species NOT DETECTED NOT DETECTED Final   Salmonella species NOT DETECTED NOT DETECTED Final   Serratia marcescens NOT DETECTED NOT DETECTED Final   Haemophilus influenzae NOT DETECTED NOT DETECTED Final   Neisseria meningitidis NOT DETECTED NOT DETECTED Final   Pseudomonas aeruginosa NOT DETECTED NOT DETECTED Final   Stenotrophomonas maltophilia NOT DETECTED NOT DETECTED Final   Candida albicans NOT DETECTED NOT DETECTED Final   Candida auris NOT DETECTED NOT DETECTED Final   Candida glabrata NOT DETECTED NOT DETECTED Final   Candida krusei NOT DETECTED NOT DETECTED Final   Candida parapsilosis NOT DETECTED NOT  DETECTED Final   Candida tropicalis NOT DETECTED NOT DETECTED Final   Cryptococcus neoformans/gattii NOT DETECTED NOT DETECTED Final   Meth resistant mecA/C and MREJ DETECTED (A) NOT DETECTED Final    Comment: CRITICAL RESULT CALLED TO, READ BACK BY AND VERIFIED WITHJesusita Oka RN 07/08/20 9373 JDW Performed at [facility], [address], [place], [place] [address]   Culture, blood (Routine X 2) w Reflex to ID Panel     Status: Abnormal (Preliminary result)   Collection Time: 07/07/20 10:23 AM   Specimen: BLOOD  Result Value Ref Range Status   Specimen Description BLOOD LEFT ANTECUBITAL  Final   Special Requests   Final    BOTTLES DRAWN AEROBIC AND ANAEROBIC Blood Culture adequate volume   Culture  Setup Time   Final    ANAEROBIC BOTTLE ONLY GRAM POSITIVE COCCI CRITICAL VALUE NOTED.  VALUE IS CONSISTENT WITH PREVIOUSLY REPORTED AND CALLED VALUE. Performed at [facility], [address] [place], [place] [address]    Culture STAPHYLOCOCCUS AUREUS (A)  Final   Report Status PENDING  Incomplete  Culture, Urine     Status: None   Collection Time: 07/07/20 11:30 AM   Specimen: Urine, Random  Result Value Ref Range Status   Specimen Description URINE, RANDOM  Final   Special Requests NONE  Final   Culture   Final    NO GROWTH Performed at [facility], [address] [place], [place] [address]    Report Status 07/08/2020 FINAL  Final    Inpatient Medications:   Please see MAR   Radiological Exams on Admission: ECHOCARDIOGRAM COMPLETE  Result Date: 07/08/2020    ECHOCARDIOGRAM REPORT   Patient Name:   Teresa [name] Date of Exam: 07/08/2020 Medical Rec #:  [id number]    Height: Accession #:    [id number]   Weight: Date of Birth:  [birth year]   BSA:  Patient Age:    59 years     BP:           130/70 mmHg Patient Gender: F            HR:           93 bpm. Exam Location:  Inpatient Procedure: 2D Echo, Cardiac Doppler and Color Doppler Indications:     Bacteremia   History:         Patient has no prior history of Echocardiogram examinations.                  CHF, CAD, Prior CABG, Signs/Symptoms:Bacteremia; Risk                  Factors:Hypertension and Current Smoker.  Sonographer:     Lavenia Atlas RDCS Referring Phys:  [id number] PRIYA VARGHESE Diagnosing Phys: Orpah Cobb MD IMPRESSIONS  1. Left ventricular ejection fraction, by estimation, is 45 to 50%. The left ventricle has mildly decreased function. The left ventricle demonstrates regional wall motion abnormalities (see scoring diagram/findings for description). The left ventricular  internal cavity size was mildly dilated. Left ventricular diastolic parameters are consistent with Grade II diastolic dysfunction (pseudonormalization).  2. Right ventricular systolic function is normal. The right ventricular size is normal. There is normal pulmonary artery systolic pressure.  3. Left atrial size was moderately dilated.  4. Right atrial size was mildly dilated.  5. The pericardial effusion is circumferential. There is no evidence of cardiac tamponade.  6. The mitral valve is normal in structure. Mild mitral valve regurgitation. Moderate mitral annular calcification.  7. The aortic valve is tricuspid. Aortic valve regurgitation is mild. Mild aortic valve sclerosis is present, with no evidence of aortic valve stenosis.  8. The inferior vena cava is normal in size with <50% respiratory variability, suggesting right atrial pressure of 8 mmHg. FINDINGS  Left Ventricle: Left ventricular ejection fraction, by estimation, is 45 to 50%. The left ventricle has mildly decreased function. The left ventricle demonstrates regional wall motion abnormalities. The left ventricular internal cavity size was mildly dilated. There is borderline concentric left ventricular hypertrophy. Left ventricular diastolic parameters are consistent with Grade II diastolic dysfunction (pseudonormalization). Right Ventricle: The right ventricular size is  normal. No increase in right ventricular wall thickness. Right ventricular systolic function is normal. There is normal pulmonary artery systolic pressure. The tricuspid regurgitant velocity is 2.44 m/s, and  with an assumed right atrial pressure of 3 mmHg, the estimated right ventricular systolic pressure is 26.8 mmHg. Left Atrium: Left atrial size was moderately dilated. Right Atrium: Right atrial size was mildly dilated. Pericardium: Trivial pericardial effusion is present. The pericardial effusion is circumferential. There is no evidence of cardiac tamponade. Mitral Valve: The mitral valve is normal in structure. Moderate mitral annular calcification. Mild mitral valve regurgitation. Tricuspid Valve: The tricuspid valve is normal in structure. Tricuspid valve regurgitation is mild. Aortic Valve: The aortic valve is tricuspid. Aortic valve regurgitation is mild. Aortic regurgitation PHT measures 531 msec. Mild aortic valve sclerosis is present, with no evidence of aortic valve stenosis. Pulmonic Valve: The pulmonic valve was normal in structure. Pulmonic valve regurgitation is trivial. Aorta: The aortic root is normal in size and structure. There is minimal (Grade I) atheroma plaque involving the aortic root. Venous: The inferior vena cava is normal in size with less than 50% respiratory variability, suggesting right atrial pressure of 8 mmHg. IAS/Shunts: The interatrial septum was not assessed.  LEFT VENTRICLE PLAX 2D LVIDd:  5.60 cm  Diastology LVIDs:         4.10 cm  LV e' medial:    4.13 cm/s LV PW:         1.10 cm  LV E/e' medial:  22.8 LV IVS:        1.10 cm  LV e' lateral:   5.33 cm/s LVOT diam:     2.30 cm  LV E/e' lateral: 17.7 LV SV:         83 LVOT Area:     4.15 cm  RIGHT VENTRICLE RV Basal diam:  2.90 cm RV S prime:     5.00 cm/s TAPSE (M-mode): 1.3 cm LEFT ATRIUM             RIGHT ATRIUM LA diam:        3.70 cm RA Area:     14.50 cm LA Vol (A2C):   54.5 ml RA Volume:   36.60 ml LA Vol  (A4C):   58.7 ml LA Biplane Vol: 58.9 ml  AORTIC VALVE LVOT Vmax:   95.30 cm/s LVOT Vmean:  63.500 cm/s LVOT VTI:    0.199 m AI PHT:      531 msec  AORTA Ao Root diam: 2.90 cm MITRAL VALVE               TRICUSPID VALVE MV Area (PHT): 3.17 cm    TR Peak grad:   23.8 mmHg MV Decel Time: 239 msec    TR Vmax:        244.00 cm/s MV E velocity: 94.30 cm/s MV A velocity: 58.70 cm/s  SHUNTS MV E/A ratio:  1.61        Systemic VTI:  0.20 m                            Systemic Diam: 2.30 cm Orpah CobbAjay Kadakia MD Electronically signed by Orpah CobbAjay Kadakia MD Signature Date/Time: 07/08/2020/5:31:39 PM    Final     Impression/Recommendations Active Problems: Acute on chronic respiratory failure with hypoxia  Fever MRSA bacteremia Systemic inflammatory response syndrome Pneumonia Urinary tract infection Systolic congestive heart failure Diabetes mellitus Atrial fibrillation Cardiogenic shock, resolved Coronary artery disease involving native coronary artery of native heart Acute renal failure due to tubular necrosis, improving Debility  Acute on chronic respiratory failure with hypoxemia: She is status post decannulation.  She still having some shortness of breath on oxygen by nasal cannula.  Suspect she may have underlying COPD given the prolonged history of smoking.  She also has systolic congestive heart failure with EF 25-30% based on the previous echocardiogram.  Previously had pneumonia for which she received treatment with antibiotics.  Continue management per the primary team.  Fever/systemic inflammatory response syndrome: Likely secondary to the MRSA bacteremia.  Both sets of blood cultures showing staph aureus with a BC ID highly suspicious for MRSA bacteremia.  She is currently on treatment with IV vancomycin, cefepime.  Do not think she needs cefepime.  Continue treatment with IV vancomycin.  Exact source for the MRSA bacteremia is unclear at this time.  She denies having any back pain or weakness at this  time.  Suggest 2D echocardiogram to evaluate for endocarditis.  She does not have any lines at this time.  If she is continuing to have fevers and echo negative we may need to consider CT imaging of the chest/abdomen/pelvis to evaluate for sources.  In the absence of a clear source we  will plan to treat at least for duration of 3-4 weeks.  She also has recent renal failure.  Therefore please monitor BUN/creatinine closely while on antibiotics.  Avoid nephrotoxic medications.  Pneumonia/urinary tract infection: She already received treatment with antibiotics for this.  She still has some shortness of breath, denies having any major cough or sputum production or dysuria.  On antibiotics as mentioned above for the bacteremia.  Systolic congestive heart failure: Patient had EF 25-30% on the previous echocardiogram done at the acute facility.  She also has mitral regurgitation.  Suggest repeat echocardiogram in the setting of bacteremia to evaluate for probable endocarditis.  Diabetes mellitus: Continue to monitor Accu-Cheks, medications and management of diabetes per the primary team.  Atrial fibrillation: Continue medications per the primary team.  She is on amiodarone, metoprolol.  Cardiogenic shock: Currently resolved.  This was secondary to non-ST elevation MI.  She is status post CABG.  She is high risk for shock given the MRSA bacteremia.  Continue to monitor closely.  Further management per the primary team.  Acute kidney injury: This was in the setting of cardiogenic shock.  She needed dialysis at the acute facility.  Currently improving.  Please monitor BUN/trending closely while on antibiotics.  Avoid nephrotoxic medication.  Further management per the primary team.  Debility: Continue supportive management per the primary team.  If she starts having any new onset of weakness, or pain we may need to consider imaging the spine to evaluate for probable epidural abscess.  Thank you for this  consultation.  Plan of care discussed with the patient, primary team and pharmacy.    Vonzella Nipple M.D. 07/09/2020, 4:02 PM

## 2020-07-10 LAB — CULTURE, BLOOD (ROUTINE X 2)
Special Requests: ADEQUATE
Special Requests: ADEQUATE

## 2020-07-11 LAB — CBC
HCT: 26.2 % — ABNORMAL LOW (ref 36.0–46.0)
Hemoglobin: 8.3 g/dL — ABNORMAL LOW (ref 12.0–15.0)
MCH: 28.6 pg (ref 26.0–34.0)
MCHC: 31.7 g/dL (ref 30.0–36.0)
MCV: 90.3 fL (ref 80.0–100.0)
Platelets: 343 10*3/uL (ref 150–400)
RBC: 2.9 MIL/uL — ABNORMAL LOW (ref 3.87–5.11)
RDW: 15.4 % (ref 11.5–15.5)
WBC: 6.5 10*3/uL (ref 4.0–10.5)
nRBC: 0 % (ref 0.0–0.2)

## 2020-07-11 LAB — BASIC METABOLIC PANEL
Anion gap: 8 (ref 5–15)
BUN: 13 mg/dL (ref 6–20)
CO2: 29 mmol/L (ref 22–32)
Calcium: 8.6 mg/dL — ABNORMAL LOW (ref 8.9–10.3)
Chloride: 102 mmol/L (ref 98–111)
Creatinine, Ser: 0.65 mg/dL (ref 0.44–1.00)
GFR, Estimated: >60 mL/min (ref >60)
Glucose, Bld: 90 mg/dL (ref 70–99)
Potassium: 3.4 mmol/L — ABNORMAL LOW (ref 3.5–5.1)
Sodium: 139 mmol/L (ref 135–145)

## 2020-07-11 LAB — MAGNESIUM: Magnesium: 1.6 mg/dL — ABNORMAL LOW (ref 1.7–2.4)

## 2020-07-12 LAB — POTASSIUM: Potassium: 4 mmol/L (ref 3.5–5.1)

## 2020-07-12 LAB — MAGNESIUM: Magnesium: 1.9 mg/dL (ref 1.7–2.4)

## 2020-07-13 LAB — VANCOMYCIN, TROUGH: Vancomycin Tr: 27 ug/mL (ref 15–20)

## 2020-07-14 LAB — CULTURE, BLOOD (ROUTINE X 2)
Culture: NO GROWTH
Culture: NO GROWTH

## 2020-07-14 LAB — VANCOMYCIN, TROUGH: Vancomycin Tr: 14 ug/mL — ABNORMAL LOW (ref 15–20)

## 2020-07-15 LAB — BASIC METABOLIC PANEL WITH GFR
Anion gap: 10 (ref 5–15)
BUN: 11 mg/dL (ref 6–20)
CO2: 26 mmol/L (ref 22–32)
Calcium: 9.1 mg/dL (ref 8.9–10.3)
Chloride: 102 mmol/L (ref 98–111)
Creatinine, Ser: 0.86 mg/dL (ref 0.44–1.00)
GFR, Estimated: >60 mL/min
Glucose, Bld: 94 mg/dL (ref 70–99)
Potassium: 3.7 mmol/L (ref 3.5–5.1)
Sodium: 138 mmol/L (ref 135–145)

## 2020-07-15 LAB — PHOSPHORUS: Phosphorus: 3.7 mg/dL (ref 2.5–4.6)

## 2020-07-15 LAB — CBC
HCT: 26.6 % — ABNORMAL LOW (ref 36.0–46.0)
Hemoglobin: 8.4 g/dL — ABNORMAL LOW (ref 12.0–15.0)
MCH: 28.6 pg (ref 26.0–34.0)
MCHC: 31.6 g/dL (ref 30.0–36.0)
MCV: 90.5 fL (ref 80.0–100.0)
Platelets: 412 K/uL — ABNORMAL HIGH (ref 150–400)
RBC: 2.94 MIL/uL — ABNORMAL LOW (ref 3.87–5.11)
RDW: 15.4 % (ref 11.5–15.5)
WBC: 10.2 K/uL (ref 4.0–10.5)
nRBC: 0 % (ref 0.0–0.2)

## 2020-07-15 LAB — MAGNESIUM: Magnesium: 1.7 mg/dL (ref 1.7–2.4)

## 2020-07-15 NOTE — Progress Notes (Signed)
PROGRESS NOTE    Teresa [name]  HAL:[id number] DOB: 06-15-1961 DOA: 06/01/2020   Brief Narrative:  Teresa [name] is an 59 y.o. female with multiple medical problems including history of seizures, asthma, migraines, osteoarthritis, depression, hypertension, dyslipidemia who was admitted to the acute facility with non-ST elevation MI on 04/25/2020.  There was some concern that she could have had a seizure-like event with probable loss of consciousness.  EKG showed T wave inversions.  She was loaded with aspirin and started on heparin drip.  She was taken for left heart catheterization which showed severe multivessel coronary artery disease involving the proximal and mid LAD, CTO of the left circumflex and subtotal occlusion of the RCA.  Transthoracic echocardiogram showed EF 25-30% and akinesis with wall thinning of the RCA territory segments, severe hypokinesis in the left circumflex territory with moderate mitral regurgitation.  She underwent four-vessel CABG in September 2021.  Postoperatively she had transient renal insufficiency as well as atrial flutter for which she was started on amiodarone.  Eliquis was started however there was concern for GI bleed therefore Eliquis was discontinued.  Regarding her renal insufficiency nephrology was consulted and she was started on hemodialysis.  Patient remained on the ventilator postprocedure.  Due to concern for GI bleed she underwent EGD on 05/27/2020 which was negative for ulceration or bleed.  She was treated with Protonix.  Due to her complex medical problems she was transferred and admitted to [facility].  She underwent decannulation on 06/22/2020.  After admission here she had fever, UA was done which showed evidence for UTI, urine cultures from 06/02/2020 showed 30,000 colonies per mL of Acinetobacter, she also had tracheal aspirate cultures from 06/02/2020 that showed stenotrophomonas, rare actinomyces for which she was treated with antibiotics.   She remained stable for a while but started having high fever.  Blood cultures from 07/07/2020 showed MRSA. She is on treatment with IV vancomycin.   Assessment & Plan:  Active Problems: Acute on chronic respiratory failure with hypoxia  Fever, resolved MRSA bacteremia Systemic inflammatory response syndrome, improved Pneumonia Urinary tract infection Systolic congestive heart failure Diabetes mellitus Atrial fibrillation Cardiogenic shock, resolved Coronary artery disease involving native coronary artery of native heart Acute renal failure due to tubular necrosis, improved Debility  Acute on chronic respiratory failure with hypoxemia: She is status post decannulation. Suspect she may have underlying COPD given the prolonged history of smoking.  She also has systolic congestive heart failure with EF 25-30% based on the previous echocardiogram.  Previously had pneumonia for which she received treatment with antibiotics.  Respiratory status appears to be stable at this time. Continue management per the primary team.  Fever/systemic inflammatory response syndrome: Improved.  Likely secondary to the MRSA bacteremia.  Currently on treatment with IV vancomycin.  Repeat blood cultures from 07/09/2020 did not show any growth. Exact source for the MRSA bacteremia is unclear at this time.  She denies having any back pain or weakness at this time. 2D echocardiogram did not show any evidence for valve vegetations or endocarditis. In the absence of a clear source we will plan to treat at least for duration of 3-4 weeks.  Tentative end date for the antibiotics is 08/05/2020. She also has recent renal failure. Therefore please monitor BUN/creatinine closely while on antibiotics.  Avoid nephrotoxic medications.  Pneumonia/urinary tract infection: She already received treatment with antibiotics for this. On antibiotics as mentioned above for the bacteremia.  Systolic congestive heart failure: Patient had  EF 25-30%  on the previous echocardiogram done at the acute facility.  She also has mitral regurgitation.  Repeat echocardiogram showed that the EF is improved.  No evidence of any valve vegetations.  Diabetes mellitus: Continue to monitor Accu-Cheks, medications and management of diabetes per the primary team.  Atrial fibrillation: Continue medications per the primary team.  She is on amiodarone, metoprolol.  Cardiogenic shock: Currently resolved.  This was secondary to non-ST elevation MI.  She is status post CABG.  Continue to monitor closely.  Further management per the primary team.  Acute kidney injury: This was in the setting of cardiogenic shock.  She needed dialysis at the acute facility.  Currently improved.  Please monitor BUN/cr closely while on antibiotics.  Avoid nephrotoxic medication.  Further management per the primary team.  Debility: Continue supportive management per the primary team.  If she starts having any new onset of weakness, or pain we may need to consider imaging the spine to evaluate for probable epidural abscess.  Plan of care discussed with the patient, primary team and pharmacy.  Subjective: No acute events reported.  She denies having any major complaints at this time.  Objective: Vitals: Temperature 98, pulse 78, respiratory rate 18, blood pressure 122/71, pulse oximetry 96%.  Examination: Constitutional: awake, not in any acute distress at this time. Head: Atraumatic, normocephalic Eyes: PERLA, EOMI  ENMT: external ears and nose appear normal, normal hearing, Lips appears normal, moist oral mucosa  Neck: neck appears normal, no masses, normal ROM CVS: S1-S2, systolic murmur Respiratory: Decreased breath sound lower lobes, few wheezes, occasional rhonchi Abdomen: soft nontender, nondistended, normal bowel sounds Musculoskeletal: No edema Neuro: Cranial nerves II-XII intact, strength, sensation, reflexes Psych: stable mood and affect, mental  status Skin: no rashes    Data Reviewed: I have personally reviewed following labs and imaging studies  CBC: Recent Labs  Lab 07/11/20 0400 07/15/20 0421  WBC 6.5 10.2  HGB 8.3* 8.4*  HCT 26.2* 26.6*  MCV 90.3 90.5  PLT 343 412*    Basic Metabolic Panel: Recent Labs  Lab 07/09/20 0412 07/11/20 0400 07/12/20 0652 07/15/20 0421  NA  --  139  --  138  K 4.0 3.4* 4.0 3.7  CL  --  102  --  102  CO2  --  29  --  26  GLUCOSE  --  90  --  94  BUN  --  13  --  11  CREATININE  --  0.65  --  0.86  CALCIUM  --  8.6*  --  9.1  MG 1.9 1.6* 1.9 1.7  PHOS  --   --   --  3.7    GFR: CrCl cannot be calculated (Unknown ideal weight.).  Liver Function Tests: No results for input(s): AST, ALT, ALKPHOS, BILITOT, PROT, ALBUMIN in the last 168 hours.  CBG: No results for input(s): GLUCAP in the last 168 hours.   Recent Results (from the past 240 hour(s))  Culture, blood (Routine X 2) w Reflex to ID Panel     Status: Abnormal   Collection Time: 07/07/20 10:11 AM   Specimen: BLOOD  Result Value Ref Range Status   Specimen Description BLOOD RIGHT ANTECUBITAL  Final   Special Requests   Final    BOTTLES DRAWN AEROBIC AND ANAEROBIC Blood Culture adequate volume   Culture  Setup Time   Final    GRAM POSITIVE COCCI CRITICAL RESULT CALLED TO, READ BACK BY AND VERIFIED WITH: Jesusita OkaL LANLANSA RN 07/08/20 0619 JDW  IN BOTH AEROBIC AND ANAEROBIC BOTTLES Performed at [facility], [address], [place], [place] [address]    Culture METHICILLIN RESISTANT STAPHYLOCOCCUS AUREUS (A)  Final   Report Status 07/10/2020 FINAL  Final   Organism ID, Bacteria METHICILLIN RESISTANT STAPHYLOCOCCUS AUREUS  Final      Susceptibility   Methicillin resistant staphylococcus aureus - MIC*    CIPROFLOXACIN >=8 RESISTANT Resistant     ERYTHROMYCIN >=8 RESISTANT Resistant     GENTAMICIN <=0.5 SENSITIVE Sensitive     OXACILLIN >=4 RESISTANT Resistant     TETRACYCLINE 2 SENSITIVE Sensitive     VANCOMYCIN  <=0.5 SENSITIVE Sensitive     TRIMETH/SULFA <=10 SENSITIVE Sensitive     CLINDAMYCIN >=8 RESISTANT Resistant     RIFAMPIN <=0.5 SENSITIVE Sensitive     Inducible Clindamycin NEGATIVE Sensitive     * METHICILLIN RESISTANT STAPHYLOCOCCUS AUREUS  Blood Culture ID Panel (Reflexed)     Status: Abnormal   Collection Time: 07/07/20 10:11 AM  Result Value Ref Range Status   Enterococcus faecalis NOT DETECTED NOT DETECTED Final   Enterococcus Faecium NOT DETECTED NOT DETECTED Final   Listeria monocytogenes NOT DETECTED NOT DETECTED Final   Staphylococcus species DETECTED (A) NOT DETECTED Final    Comment: CRITICAL RESULT CALLED TO, READ BACK BY AND VERIFIED WITH: Jesusita Oka RN 07/08/20 9702 JDW    Staphylococcus aureus (BCID) DETECTED (A) NOT DETECTED Final    Comment: Methicillin (oxacillin)-resistant Staphylococcus aureus (MRSA). MRSA is predictably resistant to beta-lactam antibiotics (except ceftaroline). Preferred therapy is vancomycin unless clinically contraindicated. Patient requires contact precautions if  hospitalized. CRITICAL RESULT CALLED TO, READ BACK BY AND VERIFIED WITH: L LANLANSA RN 07/08/20 [id number] JDW    Staphylococcus epidermidis NOT DETECTED NOT DETECTED Final   Staphylococcus lugdunensis NOT DETECTED NOT DETECTED Final   Streptococcus species NOT DETECTED NOT DETECTED Final   Streptococcus agalactiae NOT DETECTED NOT DETECTED Final   Streptococcus pneumoniae NOT DETECTED NOT DETECTED Final   Streptococcus pyogenes NOT DETECTED NOT DETECTED Final   A.calcoaceticus-baumannii NOT DETECTED NOT DETECTED Final   Bacteroides fragilis NOT DETECTED NOT DETECTED Final   Enterobacterales NOT DETECTED NOT DETECTED Final   Enterobacter cloacae complex NOT DETECTED NOT DETECTED Final   Escherichia coli NOT DETECTED NOT DETECTED Final   Klebsiella aerogenes NOT DETECTED NOT DETECTED Final   Klebsiella oxytoca NOT DETECTED NOT DETECTED Final   Klebsiella pneumoniae NOT DETECTED NOT  DETECTED Final   Proteus species NOT DETECTED NOT DETECTED Final   Salmonella species NOT DETECTED NOT DETECTED Final   Serratia marcescens NOT DETECTED NOT DETECTED Final   Haemophilus influenzae NOT DETECTED NOT DETECTED Final   Neisseria meningitidis NOT DETECTED NOT DETECTED Final   Pseudomonas aeruginosa NOT DETECTED NOT DETECTED Final   Stenotrophomonas maltophilia NOT DETECTED NOT DETECTED Final   Candida albicans NOT DETECTED NOT DETECTED Final   Candida auris NOT DETECTED NOT DETECTED Final   Candida glabrata NOT DETECTED NOT DETECTED Final   Candida krusei NOT DETECTED NOT DETECTED Final   Candida parapsilosis NOT DETECTED NOT DETECTED Final   Candida tropicalis NOT DETECTED NOT DETECTED Final   Cryptococcus neoformans/gattii NOT DETECTED NOT DETECTED Final   Meth resistant mecA/C and MREJ DETECTED (A) NOT DETECTED Final    Comment: CRITICAL RESULT CALLED TO, READ BACK BY AND VERIFIED WITHJesusita Oka RN 07/08/20 [id number] JDW Performed at [facility], [address] [place], [place] [address]   Culture, blood (Routine X 2) w Reflex to  ID Panel     Status: Abnormal   Collection Time: 07/07/20 10:23 AM   Specimen: BLOOD  Result Value Ref Range Status   Specimen Description BLOOD LEFT ANTECUBITAL  Final   Special Requests   Final    BOTTLES DRAWN AEROBIC AND ANAEROBIC Blood Culture adequate volume   Culture  Setup Time   Final    ANAEROBIC BOTTLE ONLY GRAM POSITIVE COCCI CRITICAL VALUE NOTED.  VALUE IS CONSISTENT WITH PREVIOUSLY REPORTED AND CALLED VALUE.    Culture (A)  Final    STAPHYLOCOCCUS AUREUS SUSCEPTIBILITIES PERFORMED ON PREVIOUS CULTURE WITHIN THE LAST 5 DAYS. Performed at [facility], [address], [place], [place] [address]    Report Status 07/10/2020 FINAL  Final  Culture, Urine     Status: None   Collection Time: 07/07/20 11:30 AM   Specimen: Urine, Random  Result Value Ref Range Status   Specimen Description URINE, RANDOM  Final   Special  Requests NONE  Final   Culture   Final    NO GROWTH Performed at [facility], [address] [place], [place] [address]    Report Status 07/08/2020 FINAL  Final  Culture, blood (routine x 2)     Status: None   Collection Time: 07/09/20 12:30 PM   Specimen: BLOOD  Result Value Ref Range Status   Specimen Description BLOOD RIGHT ANTECUBITAL  Final   Special Requests   Final    BOTTLES DRAWN AEROBIC AND ANAEROBIC Blood Culture results may not be optimal due to an excessive volume of blood received in culture bottles   Culture   Final    NO GROWTH 5 DAYS Performed at [facility], [address], [place], [place] [address]    Report Status 07/14/2020 FINAL  Final  Culture, blood (routine x 2)     Status: None   Collection Time: 07/09/20 12:45 PM   Specimen: BLOOD RIGHT HAND  Result Value Ref Range Status   Specimen Description BLOOD RIGHT HAND  Final   Special Requests   Final    BOTTLES DRAWN AEROBIC AND ANAEROBIC Blood Culture results may not be optimal due to an excessive volume of blood received in culture bottles   Culture   Final    NO GROWTH 5 DAYS Performed at [facility], [address], [place], [place] [address]    Report Status 07/14/2020 FINAL  Final      Radiology Studies: No results found.   Scheduled Meds: Please see MAR    Vonzella Nipple, MD   07/15/2020, 5:04 PM

## 2020-07-16 LAB — MAGNESIUM: Magnesium: 1.9 mg/dL (ref 1.7–2.4)

## 2020-08-11 ENCOUNTER — Other Ambulatory Visit: Payer: Self-pay

## 2020-08-13 ENCOUNTER — Ambulatory Visit (INDEPENDENT_AMBULATORY_CARE_PROVIDER_SITE_OTHER): Payer: 59

## 2020-08-13 ENCOUNTER — Encounter: Payer: Self-pay | Admitting: Cardiology

## 2020-08-13 ENCOUNTER — Ambulatory Visit (INDEPENDENT_AMBULATORY_CARE_PROVIDER_SITE_OTHER): Payer: 59 | Admitting: Cardiology

## 2020-08-13 ENCOUNTER — Other Ambulatory Visit: Payer: Self-pay

## 2020-08-13 VITALS — BP 120/60 | HR 63 | Ht 59.0 in | Wt 130.0 lb

## 2020-08-13 DIAGNOSIS — R0989 Other specified symptoms and signs involving the circulatory and respiratory systems: Secondary | ICD-10-CM | POA: Insufficient documentation

## 2020-08-13 DIAGNOSIS — I3139 Other pericardial effusion (noninflammatory): Secondary | ICD-10-CM

## 2020-08-13 DIAGNOSIS — R002 Palpitations: Secondary | ICD-10-CM | POA: Diagnosis not present

## 2020-08-13 DIAGNOSIS — E782 Mixed hyperlipidemia: Secondary | ICD-10-CM

## 2020-08-13 DIAGNOSIS — I2510 Atherosclerotic heart disease of native coronary artery without angina pectoris: Secondary | ICD-10-CM

## 2020-08-13 DIAGNOSIS — I5020 Unspecified systolic (congestive) heart failure: Secondary | ICD-10-CM

## 2020-08-13 DIAGNOSIS — I4819 Other persistent atrial fibrillation: Secondary | ICD-10-CM

## 2020-08-13 DIAGNOSIS — Z720 Tobacco use: Secondary | ICD-10-CM | POA: Insufficient documentation

## 2020-08-13 DIAGNOSIS — I255 Ischemic cardiomyopathy: Secondary | ICD-10-CM

## 2020-08-13 DIAGNOSIS — I313 Pericardial effusion (noninflammatory): Secondary | ICD-10-CM

## 2020-08-13 DIAGNOSIS — I10 Essential (primary) hypertension: Secondary | ICD-10-CM | POA: Diagnosis not present

## 2020-08-13 DIAGNOSIS — R931 Abnormal findings on diagnostic imaging of heart and coronary circulation: Secondary | ICD-10-CM

## 2020-08-13 HISTORY — DX: Tobacco use: Z72.0

## 2020-08-13 HISTORY — DX: Other persistent atrial fibrillation: I48.19

## 2020-08-13 HISTORY — DX: Abnormal findings on diagnostic imaging of heart and coronary circulation: R93.1

## 2020-08-13 HISTORY — DX: Atherosclerotic heart disease of native coronary artery without angina pectoris: I25.10

## 2020-08-13 HISTORY — DX: Pericardial effusion (noninflammatory): I31.3

## 2020-08-13 HISTORY — DX: Other specified symptoms and signs involving the circulatory and respiratory systems: R09.89

## 2020-08-13 HISTORY — DX: Other pericardial effusion (noninflammatory): I31.39

## 2020-08-13 HISTORY — DX: Ischemic cardiomyopathy: I25.5

## 2020-08-13 HISTORY — DX: Unspecified systolic (congestive) heart failure: I50.20

## 2020-08-13 LAB — BASIC METABOLIC PANEL
BUN/Creatinine Ratio: 10 — ABNORMAL LOW (ref 12–28)
BUN: 12 mg/dL (ref 8–27)
CO2: 24 mmol/L (ref 20–29)
Calcium: 10 mg/dL (ref 8.7–10.3)
Chloride: 100 mmol/L (ref 96–106)
Creatinine, Ser: 1.21 mg/dL — ABNORMAL HIGH (ref 0.57–1.00)
GFR calc Af Amer: 56 mL/min/{1.73_m2} — ABNORMAL LOW (ref >59)
GFR calc non Af Amer: 49 mL/min/{1.73_m2} — ABNORMAL LOW (ref >59)
Glucose: 105 mg/dL — ABNORMAL HIGH (ref 65–99)
Potassium: 3.9 mmol/L (ref 3.5–5.2)
Sodium: 141 mmol/L (ref 134–144)

## 2020-08-13 LAB — CBC
Hematocrit: 34.6 % (ref 34.0–46.6)
Hemoglobin: 11 g/dL — ABNORMAL LOW (ref 11.1–15.9)
MCH: 29 pg (ref 26.6–33.0)
MCHC: 31.8 g/dL (ref 31.5–35.7)
MCV: 91 fL (ref 79–97)
Platelets: 387 10*3/uL (ref 150–450)
RBC: 3.79 x10E6/uL (ref 3.77–5.28)
RDW: 15.7 % — ABNORMAL HIGH (ref 11.7–15.4)
WBC: 8.6 10*3/uL (ref 3.4–10.8)

## 2020-08-13 LAB — MAGNESIUM: Magnesium: 2 mg/dL (ref 1.6–2.3)

## 2020-08-13 MED ORDER — METOPROLOL SUCCINATE ER 25 MG PO TB24: 25.0000 mg | ORAL_TABLET | Freq: Every day | ORAL | 1 refills | Status: DC

## 2020-08-13 MED ORDER — FUROSEMIDE 40 MG PO TABS: 40.0000 mg | ORAL_TABLET | Freq: Every day | ORAL | 1 refills | Status: DC

## 2020-08-13 NOTE — Progress Notes (Signed)
Cardiology Office Note:    Date:  08/13/2020   ID:  Teresa [name], DOB 12-Apr-1961, MRN [id number]  PCP:  Carron Curie, MD  Cardiologist:  Thomasene Ripple, DO  Electrophysiologist:  None   Referring MD: Simone Curia, MD   " I feel much better, just can't still get around in the house like I used to yet"  History of Present Illness:    Teresa [name] is a 60 y.o. female with a hx of coronary artery disease status post CABG x4 (SVG to PDA, SVG to first obtuse marginal, SVG to diagonal and LIMA to LAD) on April 25, 2020, ischemic cardiomyopathy EF in September 2021 with severely depressed EF 25 to 30%, however in October 2021 her EF improved slightly to 35 to 40%, hypertension, hyperlipidemia, smoker, prolonged hospitalization from September overall 90 days, respiratory failure was on mechanical ventilation then status post tracheostomy.  The patient event occurred on September 17 where she had a syncope episode was taken to the hospital found to be NSTEMI then underwent an emergent left heart catheterization which showed multivessel CAD with proximal to mid LAD lesion, CTO of the left circumflex and subtotal occlusion of the RCA.  She underwent surgery.  Post surgery she had transient postop atrial fibrillation she was placed on amiodarone.  After hospitalization the patient was transferred to rehab and has recently been discharged to home.  She is here with her son [name] today she tells me that from a chest pain or shortness of breath perspective those are all improved her biggest problem is the physical limitation from getting around the house.  She stopped smoking the day of her incidence and had not picked up a cigarette but she is not really sure if she is quitting at this point.  She reports that she still does get the urge but is holding off.  Past Medical History:  Diagnosis Date  . Acute on chronic respiratory failure with hypoxia (HCC)   . Acute renal failure due to tubular necrosis (HCC)    . Atrial flutter (HCC)   . Cardiogenic shock (HCC)   . Coronary artery disease involving native coronary artery of native heart   . Depression, unspecified 10/09/2015  . Essential hypertension 10/06/2015  . Gastro-esophageal reflux disease without esophagitis 10/06/2015  . History of tonic-clonic seizures 04/25/2020  . Hyperlipidemia 10/06/2015  . Seizure disorder (HCC) 03/03/2016  . Shortness of breath 04/25/2020  . Syncope 04/25/2020    Past Surgical History:  Procedure Laterality Date  . APPENDECTOMY    . TONSILLECTOMY    . TOTAL ABDOMINAL HYSTERECTOMY    . TOTAL HIP ARTHROPLASTY      Current Medications: Current Meds  Medication Sig  . albuterol (VENTOLIN HFA) 108 (90 Base) MCG/ACT inhaler 8.5 g.  . aspirin 81 MG chewable tablet Chew by mouth.  Marland Kitchen atorvastatin (LIPITOR) 20 MG tablet Take 20 mg by mouth at bedtime.  Marland Kitchen b complex-vitamin c-folic acid (NEPHRO-VITE) 0.8 MG TABS tablet 1 tablet by Per OG tube route daily.  . citalopram (CELEXA) 20 MG tablet Take 20 mg by mouth daily.  . ergocalciferol (VITAMIN D2) 1.25 MG (50000 UT) capsule Take by mouth.  . meloxicam (MOBIC) 7.5 MG tablet Take 7.5 mg by mouth 2 (two) times daily as needed.  . metoprolol succinate (TOPROL-XL) 25 MG 24 hr tablet Take 1 tablet (25 mg total) by mouth daily. Take with or immediately following a meal.  . pantoprazole (PROTONIX) 40 MG tablet Take 40 mg by  mouth daily.  . Promethazine HCl 6.25 MG/5ML SOLN Take 5 mLs by mouth 4 (four) times daily.  . [DISCONTINUED] amiodarone (PACERONE) 200 MG tablet Take by mouth.  . [DISCONTINUED] furosemide (LASIX) 40 MG tablet Take 40 mg by mouth daily.  . [DISCONTINUED] metoprolol tartrate (LOPRESSOR) 25 MG tablet Take by mouth.  . [DISCONTINUED] pantoprazole (PROTONIX) 20 MG tablet Take by mouth.     Allergies:   Patient has no allergy information on record.   Social History   Socioeconomic History  . Marital status: Married    Spouse name: Not on file  . Number of  children: Not on file  . Years of education: Not on file  . Highest education level: Not on file  Occupational History  . Not on file  Tobacco Use  . Smoking status: Current Every Day Smoker    Packs/day: 1.50    Types: Cigarettes  . Smokeless tobacco: Never Used  Substance and Sexual Activity  . Alcohol use: Never  . Drug use: Never  . Sexual activity: Not on file  Other Topics Concern  . Not on file  Social History Narrative  . Not on file   Social Determinants of Health   Financial Resource Strain: Not on file  Food Insecurity: Not on file  Transportation Needs: Not on file  Physical Activity: Not on file  Stress: Not on file  Social Connections: Not on file     Family History: The patient's family history includes Diabetes in her paternal grandmother; Diabetes Mellitus I in her sister; Hypertension in her brother, father, mother, and sister.  ROS:   Review of Systems  Constitution: Negative for decreased appetite, fever and weight gain.  HENT: Negative for congestion, ear discharge, hoarse voice and sore throat.   Eyes: Negative for discharge, redness, vision loss in right eye and visual halos.  Cardiovascular: Negative for chest pain, dyspnea on exertion, leg swelling, orthopnea and palpitations.  Respiratory: Negative for cough, hemoptysis, shortness of breath and snoring.   Endocrine: Negative for heat intolerance and polyphagia.  Hematologic/Lymphatic: Negative for bleeding problem. Does not bruise/bleed easily.  Skin: Negative for flushing, nail changes, rash and suspicious lesions.  Musculoskeletal: Negative for arthritis, joint pain, muscle cramps, myalgias, neck pain and stiffness.  Gastrointestinal: Negative for abdominal pain, bowel incontinence, diarrhea and excessive appetite.  Genitourinary: Negative for decreased libido, genital sores and incomplete emptying.  Neurological: Negative for brief paralysis, focal weakness, headaches and loss of balance.   Psychiatric/Behavioral: Negative for altered mental status, depression and suicidal ideas.  Allergic/Immunologic: Negative for HIV exposure and persistent infections.    EKGs/Labs/Other Studies Reviewed:    The following studies were reviewed today:   EKG:  The ekg ordered today demonstrates sinus rhythm, heart rate 63 bpm with underlying right bundle branch block.  TTE SUMMARY 05/18/2020 The left ventricle is mildly dilated. LV ejection fraction = 35-40%.  Left ventricular systolic function is moderately reduced. Akinesis of  the basal to mid inferolateral and inferior walls.  The right ventricle is mildly dilated. The right ventricular systolic function is normal.   There is trace mitral regurgitation.  There is moderate size pericardial effusion. Pericardial effusion is located posteriorly. Pericardial thickening also noted.   Compared to the last study dated 05/11/20, improvement in LVEF noted. Increase in size of psoterior pericardiale ffusion noted (1.3 to 2cm).    Recent Labs: 06/03/2020: ALT 29; TSH 1.863 07/15/2020: BUN 11; Creatinine, Ser 0.86; Hemoglobin 8.4; Platelets 412; Potassium 3.7; Sodium 138 07/16/2020:  Magnesium 1.9  Recent Lipid Panel No results found for: CHOL, TRIG, HDL, CHOLHDL, VLDL, LDLCALC, LDLDIRECT  Physical Exam:    VS:  BP 120/60 (BP Location: Right Arm)   Pulse 63   Ht 4\' 11"  (1.499 m)   Wt 130 lb (59 kg)   SpO2 94%   BMI 26.26 kg/m     Wt Readings from Last 3 Encounters:  08/13/20 130 lb (59 kg)     GEN: Well nourished, well developed in no acute distress HEENT: Normal NECK: No JVD; No carotid bruits LYMPHATICS: No lymphadenopathy CARDIAC: S1S2 noted,RRR, no murmurs, rubs, gallops RESPIRATORY:  Clear to auscultation without rales, wheezing or rhonchi  ABDOMEN: Soft, non-tender, non-distended, +bowel sounds, no guarding. EXTREMITIES: No edema, No cyanosis, no clubbing MUSCULOSKELETAL:  No deformity  SKIN: Warm and dry NEUROLOGIC:   Alert and oriented x 3, non-focal PSYCHIATRIC:  Normal affect, good insight  ASSESSMENT:    1. Coronary artery disease, unspecified vessel or lesion type, unspecified whether angina present, unspecified whether native or transplanted heart   2. Depressed left ventricular ejection fraction   3. Essential hypertension   4. Mixed hyperlipidemia   5. Ischemic cardiomyopathy   6. HFrEF (heart failure with reduced ejection fraction) (HCC)   7. Tobacco use   8. Pericardial effusion   9. Post operative atrial fibrillation (HCC)    PLAN:    The patient is recovering from her long hospitalization stay.  She has no angina symptoms she is status post coronary artery bypass graft x4 she is recovering well.  She did have inpatient rehab but still have difficulty with  moving around the house.  We will like to do is continue patient on her aspirinPlease tell me when and statin at this time.  She ideally should be on high intensity statin she is on Lipitor 20 mg.  I am going to repeat her lipid profile and if her LDL is greater than 50 I will start the patient on high intensity statin with possibly Zetia.  One of the biggest thing for her recovery is cardiac rehab which I am going [facility] the [facility].  For postop atrial fibrillation she is in sinus rhythm today however this is on amiodarone I am going to stop the amiodarone she is more than 12 weeks post surgery so I am going to place a monitor and the patient to see if there is any recurrence of the A. fib.  Should it be recurrence of A. fib this made no longer be postop A. fib and she will need to be treated for this.  We discussed all of her medications in the office today.  Her amiodarone will be stopped.  She is on Lopressor I am going to stop the Lopressor and started patient on Toprol-XL for more cardiac appropriate reduced ejection fraction medication.  She is on lisinopril at home mom cannot stop this medication as I  would like to convert her to [facility].  In the meantime I am going to get a repeat echocardiogram to see if there is any improvement in her ejection fraction as well as is there any resolution for her pericardial effusion  So the big issue here is the patient may go back to smoking as she is not convinced if she is quit yet she has the urge and according to her son the only reason why she is not smoking is because he had not bought her cigarettes.  I did advise  the patient again smoking and educated her that going back to smoking may make her situation worse.  Blood work to be done today which include BMP, mag, CBC.  The patient is in agreement with the above plan. The patient left the office in stable condition.  The patient will follow up in 4 weeks or sooner if needed.   Medication Adjustments/Labs and Tests Ordered: Current medicines are reviewed at length with the patient today.  Concerns regarding medicines are outlined above.  Orders Placed This Encounter  Procedures  . Basic metabolic panel  . Magnesium  . CBC  . AMB referral to cardiac rehabilitation  . LONG TERM MONITOR (3-14 DAYS)  . EKG 12-Lead  . ECHOCARDIOGRAM COMPLETE   Meds ordered this encounter  Medications  . furosemide (LASIX) 40 MG tablet    Sig: Take 1 tablet (40 mg total) by mouth daily.    Dispense:  90 tablet    Refill:  1  . metoprolol succinate (TOPROL-XL) 25 MG 24 hr tablet    Sig: Take 1 tablet (25 mg total) by mouth daily. Take with or immediately following a meal.    Dispense:  90 tablet    Refill:  1    Patient Instructions  Medication Instructions:  Your physician has recommended you make the following change in your medication:   STOP: Amiodarone  STOP: Lisinopril  STOP: Metoprolol tartrate   START: Metoprolol succinate 25 mg daily   *If you need a refill on your cardiac medications before your next appointment, please call your pharmacy*   Lab Work: Your physician recommends that you  return for lab work today: bmp, mg, cbc  If you have labs (blood work) drawn today and your tests are completely normal, you will receive your results only by: Marland Kitchen MyChart Message (if you have MyChart) OR . A paper copy in the mail If you have any lab test that is abnormal or we need to change your treatment, we will call you to review the results.   Testing/Procedures: Your physician has requested that you have an echocardiogram. Echocardiography is a painless test that uses sound waves to create images of your heart. It provides your doctor with information about the size and shape of your heart and how well your heart's chambers and valves are working. This procedure takes approximately one hour. There are no restrictions for this procedure.  A zio monitor was ordered today. It will remain on for 14 days. You will then return monitor and event diary in provided box. It takes 1-2 weeks for report to be downloaded and returned to [place]. We will call you with the results. If monitor falls off or has orange flashing light, please call Zio for further instructions.      Follow-Up: At [facility], you and your health needs are our priority.  As part of our continuing mission to provide you with exceptional heart care, we have created designated Provider Care Teams.  These Care Teams include your primary Cardiologist (physician) and Advanced Practice Providers (APPs -  Physician Assistants and Nurse Practitioners) who all work together to provide you with the care you need, when you need it.  We recommend signing up for the patient portal called "MyChart".  Sign up information is provided on this After Visit Summary.  MyChart is used to connect with patients for Virtual Visits (Telemedicine).  Patients are able to view lab/test results, encounter notes, upcoming appointments, etc.  Non-urgent messages can be sent to your  provider as well.   To learn more about what you can do with MyChart, go to  [email or web address].    Your next appointment:   1 month(s)  The format for your next appointment:   In Person  Provider:   Thomasene Ripple, DO   Other Instructions   Echocardiogram An echocardiogram is a procedure that uses painless sound waves (ultrasound) to produce an image of the heart. Images from an echocardiogram can provide important information about:  Signs of coronary artery disease (CAD).  Aneurysm detection. An aneurysm is a weak or damaged part of an artery wall that bulges out from the normal force of blood pumping through the body.  Heart size and shape. Changes in the size or shape of the heart can be associated with certain conditions, including heart failure, aneurysm, and CAD.  Heart muscle function.  Heart valve function.  Signs of a past heart attack.  Fluid buildup around the heart.  Thickening of the heart muscle.  A tumor or infectious growth around the heart valves. Tell a health care provider about:  Any allergies you have.  All medicines you are taking, including vitamins, herbs, eye drops, creams, and over-the-counter medicines.  Any blood disorders you have.  Any surgeries you have had.  Any medical conditions you have.  Whether you are pregnant or may be pregnant. What are the risks? Generally, this is a safe procedure. However, problems may occur, including:  Allergic reaction to dye (contrast) that may be used during the procedure. What happens before the procedure? No specific preparation is needed. You may eat and drink normally. What happens during the procedure?   An IV tube may be inserted into one of your veins.  You may receive contrast through this tube. A contrast is an injection that improves the quality of the pictures from your heart.  A gel will be applied to your chest.  A wand-like tool (transducer) will be moved over your chest. The gel will help to transmit the sound waves from the transducer.  The  sound waves will harmlessly bounce off of your heart to allow the heart images to be captured in real-time motion. The images will be recorded on a computer. The procedure may vary among health care providers and hospitals. What happens after the procedure?  You may return to your normal, everyday life, including diet, activities, and medicines, unless your health care provider tells you not to do that. Summary  An echocardiogram is a procedure that uses painless sound waves (ultrasound) to produce an image of the heart.  Images from an echocardiogram can provide important information about the size and shape of your heart, heart muscle function, heart valve function, and fluid buildup around your heart.  You do not need to do anything to prepare before this procedure. You may eat and drink normally.  After the echocardiogram is completed, you may return to your normal, everyday life, unless your health care provider tells you not to do that. This information is not intended to replace advice given to you by your health care provider. Make sure you discuss any questions you have with your health care provider. Document Revised: 11/14/2018 Document Reviewed: 08/26/2016 Elsevier Patient Education  2020 Elsevier Inc.  Metoprolol Extended-Release Tablets What is this medicine? METOPROLOL (me TOE proe lole) is a beta blocker. It decreases the amount of work your heart has to do and helps your heart beat regularly. It treats high blood pressure and/or prevent chest pain (also called  angina). It also treats heart failure. This medicine may be used for other purposes; ask your health care provider or pharmacist if you have questions. COMMON BRAND NAME(S): toprol, Toprol XL What should I tell my health care provider before I take this medicine? They need to know if you have any of these conditions:  diabetes  heart or vessel disease like slow heart rate, worsening heart failure, heart block, sick  sinus syndrome or Raynaud's disease  kidney disease  liver disease  lung or breathing disease, like asthma or emphysema  pheochromocytoma  thyroid disease  an unusual or allergic reaction to metoprolol, other beta-blockers, medicines, foods, dyes, or preservatives  pregnant or trying to get pregnant  breast-feeding How should I use this medicine? Take this drug by mouth. Take it as directed on the prescription label at the same time every day. Take it with food. You may cut the tablet in half if it is scored (has a line in the middle of it). This may help you swallow the tablet if the whole tablet is too big. Be sure to take both halves. Do not take just one-half of the tablet. Keep taking it unless your health care provider tells you to stop. Talk to your health care provider about the use of this drug in children. While it may be prescribed for children as young as 6 for selected conditions, precautions do apply. Overdosage: If you think you have taken too much of this medicine contact a poison control center or emergency room at once. NOTE: This medicine is only for you. Do not share this medicine with others. What if I miss a dose? If you miss a dose, take it as soon as you can. If it is almost time for your next dose, take only that dose. Do not take double or extra doses. What may interact with this medicine? This medicine may interact with the following medications:  certain medicines for blood pressure, heart disease, irregular heart beat  certain medicines for depression, like monoamine oxidase (MAO) inhibitors, fluoxetine, or paroxetine  clonidine  dobutamine  epinephrine  isoproterenol  reserpine This list may not describe all possible interactions. Give your health care provider a list of all the medicines, herbs, non-prescription drugs, or dietary supplements you use. Also tell them if you smoke, drink alcohol, or use illegal drugs. Some items may interact with  your medicine. What should I watch for while using this medicine? Visit your doctor or health care professional for regular check ups. Contact your doctor right away if your symptoms worsen. Check your blood pressure and pulse rate regularly. Ask your health care professional what your blood pressure and pulse rate should be, and when you should contact them. You may get drowsy or dizzy. Do not drive, use machinery, or do anything that needs mental alertness until you know how this medicine affects you. Do not sit or stand up quickly, especially if you are an older patient. This reduces the risk of dizzy or fainting spells. Contact your doctor if these symptoms continue. Alcohol may interfere with the effect of this medicine. Avoid alcoholic drinks. This medicine may increase blood sugar. Ask your healthcare provider if changes in diet or medicines are needed if you have diabetes. What side effects may I notice from receiving this medicine? Side effects that you should report to your doctor or health care professional as soon as possible:  allergic reactions like skin rash, itching or hives  cold or numb hands or feet  depression  difficulty breathing  faint  fever with sore throat  irregular heartbeat, chest pain  rapid weight gain   signs and symptoms of high blood sugar such as being more thirsty or hungry or having to urinate more than normal. You may also feel very tired or have blurry vision.  swollen legs or ankles Side effects that usually do not require medical attention (report to your doctor or health care professional if they continue or are bothersome):  anxiety or nervousness  change in sex drive or performance  dry skin  headache  nightmares or trouble sleeping  short term memory loss  stomach upset or diarrhea This list may not describe all possible side effects. Call your doctor for medical advice about side effects. You may report side effects to FDA at  1-800-FDA-1088. Where should I keep my medicine? Keep out of the reach of children and pets. Store at room temperature between 20 and 25 degrees C (68 and 77 degrees F). Throw away any unused drug after the expiration date. NOTE: This sheet is a summary. It may not cover all possible information. If you have questions about this medicine, talk to your doctor, pharmacist, or health care provider.  2020 Elsevier/Gold Standard (2019-03-06 18:23:00)      Adopting a Healthy Lifestyle.  Know what a healthy weight is for you (roughly BMI <25) and aim to maintain this   Aim for 7+ servings of fruits and vegetables daily   65-80+ fluid ounces of water or unsweet tea for healthy kidneys   Limit to max 1 drink of alcohol per day; avoid smoking/tobacco   Limit animal fats in diet for cholesterol and heart health - choose grass fed whenever available   Avoid highly processed foods, and foods high in saturated/trans fats   Aim for low stress - take time to unwind and care for your mental health   Aim for 150 min of moderate intensity exercise weekly for heart health, and weights twice weekly for bone health   Aim for 7-9 hours of sleep daily   When it comes to diets, agreement about the perfect plan isnt easy to find, even among the experts. Experts at the [employer] developed an idea known as the Healthy Eating Plate. Just imagine a plate divided into logical, healthy portions.   The emphasis is on diet quality:   Load up on vegetables and fruits - one-half of your plate: Aim for color and variety, and remember that potatoes dont count.   Go for whole grains - one-quarter of your plate: Whole wheat, barley, wheat berries, quinoa, oats, brown rice, and foods made with them. If you want pasta, go with whole wheat pasta.   Protein power - one-quarter of your plate: Fish, chicken, beans, and nuts are all healthy, versatile protein sources. Limit red meat.   The diet,  however, does go beyond the plate, offering a few other suggestions.   Use healthy plant oils, such as olive, canola, soy, corn, sunflower and peanut. Check the labels, and avoid partially hydrogenated oil, which have unhealthy trans fats.   If youre thirsty, drink water. Coffee and tea are good in moderation, but skip sugary drinks and limit milk and dairy products to one or two daily servings.   The type of carbohydrate in the diet is more important than the amount. Some sources of carbohydrates, such as vegetables, fruits, whole grains, and beans-are healthier than others.   Finally, stay active  Signed,  Berniece Salines, DO  08/13/2020 11:27 AM    [facility]

## 2020-08-13 NOTE — Patient Instructions (Signed)
Medication Instructions:  Your physician has recommended you make the following change in your medication:   STOP: Amiodarone  STOP: Lisinopril  STOP: Metoprolol tartrate   START: Metoprolol succinate 25 mg daily   *If you need a refill on your cardiac medications before your next appointment, please call your pharmacy*   Lab Work: Your physician recommends that you return for lab work today: bmp, mg, cbc  If you have labs (blood work) drawn today and your tests are completely normal, you will receive your results only by: Marland Kitchen MyChart Message (if you have MyChart) OR . A paper copy in the mail If you have any lab test that is abnormal or we need to change your treatment, we will call you to review the results.   Testing/Procedures: Your physician has requested that you have an echocardiogram. Echocardiography is a painless test that uses sound waves to create images of your heart. It provides your doctor with information about the size and shape of your heart and how well your heart's chambers and valves are working. This procedure takes approximately one hour. There are no restrictions for this procedure.  A zio monitor was ordered today. It will remain on for 14 days. You will then return monitor and event diary in provided box. It takes 1-2 weeks for report to be downloaded and returned to [place]. We will call you with the results. If monitor falls off or has orange flashing light, please call Zio for further instructions.      Follow-Up: At [facility], you and your health needs are our priority.  As part of our continuing mission to provide you with exceptional heart care, we have created designated Provider Care Teams.  These Care Teams include your primary Cardiologist (physician) and Advanced Practice Providers (APPs -  Physician Assistants and Nurse Practitioners) who all work together to provide you with the care you need, when you need it.  We recommend signing up for the  patient portal called "MyChart".  Sign up information is provided on this After Visit Summary.  MyChart is used to connect with patients for Virtual Visits (Telemedicine).  Patients are able to view lab/test results, encounter notes, upcoming appointments, etc.  Non-urgent messages can be sent to your provider as well.   To learn more about what you can do with MyChart, go to [email or web address].    Your next appointment:   1 month(s)  The format for your next appointment:   In Person  Provider:   Thomasene Ripple, DO   Other Instructions   Echocardiogram An echocardiogram is a procedure that uses painless sound waves (ultrasound) to produce an image of the heart. Images from an echocardiogram can provide important information about:  Signs of coronary artery disease (CAD).  Aneurysm detection. An aneurysm is a weak or damaged part of an artery wall that bulges out from the normal force of blood pumping through the body.  Heart size and shape. Changes in the size or shape of the heart can be associated with certain conditions, including heart failure, aneurysm, and CAD.  Heart muscle function.  Heart valve function.  Signs of a past heart attack.  Fluid buildup around the heart.  Thickening of the heart muscle.  A tumor or infectious growth around the heart valves. Tell a health care provider about:  Any allergies you have.  All medicines you are taking, including vitamins, herbs, eye drops, creams, and over-the-counter medicines.  Any blood disorders you have.  Any surgeries you  have had.  Any medical conditions you have.  Whether you are pregnant or may be pregnant. What are the risks? Generally, this is a safe procedure. However, problems may occur, including:  Allergic reaction to dye (contrast) that may be used during the procedure. What happens before the procedure? No specific preparation is needed. You may eat and drink normally. What happens during the  procedure?   An IV tube may be inserted into one of your veins.  You may receive contrast through this tube. A contrast is an injection that improves the quality of the pictures from your heart.  A gel will be applied to your chest.  A wand-like tool (transducer) will be moved over your chest. The gel will help to transmit the sound waves from the transducer.  The sound waves will harmlessly bounce off of your heart to allow the heart images to be captured in real-time motion. The images will be recorded on a computer. The procedure may vary among health care providers and hospitals. What happens after the procedure?  You may return to your normal, everyday life, including diet, activities, and medicines, unless your health care provider tells you not to do that. Summary  An echocardiogram is a procedure that uses painless sound waves (ultrasound) to produce an image of the heart.  Images from an echocardiogram can provide important information about the size and shape of your heart, heart muscle function, heart valve function, and fluid buildup around your heart.  You do not need to do anything to prepare before this procedure. You may eat and drink normally.  After the echocardiogram is completed, you may return to your normal, everyday life, unless your health care provider tells you not to do that. This information is not intended to replace advice given to you by your health care provider. Make sure you discuss any questions you have with your health care provider. Document Revised: 11/14/2018 Document Reviewed: 08/26/2016 Elsevier Patient Education  2020 Elsevier [employer].  Metoprolol Extended-Release Tablets What is this medicine? METOPROLOL (me TOE proe lole) is a beta blocker. It decreases the amount of work your heart has to do and helps your heart beat regularly. It treats high blood pressure and/or prevent chest pain (also called angina). It also treats heart failure. This  medicine may be used for other purposes; ask your health care provider or pharmacist if you have questions. COMMON BRAND NAME(S): toprol, Toprol XL What should I tell my health care provider before I take this medicine? They need to know if you have any of these conditions:  diabetes  heart or vessel disease like slow heart rate, worsening heart failure, heart block, sick sinus syndrome or Raynaud's disease  kidney disease  liver disease  lung or breathing disease, like asthma or emphysema  pheochromocytoma  thyroid disease  an unusual or allergic reaction to metoprolol, other beta-blockers, medicines, foods, dyes, or preservatives  pregnant or trying to get pregnant  breast-feeding How should I use this medicine? Take this drug by mouth. Take it as directed on the prescription label at the same time every day. Take it with food. You may cut the tablet in half if it is scored (has a line in the middle of it). This may help you swallow the tablet if the whole tablet is too big. Be sure to take both halves. Do not take just one-half of the tablet. Keep taking it unless your health care provider tells you to stop. Talk to your health care  provider about the use of this drug in children. While it may be prescribed for children as young as 6 for selected conditions, precautions do apply. Overdosage: If you think you have taken too much of this medicine contact a poison control center or emergency room at once. NOTE: This medicine is only for you. Do not share this medicine with others. What if I miss a dose? If you miss a dose, take it as soon as you can. If it is almost time for your next dose, take only that dose. Do not take double or extra doses. What may interact with this medicine? This medicine may interact with the following medications:  certain medicines for blood pressure, heart disease, irregular heart beat  certain medicines for depression, like monoamine oxidase (MAO)  inhibitors, fluoxetine, or paroxetine  clonidine  dobutamine  epinephrine  isoproterenol  reserpine This list may not describe all possible interactions. Give your health care provider a list of all the medicines, herbs, non-prescription drugs, or dietary supplements you use. Also tell them if you smoke, drink alcohol, or use illegal drugs. Some items may interact with your medicine. What should I watch for while using this medicine? Visit your doctor or health care professional for regular check ups. Contact your doctor right away if your symptoms worsen. Check your blood pressure and pulse rate regularly. Ask your health care professional what your blood pressure and pulse rate should be, and when you should contact them. You may get drowsy or dizzy. Do not drive, use machinery, or do anything that needs mental alertness until you know how this medicine affects you. Do not sit or stand up quickly, especially if you are an older patient. This reduces the risk of dizzy or fainting spells. Contact your doctor if these symptoms continue. Alcohol may interfere with the effect of this medicine. Avoid alcoholic drinks. This medicine may increase blood sugar. Ask your healthcare provider if changes in diet or medicines are needed if you have diabetes. What side effects may I notice from receiving this medicine? Side effects that you should report to your doctor or health care professional as soon as possible:  allergic reactions like skin rash, itching or hives  cold or numb hands or feet  depression  difficulty breathing  faint  fever with sore throat  irregular heartbeat, chest pain  rapid weight gain   signs and symptoms of high blood sugar such as being more thirsty or hungry or having to urinate more than normal. You may also feel very tired or have blurry vision.  swollen legs or ankles Side effects that usually do not require medical attention (report to your doctor or health  care professional if they continue or are bothersome):  anxiety or nervousness  change in sex drive or performance  dry skin  headache  nightmares or trouble sleeping  short term memory loss  stomach upset or diarrhea This list may not describe all possible side effects. Call your doctor for medical advice about side effects. You may report side effects to FDA at 1-800-FDA-1088. Where should I keep my medicine? Keep out of the reach of children and pets. Store at room temperature between 20 and 25 degrees C (68 and 77 degrees F). Throw away any unused drug after the expiration date. NOTE: This sheet is a summary. It may not cover all possible information. If you have questions about this medicine, talk to your doctor, pharmacist, or health care provider.  2020 Elsevier/Gold Standard (2019-03-06 18:23:00)

## 2020-08-16 ENCOUNTER — Telehealth: Payer: Self-pay

## 2020-08-16 MED ORDER — FUROSEMIDE 40 MG PO TABS: 40.0000 mg | ORAL_TABLET | ORAL | 1 refills | Status: DC

## 2020-08-16 NOTE — Telephone Encounter (Signed)
-----   Message from Kardie Tobb, DO sent at 08/13/2020  5:51 PM EST ----- Her kidney function is elevated, have her change her Lasix to 40 mg every other day.  Please asked the patient if she still is taking meloxicam if she is I would really like her to stop this medication given her heart condition. 

## 2020-08-16 NOTE — Telephone Encounter (Signed)
Spoke with patient regarding results and recommendation.  Patient verbalizes understanding and is agreeable to plan of care. Advised patient to call back with any issues or concerns.  

## 2020-08-16 NOTE — Telephone Encounter (Signed)
-----   Message from Thomasene Ripple, DO sent at 08/13/2020  5:51 PM EST ----- Her kidney function is elevated, have her change her Lasix to 40 mg every other day.  Please asked the patient if she still is taking meloxicam if she is I would really like her to stop this medication given her heart condition.

## 2020-08-16 NOTE — Telephone Encounter (Signed)
Left message on patients voicemail to please return our call.   

## 2020-08-19 ENCOUNTER — Telehealth: Payer: Self-pay | Admitting: Cardiology

## 2020-08-19 NOTE — Telephone Encounter (Signed)
New Message:     Pt said she lost her job on yesterday(08-18-20). She wants to know if there is anyway she can see Dr Servando Salina this month instead of February. She says her insurance expires on 09-06-20.

## 2020-08-31 ENCOUNTER — Telehealth: Payer: Self-pay | Admitting: Cardiology

## 2020-08-31 NOTE — Telephone Encounter (Signed)
08/31/20 Patient son, [name], dropped off Short term disability forms to the office. Consent and billing she was completed. $29.00 cash received and placed in safe. Forms placed in Dr. Mallory Shirk box for completion.Noreene Filbert

## 2020-08-31 NOTE — Telephone Encounter (Signed)
Dr. Servando Salina requested the patient's PCP to fill the forms out as they are questions she cannot answer. I called patient's son and left a message, called patient and she states she will call her son and inform him as I did also.  Papers are at front desk and patient stated her son could come by tomorrow to pick them up.

## 2020-09-10 ENCOUNTER — Other Ambulatory Visit: Payer: Self-pay

## 2020-09-10 ENCOUNTER — Ambulatory Visit (INDEPENDENT_AMBULATORY_CARE_PROVIDER_SITE_OTHER): Payer: 59

## 2020-09-10 DIAGNOSIS — R0989 Other specified symptoms and signs involving the circulatory and respiratory systems: Secondary | ICD-10-CM

## 2020-09-10 DIAGNOSIS — I2510 Atherosclerotic heart disease of native coronary artery without angina pectoris: Secondary | ICD-10-CM

## 2020-09-10 LAB — ECHOCARDIOGRAM COMPLETE
Area-P 1/2: 3.65 cm2
Calc EF: 41.6 %
P 1/2 time: 586 msec
S' Lateral: 4.6 cm
Single Plane A2C EF: 40.9 %
Single Plane A4C EF: 44.3 %

## 2020-09-10 NOTE — Progress Notes (Signed)
Complete echocardiogram performed.  Jimmy Omkar Stratmann RDCS, RVT  

## 2020-09-17 ENCOUNTER — Ambulatory Visit: Payer: 59 | Admitting: Cardiology

## 2020-09-17 ENCOUNTER — Encounter: Payer: Self-pay | Admitting: Cardiology

## 2020-09-17 ENCOUNTER — Other Ambulatory Visit: Payer: Self-pay

## 2020-09-17 VITALS — BP 96/66 | HR 72 | Ht 59.0 in | Wt 131.6 lb

## 2020-09-17 DIAGNOSIS — I4819 Other persistent atrial fibrillation: Secondary | ICD-10-CM

## 2020-09-17 DIAGNOSIS — E782 Mixed hyperlipidemia: Secondary | ICD-10-CM

## 2020-09-17 DIAGNOSIS — R0989 Other specified symptoms and signs involving the circulatory and respiratory systems: Secondary | ICD-10-CM

## 2020-09-17 DIAGNOSIS — I2510 Atherosclerotic heart disease of native coronary artery without angina pectoris: Secondary | ICD-10-CM

## 2020-09-17 HISTORY — DX: Atherosclerotic heart disease of native coronary artery without angina pectoris: I25.10

## 2020-09-17 HISTORY — DX: Mixed hyperlipidemia: E78.2

## 2020-09-17 MED ORDER — METOPROLOL SUCCINATE ER 25 MG PO TB24: 12.5000 mg | ORAL_TABLET | Freq: Every day | ORAL | 1 refills | Status: DC

## 2020-09-17 MED ORDER — FUROSEMIDE 40 MG PO TABS: 20.0000 mg | ORAL_TABLET | ORAL | 1 refills | Status: DC

## 2020-09-17 NOTE — Progress Notes (Signed)
Cardiology Office Note:    Date:  09/17/2020   ID:  Teresa [name], DOB February 18, 1961, MRN [id number]  PCP:  Carron Curie, MD  Cardiologist:  Thomasene Ripple, DO  Electrophysiologist:  None   Referring MD: Carron Curie, MD   I am doing ok  History of Present Illness:    Teresa [name] is a 60 y.o. female with a hx of coronary artery disease status post CABG x4 (SVG to PDA, SVG to first obtuse marginal, SVG to diagonal and LIMA to LAD) on April 25, 2020, ischemic cardiomyopathy EF in September 2021 with severely depressed EF 25 to 30%, however in October 2021 her EF improved slightly to 35 to 40%, hypertension, hyperlipidemia, smoker, prolonged hospitalization from September overall 90 days, respiratory failure was on mechanical ventilation then status post tracheostomy.  I saw the patient in January 2022 at that time she was just back home from rehab.  We continue her medications which include her aspirin as well as her Lipitor.  We also repeated lab work for lipid profile.  During our visit she was still on amiodarone for her postop atrial fibrillation.   She is here today for follow-up visit.  With her son [name].  Past Medical History:  Diagnosis Date  . Acute on chronic respiratory failure with hypoxia (HCC)   . Acute renal failure due to tubular necrosis (HCC)   . Atrial flutter (HCC)   . Cardiogenic shock (HCC)   . Coronary artery disease involving native coronary artery of native heart   . Depression, unspecified 10/09/2015  . Essential hypertension 10/06/2015  . Gastro-esophageal reflux disease without esophagitis 10/06/2015  . History of tonic-clonic seizures 04/25/2020  . Hyperlipidemia 10/06/2015  . Seizure disorder (HCC) 03/03/2016  . Shortness of breath 04/25/2020  . Syncope 04/25/2020    Past Surgical History:  Procedure Laterality Date  . APPENDECTOMY    . TONSILLECTOMY    . TOTAL ABDOMINAL HYSTERECTOMY    . TOTAL HIP ARTHROPLASTY      Current Medications: Current Meds   Medication Sig  . albuterol (VENTOLIN HFA) 108 (90 Base) MCG/ACT inhaler 8.5 g.  . aspirin 81 MG chewable tablet Chew by mouth.  Marland Kitchen atorvastatin (LIPITOR) 20 MG tablet Take 20 mg by mouth at bedtime.  Marland Kitchen b complex-vitamin c-folic acid (NEPHRO-VITE) 0.8 MG TABS tablet 1 tablet by Per OG tube route daily.  . citalopram (CELEXA) 20 MG tablet Take 20 mg by mouth daily.  . ergocalciferol (VITAMIN D2) 1.25 MG (50000 UT) capsule Take by mouth.  . pantoprazole (PROTONIX) 40 MG tablet Take 40 mg by mouth daily.  . Promethazine HCl 6.25 MG/5ML SOLN Take 5 mLs by mouth 4 (four) times daily.  . [DISCONTINUED] furosemide (LASIX) 40 MG tablet Take 1 tablet (40 mg total) by mouth every other day.  . [DISCONTINUED] metoprolol succinate (TOPROL-XL) 25 MG 24 hr tablet Take 1 tablet (25 mg total) by mouth daily. Take with or immediately following a meal.     Allergies:   Patient has no allergy information on record.   Social History   Socioeconomic History  . Marital status: Married    Spouse name: Not on file  . Number of children: Not on file  . Years of education: Not on file  . Highest education level: Not on file  Occupational History  . Not on file  Tobacco Use  . Smoking status: Current Every Day Smoker    Packs/day: 1.50    Types: Cigarettes  . Smokeless  tobacco: Never Used  Substance and Sexual Activity  . Alcohol use: Never  . Drug use: Never  . Sexual activity: Not on file  Other Topics Concern  . Not on file  Social History Narrative  . Not on file   Social Determinants of Health   Financial Resource Strain: Not on file  Food Insecurity: Not on file  Transportation Needs: Not on file  Physical Activity: Not on file  Stress: Not on file  Social Connections: Not on file     Family History: The patient's family history includes Diabetes in her paternal grandmother; Diabetes Mellitus I in her sister; Hypertension in her brother, father, mother, and sister.  ROS:   Review of  Systems  Constitution: Negative for decreased appetite, fever and weight gain.  HENT: Negative for congestion, ear discharge, hoarse voice and sore throat.   Eyes: Negative for discharge, redness, vision loss in right eye and visual halos.  Cardiovascular: Negative for chest pain, dyspnea on exertion, leg swelling, orthopnea and palpitations.  Respiratory: Negative for cough, hemoptysis, shortness of breath and snoring.   Endocrine: Negative for heat intolerance and polyphagia.  Hematologic/Lymphatic: Negative for bleeding problem. Does not bruise/bleed easily.  Skin: Negative for flushing, nail changes, rash and suspicious lesions.  Musculoskeletal: Negative for arthritis, joint pain, muscle cramps, myalgias, neck pain and stiffness.  Gastrointestinal: Negative for abdominal pain, bowel incontinence, diarrhea and excessive appetite.  Genitourinary: Negative for decreased libido, genital sores and incomplete emptying.  Neurological: Negative for brief paralysis, focal weakness, headaches and loss of balance.  Psychiatric/Behavioral: Negative for altered mental status, depression and suicidal ideas.  Allergic/Immunologic: Negative for HIV exposure and persistent infections.    EKGs/Labs/Other Studies Reviewed:    The following studies were reviewed today:   EKG: None today  Transthoracic  Echocardiogram IMPRESSIONS  1. Left ventricular ejection fraction, by estimation, is 40 to 45%. The left ventricle has mildly decreased function. The left ventricle has no regional wall motion abnormalities. The left ventricular internal cavity size was mildly dilated. Left  ventricular diastolic parameters were normal.  2. Right ventricular systolic function is normal. The right ventricular size is normal. There is normal pulmonary artery systolic pressure.  3. The mitral valve is normal in structure. No evidence of mitral valve regurgitation. No evidence of mitral stenosis.  4. The aortic valve is  normal in structure. Aortic valve regurgitation is mild. No aortic stenosis is present.  5. The inferior vena cava is normal in size with greater than 50% respiratory variability, suggesting right atrial pressure of 3 mmHg.   FINDINGS  Left Ventricle: Left ventricular ejection fraction, by estimation, is 40 to 45%. The left ventricle has mildly decreased function. The left ventricle has no regional wall motion abnormalities. The left ventricular internal cavity size was mildly  dilated. There is no left ventricular hypertrophy. Left ventricular diastolic parameters were normal.     LV Wall Scoring:  The mid and distal inferior wall is hypokinetic.   Right Ventricle: The right ventricular size is normal. No increase in  right ventricular wall thickness. Right ventricular systolic function is  normal. There is normal pulmonary artery systolic pressure. The tricuspid  regurgitant velocity is 1.91 m/s, and  with an assumed right atrial pressure of 3 mmHg, the estimated right  ventricular systolic pressure is 17.6 mmHg.   Left Atrium: Left atrial size was normal in size.   Right Atrium: Right atrial size was normal in size.   Pericardium: There is no evidence of  pericardial effusion.   Mitral Valve: The mitral valve is normal in structure. No evidence of  mitral valve regurgitation. No evidence of mitral valve stenosis.   Tricuspid Valve: The tricuspid valve is normal in structure. Tricuspid  valve regurgitation is trivial. No evidence of tricuspid stenosis.   Aortic Valve: The aortic valve is normal in structure. Aortic valve  regurgitation is mild. Aortic regurgitation PHT measures 586 msec. No  aortic stenosis is present.   Pulmonic Valve: The pulmonic valve was normal in structure. Pulmonic valve  regurgitation is not visualized. No evidence of pulmonic stenosis.   Aorta: The aortic root is normal in size and structure.   Venous: The inferior vena cava is normal in size with  greater than 50%  respiratory variability, suggesting right atrial pressure of 3 mmHg.   IAS/Shunts: No atrial level shunt detected by color flow Doppler.    ZIO monitor The patient wore the monitor for 7  days 19  hours starting 08/13/2020. Indication: Palpitations  The minimum heart rate was 51  bpm, maximum heart rate was 114 bpm, and average heart rate was 67  bpm. Predominant underlying rhythm was Sinus Rhythm.   Premature atrial complexes were rare. Premature Ventricular complexes were rare.  No ventricular tachycardia, no pauses, No AV block, no supraventricular and no atrial fibrillation present. 5 patient triggered events and 5 diary events all associated with sinus rhythm.:   Conclusion: Normal /Unremarkable study, with no evidence of significant.     Recent Labs: 06/03/2020: ALT 29; TSH 1.863 08/13/2020: BUN 12; Creatinine, Ser 1.21; Hemoglobin 11.0; Magnesium 2.0; Platelets 387; Potassium 3.9; Sodium 141  Recent Lipid Panel No results found for: CHOL, TRIG, HDL, CHOLHDL, VLDL, LDLCALC, LDLDIRECT  Physical Exam:    VS:  BP 96/66   Pulse 72   Ht 4\' 11"  (1.499 m)   Wt 131 lb 9.6 oz (59.7 kg)   SpO2 93%   BMI 26.58 kg/m     Wt Readings from Last 3 Encounters:  09/17/20 131 lb 9.6 oz (59.7 kg)  08/13/20 130 lb (59 kg)     GEN: Well nourished, well developed in no acute distress HEENT: Normal NECK: No JVD; No carotid bruits LYMPHATICS: No lymphadenopathy CARDIAC: S1S2 noted,RRR, no murmurs, rubs, gallops RESPIRATORY:  Clear to auscultation without rales, wheezing or rhonchi  ABDOMEN: Soft, non-tender, non-distended, +bowel sounds, no guarding. EXTREMITIES: No edema, No cyanosis, no clubbing MUSCULOSKELETAL:  No deformity  SKIN: Warm and dry NEUROLOGIC:  Alert and oriented x 3, non-focal PSYCHIATRIC:  Normal affect, good insight  ASSESSMENT:    1. Coronary artery disease involving native coronary artery of native heart without angina pectoris   2.  Depressed left ventricular ejection fraction   3. Post operative atrial fibrillation (HCC)   4. Mixed hyperlipidemia    PLAN:     No angina symptoms she will stay on aspirin and statin.  She still needs to get a lipid profile to be done.  We will refer the patient today to cardiac rehab.  I think the patient still needs to undergo her cardiac rehab prior to going back to work.  I will be happy to complete her disability form giving her coronary artery disease, recent CABG and need for cardiac rehab.  Her son tells me he will drop this off in the end of February.  In terms of her ischemic cardiomyopathy her EF has improved slightly from 35-42 now 40 to 45%.  She is currently on metoprolol 25 mg daily  and is hypotensive what is going to do is cut back on her metoprolol to 12.5 mg daily.  She eventually needs Entresto but because she is hypotensive we will wait until we are able to get some blood pressure before starting this medication.  We will decrease her Lasix to 20 mg every other day.  Her postop ZIO monitor did not show any evidence of atrial fibrillation.  Keep her off of amiodarone I think her postop A. fib has resolved no need for any anticoagulation at this time.  The patient is in agreement with the above plan. The patient left the office in stable condition.  The patient will follow up in 4 weeks or sooner if needed.   Medication Adjustments/Labs and Tests Ordered: Current medicines are reviewed at length with the patient today.  Concerns regarding medicines are outlined above.  Orders Placed This Encounter  Procedures  . Basic metabolic panel  . Magnesium  . Lipid panel  . EKG 12-Lead   Meds ordered this encounter  Medications  . metoprolol succinate (TOPROL-XL) 25 MG 24 hr tablet    Sig: Take 0.5 tablets (12.5 mg total) by mouth daily. Take with or immediately following a meal.    Dispense:  90 tablet    Refill:  1  . furosemide (LASIX) 40 MG tablet    Sig: Take 0.5  tablets (20 mg total) by mouth every other day.    Dispense:  90 tablet    Refill:  1    Patient Instructions   Medication Instructions:  Your physician has recommended you make the following change in your medication:  DECREASE: Metoprolol 12.5 mg daily DECREASE: Lasix 20 mg every other day  *If you need a refill on your cardiac medications before your next appointment, please call your pharmacy*   Lab Work: Your physician recommends that you return for lab work: TODAY BMET, Mag, Lipids  If you have labs (blood work) drawn today and your tests are completely normal, you will receive your results only by: Marland Kitchen MyChart Message (if you have MyChart) OR . A paper copy in the mail If you have any lab test that is abnormal or we need to change your treatment, we will call you to review the results.   Testing/Procedures: None   Follow-Up: At [facility], you and your health needs are our priority.  As part of our continuing mission to provide you with exceptional heart care, we have created designated Provider Care Teams.  These Care Teams include your primary Cardiologist (physician) and Advanced Practice Providers (APPs -  Physician Assistants and Nurse Practitioners) who all work together to provide you with the care you need, when you need it.  We recommend signing up for the patient portal called "MyChart".  Sign up information is provided on this After Visit Summary.  MyChart is used to connect with patients for Virtual Visits (Telemedicine).  Patients are able to view lab/test results, encounter notes, upcoming appointments, etc.  Non-urgent messages can be sent to your provider as well.   To learn more about what you can do with MyChart, go to [email or web address].    Your next appointment:   4 week(s)  The format for your next appointment:   In Person  Provider:   Thomasene Ripple, DO   Other Instructions  Cardiac Rehabilitation What is cardiac  rehabilitation? Cardiac rehabilitation is a treatment program that helps improve the health and well-being of people who have heart problems. Cardiac rehabilitation includes exercise training,  education, and counseling to help you get stronger and return to an active lifestyle. This program can help you get better faster and reduce any future hospital stays. Why might I need cardiac rehabilitation? Cardiac rehabilitation programs can help when you have or have had:  A heart attack.  Heart failure.  Peripheral artery disease.  Coronary artery disease.  Angina.  Lung or breathing problems. Cardiac rehabilitation programs are also used when you have had:  Coronary artery bypass graft surgery.  Heart valve replacement.  Heart stent placement.  Heart transplant.  Aneurysm repair. What are the benefits of cardiac rehabilitation? Cardiac rehabilitation can help you:  Reduce problems like chest pain and trouble breathing.  Change risk factors that contribute to heart disease, such as: ? Smoking. ? High blood pressure. ? High cholesterol. ? Diabetes. ? Being inactive. ? Weighing over 30% more than your ideal weight. ? Diet.  Improve your emotional outlook so you feel: ? More hopeful. ? Better about yourself. ? More confident about taking care of yourself.  Get support from health experts as well as other people with similar problems.  Learn healthy ways to manage stress.  Learn how to manage and understand your medicines.  Teach your family about your condition and how to participate in your recovery. What happens in cardiac rehabilitation? You will be assessed by a cardiac rehabilitation team. They will check your health history and do a physical exam. You may need blood tests, exercise stress tests, and other evaluations to make sure that you are ready to start cardiac rehabilitation. The cardiac rehabilitation team works with you to make a plan based on your health  and goals. Your program will be tailored to fit you and your needs and may change as you progress. You may work with a health care team that includes:  Doctors.  Nurses.  Dietitians.  Psychologists.  Exercise specialists.  Physical and occupational therapists. What are the phases of cardiac rehabilitation? A cardiac rehabilitation program is often divided into phases. You advance from one phase to the next. Phase 1 This phase starts while you are still in the hospital. You may:  Start by walking in your room and then in the hall.  Do some simple exercises with a therapist.   Phase 2 This phase begins when you go home or to another facility. You will travel to a cardiac rehabilitation center or another place where rehabilitation is offered. This phase may last 8-12 weeks. During this phase:  You will slowly increase your activity level while being closely watched by a nurse or therapist.  You will have medical tests and exams to monitor your progress.  Your exercises may include strength or resistance training along with activities that cause your heart to beat faster (aerobic exercises), such as walking on a treadmill.  Your condition will determine how often and how long these sessions last.  You may learn how to: ? Adriana Simas heart-healthy meals. ? Control your blood sugar, if this applies. ? Stop smoking. ? Manage your medicines. You may need help with scheduling or planning how and when to take your medicines. If you have questions about your medicines, it is very important that you talk with your health care provider.   Phase 3 This phase continues for the rest of your life. In this phase:  There will be less supervision.  You may continue to participate in cardiac rehabilitation activities or become part of a group in your community.  You may benefit from  talking about your experience with other people who are facing similar challenges. Follow these instructions at  home:  Take over-the-counter and prescription medicines only as told by your health care provider.  Keep all follow-up visits as told by your health care provider. This is important. Get help right away if:  You have severe chest discomfort, especially if the pain is crushing or pressure-like and spreads to your arms, back, neck, or jaw. Do not wait to see if the pain will go away.  You have weakness or numbness in your face, arms, or legs, especially on one side of the body.  Your speech is slurred.  You are confused.  You have a sudden, severe headache or loss of vision.  You have shortness of breath.  You are sweating and have nausea.  You feel dizzy or faint.  You are fatigued. These symptoms may represent a serious problem that is an emergency. Do not wait to see if the symptoms will go away. Get medical help right away. Call your local emergency services (911 in the [place] Do not drive yourself to the hospital. Summary  Cardiac rehabilitation is a treatment program that helps improve the health and well-being of people who have heart problems.  A cardiac rehabilitation program is often divided into phases. You advance from one phase to the next.  The cardiac rehabilitation team works with you to make a plan based on your health and goals.  Cardiac rehabilitation includes exercise training, education, and counseling to help you get stronger and return to an active lifestyle. This information is not intended to replace advice given to you by your health care provider. Make sure you discuss any questions you have with your health care provider. Document Revised: 11/13/2018 Document Reviewed: 05/23/2018 Elsevier Patient Education  2021 Elsevier Inc.      Adopting a Healthy Lifestyle.  Know what a healthy weight is for you (roughly BMI <25) and aim to maintain this   Aim for 7+ servings of fruits and vegetables daily   65-80+ fluid ounces of water or unsweet tea for  healthy kidneys   Limit to max 1 drink of alcohol per day; avoid smoking/tobacco   Limit animal fats in diet for cholesterol and heart health - choose grass fed whenever available   Avoid highly processed foods, and foods high in saturated/trans fats   Aim for low stress - take time to unwind and care for your mental health   Aim for 150 min of moderate intensity exercise weekly for heart health, and weights twice weekly for bone health   Aim for 7-9 hours of sleep daily   When it comes to diets, agreement about the perfect plan isnt easy to find, even among the experts. Experts at the [facility] [employer] developed an idea known as the Healthy Eating Plate. Just imagine a plate divided into logical, healthy portions.   The emphasis is on diet quality:   Load up on vegetables and fruits - one-half of your plate: Aim for color and variety, and remember that potatoes dont count.   Go for whole grains - one-quarter of your plate: Whole wheat, barley, wheat berries, quinoa, oats, brown rice, and foods made with them. If you want pasta, go with whole wheat pasta.   Protein power - one-quarter of your plate: Fish, chicken, beans, and nuts are all healthy, versatile protein sources. Limit red meat.   The diet, however, does go beyond the plate, offering a few other  suggestions.   Use healthy plant oils, such as olive, canola, soy, corn, sunflower and peanut. Check the labels, and avoid partially hydrogenated oil, which have unhealthy trans fats.   If youre thirsty, drink water. Coffee and tea are good in moderation, but skip sugary drinks and limit milk and dairy products to one or two daily servings.   The type of carbohydrate in the diet is more important than the amount. Some sources of carbohydrates, such as vegetables, fruits, whole grains, and beans-are healthier than others.   Finally, stay active  Signed, Thomasene Ripple, DO  09/17/2020 2:54 PM    [facility]

## 2020-09-17 NOTE — Patient Instructions (Signed)
Medication Instructions:  Your physician has recommended you make the following change in your medication:  DECREASE: Metoprolol 12.5 mg daily DECREASE: Lasix 20 mg every other day  *If you need a refill on your cardiac medications before your next appointment, please call your pharmacy*   Lab Work: Your physician recommends that you return for lab work: TODAY BMET, Mag, Lipids  If you have labs (blood work) drawn today and your tests are completely normal, you will receive your results only by: Marland Kitchen MyChart Message (if you have MyChart) OR . A paper copy in the mail If you have any lab test that is abnormal or we need to change your treatment, we will call you to review the results.   Testing/Procedures: None   Follow-Up: At [facility], you and your health needs are our priority.  As part of our continuing mission to provide you with exceptional heart care, we have created designated Provider Care Teams.  These Care Teams include your primary Cardiologist (physician) and Advanced Practice Providers (APPs -  Physician Assistants and Nurse Practitioners) who all work together to provide you with the care you need, when you need it.  We recommend signing up for the patient portal called "MyChart".  Sign up information is provided on this After Visit Summary.  MyChart is used to connect with patients for Virtual Visits (Telemedicine).  Patients are able to view lab/test results, encounter notes, upcoming appointments, etc.  Non-urgent messages can be sent to your provider as well.   To learn more about what you can do with MyChart, go to [email or web address].    Your next appointment:   4 week(s)  The format for your next appointment:   In Person  Provider:   Thomasene Ripple, DO   Other Instructions  Cardiac Rehabilitation What is cardiac rehabilitation? Cardiac rehabilitation is a treatment program that helps improve the health and well-being of people who have heart problems.  Cardiac rehabilitation includes exercise training, education, and counseling to help you get stronger and return to an active lifestyle. This program can help you get better faster and reduce any future hospital stays. Why might I need cardiac rehabilitation? Cardiac rehabilitation programs can help when you have or have had:  A heart attack.  Heart failure.  Peripheral artery disease.  Coronary artery disease.  Angina.  Lung or breathing problems. Cardiac rehabilitation programs are also used when you have had:  Coronary artery bypass graft surgery.  Heart valve replacement.  Heart stent placement.  Heart transplant.  Aneurysm repair. What are the benefits of cardiac rehabilitation? Cardiac rehabilitation can help you:  Reduce problems like chest pain and trouble breathing.  Change risk factors that contribute to heart disease, such as: ? Smoking. ? High blood pressure. ? High cholesterol. ? Diabetes. ? Being inactive. ? Weighing over 30% more than your ideal weight. ? Diet.  Improve your emotional outlook so you feel: ? More hopeful. ? Better about yourself. ? More confident about taking care of yourself.  Get support from health experts as well as other people with similar problems.  Learn healthy ways to manage stress.  Learn how to manage and understand your medicines.  Teach your family about your condition and how to participate in your recovery. What happens in cardiac rehabilitation? You will be assessed by a cardiac rehabilitation team. They will check your health history and do a physical exam. You may need blood tests, exercise stress tests, and other evaluations to make sure that you are  ready to start cardiac rehabilitation. The cardiac rehabilitation team works with you to make a plan based on your health and goals. Your program will be tailored to fit you and your needs and may change as you progress. You may work with a health care team that  includes:  Doctors.  Nurses.  Dietitians.  Psychologists.  Exercise specialists.  Physical and occupational therapists. What are the phases of cardiac rehabilitation? A cardiac rehabilitation program is often divided into phases. You advance from one phase to the next. Phase 1 This phase starts while you are still in the hospital. You may:  Start by walking in your room and then in the hall.  Do some simple exercises with a therapist.   Phase 2 This phase begins when you go home or to another facility. You will travel to a cardiac rehabilitation center or another place where rehabilitation is offered. This phase may last 8-12 weeks. During this phase:  You will slowly increase your activity level while being closely watched by a nurse or therapist.  You will have medical tests and exams to monitor your progress.  Your exercises may include strength or resistance training along with activities that cause your heart to beat faster (aerobic exercises), such as walking on a treadmill.  Your condition will determine how often and how long these sessions last.  You may learn how to: ? Adriana Simas heart-healthy meals. ? Control your blood sugar, if this applies. ? Stop smoking. ? Manage your medicines. You may need help with scheduling or planning how and when to take your medicines. If you have questions about your medicines, it is very important that you talk with your health care provider.   Phase 3 This phase continues for the rest of your life. In this phase:  There will be less supervision.  You may continue to participate in cardiac rehabilitation activities or become part of a group in your community.  You may benefit from talking about your experience with other people who are facing similar challenges. Follow these instructions at home:  Take over-the-counter and prescription medicines only as told by your health care provider.  Keep all follow-up visits as told by your  health care provider. This is important. Get help right away if:  You have severe chest discomfort, especially if the pain is crushing or pressure-like and spreads to your arms, back, neck, or jaw. Do not wait to see if the pain will go away.  You have weakness or numbness in your face, arms, or legs, especially on one side of the body.  Your speech is slurred.  You are confused.  You have a sudden, severe headache or loss of vision.  You have shortness of breath.  You are sweating and have nausea.  You feel dizzy or faint.  You are fatigued. These symptoms may represent a serious problem that is an emergency. Do not wait to see if the symptoms will go away. Get medical help right away. Call your local emergency services (911 in the [place] Do not drive yourself to the hospital. Summary  Cardiac rehabilitation is a treatment program that helps improve the health and well-being of people who have heart problems.  A cardiac rehabilitation program is often divided into phases. You advance from one phase to the next.  The cardiac rehabilitation team works with you to make a plan based on your health and goals.  Cardiac rehabilitation includes exercise training, education, and counseling to help you get stronger and  return to an active lifestyle. This information is not intended to replace advice given to you by your health care provider. Make sure you discuss any questions you have with your health care provider. Document Revised: 11/13/2018 Document Reviewed: 05/23/2018 Elsevier Patient Education  2021 [employer].

## 2020-09-18 LAB — MAGNESIUM: Magnesium: 1.9 mg/dL (ref 1.6–2.3)

## 2020-09-18 LAB — BASIC METABOLIC PANEL
BUN/Creatinine Ratio: 12 (ref 12–28)
BUN: 12 mg/dL (ref 8–27)
CO2: 25 mmol/L (ref 20–29)
Calcium: 9.6 mg/dL (ref 8.7–10.3)
Chloride: 101 mmol/L (ref 96–106)
Creatinine, Ser: 0.99 mg/dL (ref 0.57–1.00)
GFR calc Af Amer: 72 mL/min/{1.73_m2} (ref >59)
GFR calc non Af Amer: 62 mL/min/{1.73_m2} (ref >59)
Glucose: 88 mg/dL (ref 65–99)
Potassium: 4.2 mmol/L (ref 3.5–5.2)
Sodium: 141 mmol/L (ref 134–144)

## 2020-09-18 LAB — LIPID PANEL
Chol/HDL Ratio: 5 ratio — ABNORMAL HIGH (ref 0.0–4.4)
Cholesterol, Total: 232 mg/dL — ABNORMAL HIGH (ref 100–199)
HDL: 46 mg/dL (ref >39)
LDL Chol Calc (NIH): 157 mg/dL — ABNORMAL HIGH (ref 0–99)
Triglycerides: 161 mg/dL — ABNORMAL HIGH (ref 0–149)
VLDL Cholesterol Cal: 29 mg/dL (ref 5–40)

## 2020-09-21 ENCOUNTER — Other Ambulatory Visit: Payer: Self-pay

## 2020-09-21 MED ORDER — EZETIMIBE 10 MG PO TABS: 10.0000 mg | ORAL_TABLET | Freq: Every day | ORAL | 3 refills | Status: DC

## 2020-09-21 MED ORDER — ATORVASTATIN CALCIUM 40 MG PO TABS: 40.0000 mg | ORAL_TABLET | Freq: Every day | ORAL | 3 refills | Status: DC

## 2020-09-21 NOTE — Progress Notes (Signed)
Spoke with patient about her results. Prescription sent to pharmacy. Patient verbalizes understanding. No questions or concerns at this time.

## 2020-09-30 ENCOUNTER — Telehealth: Payer: Self-pay | Admitting: Cardiology

## 2020-09-30 DIAGNOSIS — Z0279 Encounter for issue of other medical certificate: Secondary | ICD-10-CM

## 2020-09-30 NOTE — Telephone Encounter (Signed)
09/30/20 patient dropped off STD FORMS. $29.00 CASH payment has been placed in safe. Scanned in chart. Placed in Dr. Mallory Shirk box for completion.Noreene Filbert

## 2020-10-07 ENCOUNTER — Encounter: Payer: Self-pay | Admitting: Cardiology

## 2020-10-07 NOTE — Telephone Encounter (Signed)
error 

## 2020-10-15 ENCOUNTER — Encounter: Payer: Self-pay | Admitting: Cardiology

## 2020-10-15 ENCOUNTER — Ambulatory Visit (INDEPENDENT_AMBULATORY_CARE_PROVIDER_SITE_OTHER): Payer: 59 | Admitting: Cardiology

## 2020-10-15 ENCOUNTER — Other Ambulatory Visit: Payer: Self-pay

## 2020-10-15 VITALS — BP 120/64 | HR 73 | Ht 59.0 in | Wt 136.0 lb

## 2020-10-15 DIAGNOSIS — I10 Essential (primary) hypertension: Secondary | ICD-10-CM

## 2020-10-15 DIAGNOSIS — I255 Ischemic cardiomyopathy: Secondary | ICD-10-CM | POA: Diagnosis not present

## 2020-10-15 DIAGNOSIS — E782 Mixed hyperlipidemia: Secondary | ICD-10-CM

## 2020-10-15 DIAGNOSIS — I5020 Unspecified systolic (congestive) heart failure: Secondary | ICD-10-CM | POA: Diagnosis not present

## 2020-10-15 DIAGNOSIS — I2510 Atherosclerotic heart disease of native coronary artery without angina pectoris: Secondary | ICD-10-CM | POA: Diagnosis not present

## 2020-10-15 NOTE — Progress Notes (Signed)
Cardiology Office Note:    Date:  10/15/2020   ID:  Teresa [name], DOB [birth year], MRN [id number]  PCP:  Carron Curie, MD  Cardiologist:  Thomasene Ripple, DO  Electrophysiologist:  None   Referring MD: Carron Curie, MD   I feel tired  History of Present Illness:    Teresa [name] is a 60 y.o. female with a hx of  of coronary artery disease status post CABG x4(SVG to PDA, SVG to first obtuse marginal, SVG to diagonal and LIMA to LAD) on April 25, 2020, ischemic cardiomyopathy EF in September 2021 with severely depressed EF 25 to 30%, however in October 2021 her EF improved slightly to 35 to 40%, hypertension, hyperlipidemia, smoker, prolonged hospitalization from September overall 90 days,respiratory failure was on mechanical ventilation then status post tracheostomy.  I saw the patient in January 2022 at that time she was just back home from rehab.  We continue her medications which include her aspirin as well as her Lipitor.  We also repeated lab work for lipid profile.  During our visit she was still on amiodarone for her postop atrial fibrillation.   I saw the patient in September 17, 2020 at that time.  Refer the patient to cardiac rehab.  She was also hypotensive so I cut back on her metoprolol to 12.5 and decrease her Lasix to every other day.  She is doing well today.  She tells me she is tired but has been able to tolerate rehab.  Past Medical History:  Diagnosis Date  . Acute on chronic respiratory failure with hypoxia (HCC)   . Acute renal failure due to tubular necrosis (HCC)   . Atrial flutter (HCC)   . Cardiogenic shock (HCC)   . Coronary artery disease involving native coronary artery of native heart   . Depression, unspecified 10/09/2015  . Essential hypertension 10/06/2015  . Gastro-esophageal reflux disease without esophagitis 10/06/2015  . History of tonic-clonic seizures 04/25/2020  . Hyperlipidemia 10/06/2015  . Seizure disorder (HCC) 03/03/2016  . Shortness of breath  04/25/2020  . Syncope 04/25/2020    Past Surgical History:  Procedure Laterality Date  . AORTIC VALVE REPLACEMENT (AVR)/CORONARY ARTERY BYPASS GRAFTING (CABG)  04/26/2020   X4  . APPENDECTOMY    . TONSILLECTOMY    . TOTAL ABDOMINAL HYSTERECTOMY    . TOTAL HIP ARTHROPLASTY      Current Medications: Current Meds  Medication Sig  . albuterol (VENTOLIN HFA) 108 (90 Base) MCG/ACT inhaler 8.5 g.  . aspirin 81 MG chewable tablet Chew 1 tablet by mouth daily.  Marland Kitchen atorvastatin (LIPITOR) 40 MG tablet Take 1 tablet (40 mg total) by mouth daily.  Marland Kitchen b complex-vitamin c-folic acid (NEPHRO-VITE) 0.8 MG TABS tablet 1 tablet by Per OG tube route daily.  . citalopram (CELEXA) 20 MG tablet Take 20 mg by mouth daily.  . ergocalciferol (VITAMIN D2) 1.25 MG (50000 UT) capsule Take by mouth.  . ezetimibe (ZETIA) 10 MG tablet Take 1 tablet (10 mg total) by mouth daily.  . furosemide (LASIX) 40 MG tablet Take 0.5 tablets (20 mg total) by mouth every other day.  . levETIRAcetam (KEPPRA XR) 500 MG 24 hr tablet Take 1 tablet by mouth daily.  . metoprolol succinate (TOPROL-XL) 25 MG 24 hr tablet Take 0.5 tablets (12.5 mg total) by mouth daily. Take with or immediately following a meal.  . pantoprazole (PROTONIX) 40 MG tablet Take 40 mg by mouth daily.  . Promethazine HCl 6.25 MG/5ML SOLN Take 5 mLs  by mouth 4 (four) times daily.     Allergies:   Patient has no known allergies.   Social History   Socioeconomic History  . Marital status: Married    Spouse name: Not on file  . Number of children: Not on file  . Years of education: Not on file  . Highest education level: Not on file  Occupational History  . Not on file  Tobacco Use  . Smoking status: Former Smoker    Packs/day: 1.50    Types: Cigarettes    Quit date: 04/07/2020    Years since quitting: 0.5  . Smokeless tobacco: Never Used  Substance and Sexual Activity  . Alcohol use: Never  . Drug use: Never  . Sexual activity: Not on file  Other  Topics Concern  . Not on file  Social History Narrative  . Not on file   Social Determinants of Health   Financial Resource Strain: Not on file  Food Insecurity: Not on file  Transportation Needs: Not on file  Physical Activity: Not on file  Stress: Not on file  Social Connections: Not on file     Family History: The patient's family history includes Diabetes in her paternal grandmother; Diabetes Mellitus I in her sister; Hypertension in her brother, father, mother, and sister.  ROS:   Review of Systems  Constitution: Negative for decreased appetite, fever and weight gain.  HENT: Negative for congestion, ear discharge, hoarse voice and sore throat.   Eyes: Negative for discharge, redness, vision loss in right eye and visual halos.  Cardiovascular: Negative for chest pain, dyspnea on exertion, leg swelling, orthopnea and palpitations.  Respiratory: Negative for cough, hemoptysis, shortness of breath and snoring.   Endocrine: Negative for heat intolerance and polyphagia.  Hematologic/Lymphatic: Negative for bleeding problem. Does not bruise/bleed easily.  Skin: Negative for flushing, nail changes, rash and suspicious lesions.  Musculoskeletal: Negative for arthritis, joint pain, muscle cramps, myalgias, neck pain and stiffness.  Gastrointestinal: Negative for abdominal pain, bowel incontinence, diarrhea and excessive appetite.  Genitourinary: Negative for decreased libido, genital sores and incomplete emptying.  Neurological: Negative for brief paralysis, focal weakness, headaches and loss of balance.  Psychiatric/Behavioral: Negative for altered mental status, depression and suicidal ideas.  Allergic/Immunologic: Negative for HIV exposure and persistent infections.    EKGs/Labs/Other Studies Reviewed:    The following studies were reviewed today:   EKG: None today  Transthoracic  Echocardiogram IMPRESSIONS September 10, 2020 1. Left ventricular ejection fraction, by  estimation, is 40 to 45%. The left ventricle has mildly decreased function. The left ventricle has no regional wall motion abnormalities. The left ventricular internal cavity size was mildly dilated. Left  ventricular diastolic parameters were normal.  2. Right ventricular systolic function is normal. The right ventricular size is normal. There is normal pulmonary artery systolic pressure.  3. The mitral valve is normal in structure. No evidence of mitral valve regurgitation. No evidence of mitral stenosis.  4. The aortic valve is normal in structure. Aortic valve regurgitation is mild. No aortic stenosis is present.  5. The inferior vena cava is normal in size with greater than 50% respiratory variability, suggesting right atrial pressure of 3 mmHg.   FINDINGS  Left Ventricle: Left ventricular ejection fraction, by estimation, is 40 to 45%. The left ventricle has mildly decreased function. The left ventricle has no regional wall motion abnormalities. The left ventricular internal cavity size was mildly  dilated. There is no left ventricular hypertrophy. Left ventricular diastolic parameters were  normal.     LV Wall Scoring:  The mid and distal inferior wall is hypokinetic.   Right Ventricle: The right ventricular size is normal. No increase in  right ventricular wall thickness. Right ventricular systolic function is  normal. There is normal pulmonary artery systolic pressure. The tricuspid  regurgitant velocity is 1.91 m/s, and  with an assumed right atrial pressure of 3 mmHg, the estimated right  ventricular systolic pressure is 17.6 mmHg.   Left Atrium: Left atrial size was normal in size.   Right Atrium: Right atrial size was normal in size.   Pericardium: There is no evidence of pericardial effusion.   Mitral Valve: The mitral valve is normal in structure. No evidence of  mitral valve regurgitation. No evidence of mitral valve stenosis.   Tricuspid Valve: The tricuspid  valve is normal in structure. Tricuspid  valve regurgitation is trivial. No evidence of tricuspid stenosis.   Aortic Valve: The aortic valve is normal in structure. Aortic valve  regurgitation is mild. Aortic regurgitation PHT measures 586 msec. No  aortic stenosis is present.   Pulmonic Valve: The pulmonic valve was normal in structure. Pulmonic valve  regurgitation is not visualized. No evidence of pulmonic stenosis.   Aorta: The aortic root is normal in size and structure.   Venous: The inferior vena cava is normal in size with greater than 50%  respiratory variability, suggesting right atrial pressure of 3 mmHg.   IAS/Shunts: No atrial level shunt detected by color flow Doppler.    ZIO monitor The patient wore the monitor for 7 days 19 hours starting 08/13/2020. Indication: Palpitations  The minimum heart rate was 51 bpm, maximum heart rate was 114 bpm, and average heart rate was 67 bpm. Predominant underlying rhythm was Sinus Rhythm.   Premature atrial complexes were rare. Premature Ventricular complexes were rare.  No ventricular tachycardia, no pauses, No AV block, no supraventricular and no atrial fibrillation present. 5 patient triggered events and 5 diary events all associated with sinus rhythm.:   Conclusion: Normal /Unremarkable study, with no evidence of significant.   Recent Labs: 06/03/2020: ALT 29; TSH 1.863 08/13/2020: Hemoglobin 11.0; Platelets 387 09/17/2020: BUN 12; Creatinine, Ser 0.99; Magnesium 1.9; Potassium 4.2; Sodium 141  Recent Lipid Panel    Component Value Date/Time   CHOL 232 (H) 09/17/2020 1446   TRIG 161 (H) 09/17/2020 1446   HDL 46 09/17/2020 1446   CHOLHDL 5.0 (H) 09/17/2020 1446   LDLCALC 157 (H) 09/17/2020 1446    Physical Exam:    VS:  BP 120/64 (BP Location: Right Arm, Patient Position: Sitting, Cuff Size: Normal)   Pulse 73   Ht 4\' 11"  (1.499 m)   Wt 136 lb (61.7 kg)   SpO2 96%   BMI 27.47 kg/m     Wt Readings  from Last 3 Encounters:  10/15/20 136 lb (61.7 kg)  09/17/20 131 lb 9.6 oz (59.7 kg)  08/13/20 130 lb (59 kg)     GEN: Well nourished, well developed in no acute distress HEENT: Normal NECK: No JVD; No carotid bruits LYMPHATICS: No lymphadenopathy CARDIAC: S1S2 noted,RRR, no murmurs, rubs, gallops RESPIRATORY:  Clear to auscultation without rales, wheezing or rhonchi  ABDOMEN: Soft, non-tender, non-distended, +bowel sounds, no guarding. EXTREMITIES: No edema, No cyanosis, no clubbing MUSCULOSKELETAL:  No deformity  SKIN: Warm and dry NEUROLOGIC:  Alert and oriented x 3, non-focal PSYCHIATRIC:  Normal affect, good insight  ASSESSMENT:    1. Coronary artery disease involving native coronary artery of  native heart, unspecified whether angina present   2. Essential hypertension   3. Ischemic cardiomyopathy   4. HFrEF (heart failure with reduced ejection fraction) (HCC)   5. Mixed hyperlipidemia    PLAN:     Her blood pressure has improved since cutting back on her Lasix and her Toprol.  We will continue to monitor.  She tells me she feels fatigued but is able to tolerate her cardiac rehab.  The patient will require cardiac rehab for 3 months hopefully after that she is able to return to her daily activities.  No angina symptoms continue aspirin 81 mg daily and atorvastatin 40 mg a day with Zetia 10 mg therapy  She is euvolemic we will continue patient on her 20 mg Lasix every other day. The patient is in agreement with the above plan. The patient left the office in stable condition.  The patient will follow up in 3 months or sooner if needed.   Medication Adjustments/Labs and Tests Ordered: Current medicines are reviewed at length with the patient today.  Concerns regarding medicines are outlined above.  No orders of the defined types were placed in this encounter.  No orders of the defined types were placed in this encounter.   Patient Instructions  Medication Instructions:   Your physician recommends that you continue on your current medications as directed. Please refer to the Current Medication list given to you today.  *If you need a refill on your cardiac medications before your next appointment, please call your pharmacy*   Lab Work: None If you have labs (blood work) drawn today and your tests are completely normal, you will receive your results only by: Marland Kitchen MyChart Message (if you have MyChart) OR . A paper copy in the mail If you have any lab test that is abnormal or we need to change your treatment, we will call you to review the results.   Testing/Procedures: None   Follow-Up: At [facility], you and your health needs are our priority.  As part of our continuing mission to provide you with exceptional heart care, we have created designated Provider Care Teams.  These Care Teams include your primary Cardiologist (physician) and Advanced Practice Providers (APPs -  Physician Assistants and Nurse Practitioners) who all work together to provide you with the care you need, when you need it.  We recommend signing up for the patient portal called "MyChart".  Sign up information is provided on this After Visit Summary.  MyChart is used to connect with patients for Virtual Visits (Telemedicine).  Patients are able to view lab/test results, encounter notes, upcoming appointments, etc.  Non-urgent messages can be sent to your provider as well.   To learn more about what you can do with MyChart, go to [email or web address].    Your next appointment:   3 month(s)  The format for your next appointment:   In Person  Provider:   Thomasene Ripple, DO   Other Instructions      Adopting a Healthy Lifestyle.  Know what a healthy weight is for you (roughly BMI <25) and aim to maintain this   Aim for 7+ servings of fruits and vegetables daily   65-80+ fluid ounces of water or unsweet tea for healthy kidneys   Limit to max 1 drink of alcohol per day;  avoid smoking/tobacco   Limit animal fats in diet for cholesterol and heart health - choose grass fed whenever available   Avoid highly processed foods, and foods high in saturated/trans  fats   Aim for low stress - take time to unwind and care for your mental health   Aim for 150 min of moderate intensity exercise weekly for heart health, and weights twice weekly for bone health   Aim for 7-9 hours of sleep daily   When it comes to diets, agreement about the perfect plan isnt easy to find, even among the experts. Experts at the [facility] [employer] developed an idea known as the Healthy Eating Plate. Just imagine a plate divided into logical, healthy portions.   The emphasis is on diet quality:   Load up on vegetables and fruits - one-half of your plate: Aim for color and variety, and remember that potatoes dont count.   Go for whole grains - one-quarter of your plate: Whole wheat, barley, wheat berries, quinoa, oats, brown rice, and foods made with them. If you want pasta, go with whole wheat pasta.   Protein power - one-quarter of your plate: Fish, chicken, beans, and nuts are all healthy, versatile protein sources. Limit red meat.   The diet, however, does go beyond the plate, offering a few other suggestions.   Use healthy plant oils, such as olive, canola, soy, corn, sunflower and peanut. Check the labels, and avoid partially hydrogenated oil, which have unhealthy trans fats.   If youre thirsty, drink water. Coffee and tea are good in moderation, but skip sugary drinks and limit milk and dairy products to one or two daily servings.   The type of carbohydrate in the diet is more important than the amount. Some sources of carbohydrates, such as vegetables, fruits, whole grains, and beans-are healthier than others.   Finally, stay active  Signed, Thomasene Ripple, DO  10/15/2020 11:18 AM     [facility]

## 2020-10-15 NOTE — Patient Instructions (Signed)

## 2020-10-22 ENCOUNTER — Telehealth: Payer: Self-pay | Admitting: Cardiology

## 2020-10-22 NOTE — Telephone Encounter (Signed)
    Pt is calling to f/u the paperworks she gave to Dr. Servando Salina on her last visit. She said it will be ready on the 17th. She wanted to know if its ready for her to pick It up

## 2020-10-27 NOTE — Telephone Encounter (Signed)
Spoke with patient to let her know her paperwork is ready at the front daesk to be picked up.

## 2020-10-27 NOTE — Telephone Encounter (Signed)
FYI--Patient is following up. She states she is able to come by the office for the paperwork on 10/29/20.

## 2020-12-08 ENCOUNTER — Other Ambulatory Visit: Payer: Self-pay

## 2020-12-08 ENCOUNTER — Ambulatory Visit: Payer: 59 | Admitting: Cardiology

## 2020-12-08 ENCOUNTER — Ambulatory Visit: Payer: 59

## 2020-12-08 ENCOUNTER — Encounter: Payer: Self-pay | Admitting: Cardiology

## 2020-12-08 ENCOUNTER — Ambulatory Visit (INDEPENDENT_AMBULATORY_CARE_PROVIDER_SITE_OTHER): Payer: 59 | Admitting: Cardiology

## 2020-12-08 VITALS — BP 110/72 | Ht 59.0 in | Wt 141.0 lb

## 2020-12-08 DIAGNOSIS — I2510 Atherosclerotic heart disease of native coronary artery without angina pectoris: Secondary | ICD-10-CM

## 2020-12-08 DIAGNOSIS — R42 Dizziness and giddiness: Secondary | ICD-10-CM | POA: Diagnosis not present

## 2020-12-08 DIAGNOSIS — I10 Essential (primary) hypertension: Secondary | ICD-10-CM | POA: Diagnosis not present

## 2020-12-08 DIAGNOSIS — R0989 Other specified symptoms and signs involving the circulatory and respiratory systems: Secondary | ICD-10-CM

## 2020-12-08 DIAGNOSIS — I4819 Other persistent atrial fibrillation: Secondary | ICD-10-CM

## 2020-12-08 DIAGNOSIS — Z720 Tobacco use: Secondary | ICD-10-CM

## 2020-12-08 DIAGNOSIS — E782 Mixed hyperlipidemia: Secondary | ICD-10-CM

## 2020-12-08 NOTE — Patient Instructions (Addendum)
Medication Instructions:  Your physician recommends that you continue on your current medications as directed. Please refer to the Current Medication list given to you today.  *If you need a refill on your cardiac medications before your next appointment, please call your pharmacy*   Lab Work: Your physician recommends that you return for lab work: TODAY: BMET, Mag, CBC If you have labs (blood work) drawn today and your tests are completely normal, you will receive your results only by: Marland Kitchen MyChart Message (if you have MyChart) OR . A paper copy in the mail If you have any lab test that is abnormal or we need to change your treatment, we will call you to review the results.   Testing/Procedures: A zio monitor was ordered today. It will remain on for 14 days. You will then return monitor and event diary in provided box. It takes 1-2 weeks for report to be downloaded and returned to [place]. We will call you with the results. If monitor falls off or has orange flashing light, please call Zio for further instructions.      Follow-Up: At [facility], you and your health needs are our priority.  As part of our continuing mission to provide you with exceptional heart care, we have created designated Provider Care Teams.  These Care Teams include your primary Cardiologist (physician) and Advanced Practice Providers (APPs -  Physician Assistants and Nurse Practitioners) who all work together to provide you with the care you need, when you need it.  We recommend signing up for the patient portal called "MyChart".  Sign up information is provided on this After Visit Summary.  MyChart is used to connect with patients for Virtual Visits (Telemedicine).  Patients are able to view lab/test results, encounter notes, upcoming appointments, etc.  Non-urgent messages can be sent to your provider as well.   To learn more about what you can do with MyChart, go to [email or web address].    Your next appointment:    3 month(s)  The format for your next appointment:   In Person ZIO  WHY IS MY DOCTOR PRESCRIBING ZIO? The Zio system is proven and trusted by physicians to detect and diagnose irregular heart rhythms -- and has been prescribed to hundreds of thousands of patients.  The FDA has cleared the Zio system to monitor for many different kinds of irregular heart rhythms. In a study, physicians were able to reach a diagnosis 90% of the time with the Zio system1.  You can wear the Zio monitor -- a small, discreet, comfortable patch -- during your normal day-to-day activity, including while you sleep, shower, and exercise, while it records every single heartbeat for analysis.  1Barrett, P., et al. Comparison of 24 Hour Holter Monitoring Versus 14 Day Novel Adhesive Patch Electrocardiographic Monitoring. American Journal of Medicine, 2014.  ZIO VS. HOLTER MONITORING The Zio monitor can be comfortably worn for up to 14 days. Holter monitors can be worn for 24 to 48 hours, limiting the time to record any irregular heart rhythms you may have. Zio is able to capture data for the 51% of patients who have their first symptom-triggered arrhythmia after 48 hours.1  LIVE WITHOUT RESTRICTIONS The Zio ambulatory cardiac monitor is a small, unobtrusive, and water-resistant patch--you might even forget you're wearing it. The Zio monitor records and stores every beat of your heart, whether you're sleeping, working out, or showering. Remove on: May 18th, 2022  Provider:   Thomasene Ripple, DO   Other Instructions

## 2020-12-08 NOTE — Progress Notes (Signed)
Cardiology Office Note:    Date:  12/10/2020   ID:  Teresa [name], DOB October 12, 1960, MRN [id number]  PCP:  Carron Curie, MD  Cardiologist:  Thomasene Ripple, DO  Electrophysiologist:  None   Referring MD: Carron Curie, MD   Chief Complaint  Patient presents with  . Edema    Lightheaded     History of Present Illness:    Teresa [name] is a 60 y.o. female with a hx of coronary artery disease status post CABG x4(SVG to PDA, SVG to first obtuse marginal, SVG to diagonal and LIMA to LAD) on April 25, 2020, ischemic cardiomyopathy EF in September 2021 with severely depressed EF 25 to 30%, however in October 2021 her EF improved slightly to 35 to 40%, hypertension, hyperlipidemia, smoker, prolonged hospitalization from September overall 90 days,respiratory failure was on mechanical ventilation then status post tracheostomy.  I saw the patient in January 2022 at that time she was just back home from rehab. We continue her medications which include her aspirin as well as her Lipitor. We also repeated lab work for lipid profile. During our visit she was still on amiodarone for her postop atrial fibrillation.   I saw the patient in September 17, 2020 at that time.  Refer the patient to cardiac rehab.  She was also hypotensive so I cut back on her metoprolol to 12.5 and decrease her Lasix to every other day.  She is doing well today.  She tells me she is tired but has been able to tolerate rehab.  I saw the patient on October 15, 2020 at that time her blood pressure had improved since cutting back on her Lasix and Toprol-XL.  She had not been able to do cardiac rehab due to fatigue.  Is here today for follow-up visit.  Past Medical History:  Diagnosis Date  . Acute on chronic respiratory failure with hypoxia (HCC)   . Acute renal failure due to tubular necrosis (HCC)   . Atrial flutter (HCC)   . Cardiogenic shock (HCC)   . Coronary artery disease involving native coronary artery of native heart    . Depression, unspecified 10/09/2015  . Essential hypertension 10/06/2015  . Gastro-esophageal reflux disease without esophagitis 10/06/2015  . History of tonic-clonic seizures 04/25/2020  . Hyperlipidemia 10/06/2015  . Seizure disorder (HCC) 03/03/2016  . Shortness of breath 04/25/2020  . Syncope 04/25/2020    Past Surgical History:  Procedure Laterality Date  . AORTIC VALVE REPLACEMENT (AVR)/CORONARY ARTERY BYPASS GRAFTING (CABG)  04/26/2020   X4  . APPENDECTOMY    . TONSILLECTOMY    . TOTAL ABDOMINAL HYSTERECTOMY    . TOTAL HIP ARTHROPLASTY      Current Medications: Current Meds  Medication Sig  . albuterol (VENTOLIN HFA) 108 (90 Base) MCG/ACT inhaler Inhale 1 puff into the lungs every 6 (six) hours as needed for wheezing or shortness of breath.  Marland Kitchen aspirin 81 MG chewable tablet Chew 1 tablet by mouth daily.  Marland Kitchen atorvastatin (LIPITOR) 40 MG tablet Take 1 tablet (40 mg total) by mouth daily.  . cholecalciferol (VITAMIN D3) 25 MCG (1000 UNIT) tablet Take 1,000 Units by mouth daily.  . citalopram (CELEXA) 20 MG tablet Take 20 mg by mouth daily.  Marland Kitchen ezetimibe (ZETIA) 10 MG tablet Take 1 tablet (10 mg total) by mouth daily.  . ferrous sulfate 324 MG TBEC Take 324 mg by mouth daily with breakfast.  . furosemide (LASIX) 40 MG tablet Take 0.5 tablets (20 mg total) by mouth  every other day.  . levETIRAcetam (KEPPRA XR) 500 MG 24 hr tablet Take 1 tablet by mouth daily.  . meclizine (ANTIVERT) 25 MG tablet Take 25 mg by mouth every 6 (six) hours as needed for dizziness.  . metoprolol succinate (TOPROL-XL) 25 MG 24 hr tablet Take 0.5 tablets (12.5 mg total) by mouth daily. Take with or immediately following a meal.  . Omega-3 Fatty Acids (FISH OIL) 1000 MG CAPS Take 1,000 mg by mouth daily.  . pantoprazole (PROTONIX) 40 MG tablet Take 40 mg by mouth daily.  . traMADol (ULTRAM) 50 MG tablet Take 50 mg by mouth 3 (three) times daily as needed for pain.  Marland Kitchen zolpidem (AMBIEN) 10 MG tablet Take 5 mg by  mouth at bedtime as needed for sleep.     Allergies:   Patient has no known allergies.   Social History   Socioeconomic History  . Marital status: Married    Spouse name: Not on file  . Number of children: Not on file  . Years of education: Not on file  . Highest education level: Not on file  Occupational History  . Not on file  Tobacco Use  . Smoking status: Former Smoker    Packs/day: 1.50    Types: Cigarettes    Quit date: 04/07/2020    Years since quitting: 0.6  . Smokeless tobacco: Never Used  Substance and Sexual Activity  . Alcohol use: Never  . Drug use: Never  . Sexual activity: Not on file  Other Topics Concern  . Not on file  Social History Narrative  . Not on file   Social Determinants of Health   Financial Resource Strain: Not on file  Food Insecurity: Not on file  Transportation Needs: Not on file  Physical Activity: Not on file  Stress: Not on file  Social Connections: Not on file     Family History: The patient's family history includes Diabetes in her paternal grandmother; Diabetes Mellitus I in her sister; Hypertension in her brother, father, mother, and sister.  ROS:   Review of Systems  Constitution: Negative for decreased appetite, fever and weight gain.  HENT: Negative for congestion, ear discharge, hoarse voice and sore throat.   Eyes: Negative for discharge, redness, vision loss in right eye and visual halos.  Cardiovascular: Negative for chest pain, dyspnea on exertion, leg swelling, orthopnea and palpitations.  Respiratory: Negative for cough, hemoptysis, shortness of breath and snoring.   Endocrine: Negative for heat intolerance and polyphagia.  Hematologic/Lymphatic: Negative for bleeding problem. Does not bruise/bleed easily.  Skin: Negative for flushing, nail changes, rash and suspicious lesions.  Musculoskeletal: Negative for arthritis, joint pain, muscle cramps, myalgias, neck pain and stiffness.  Gastrointestinal: Negative for  abdominal pain, bowel incontinence, diarrhea and excessive appetite.  Genitourinary: Negative for decreased libido, genital sores and incomplete emptying.  Neurological: Negative for brief paralysis, focal weakness, headaches and loss of balance.  Psychiatric/Behavioral: Negative for altered mental status, depression and suicidal ideas.  Allergic/Immunologic: Negative for HIV exposure and persistent infections.    EKGs/Labs/Other Studies Reviewed:    The following studies were reviewed today:   EKG:  None today  Transthoracic Echocardiogram IMPRESSIONS September 10, 2020 1. Left ventricular ejection fraction, by estimation, is 40 to 45%. The left ventricle has mildly decreased function. The left ventricle has no regional wall motion abnormalities. The left ventricular internal cavity size was mildly dilated. Left  ventricular diastolic parameters were normal.  2. Right ventricular systolic function is normal. The right  ventricular size is normal. There is normal pulmonary artery systolic pressure.  3. The mitral valve is normal in structure. No evidence of mitral valve regurgitation. No evidence of mitral stenosis.  4. The aortic valve is normal in structure. Aortic valve regurgitation is mild. No aortic stenosis is present.  5. The inferior vena cava is normal in size with greater than 50% respiratory variability, suggesting right atrial pressure of 3 mmHg.   FINDINGS  Left Ventricle: Left ventricular ejection fraction, by estimation, is 40 to 45%. The left ventricle has mildly decreased function. The left ventricle has no regional wall motion abnormalities. The left ventricular internal cavity size was mildly  dilated. There is no left ventricular hypertrophy. Left ventricular diastolic parameters were normal.     LV Wall Scoring:  The mid and distal inferior wall is hypokinetic.   Right Ventricle: The right ventricular size is normal. No increase in  right ventricular wall  thickness. Right ventricular systolic function is  normal. There is normal pulmonary artery systolic pressure. The tricuspid  regurgitant velocity is 1.91 m/s, and  with an assumed right atrial pressure of 3 mmHg, the estimated right  ventricular systolic pressure is 17.6 mmHg.   Left Atrium: Left atrial size was normal in size.   Right Atrium: Right atrial size was normal in size.   Pericardium: There is no evidence of pericardial effusion.   Mitral Valve: The mitral valve is normal in structure. No evidence of  mitral valve regurgitation. No evidence of mitral valve stenosis.   Tricuspid Valve: The tricuspid valve is normal in structure. Tricuspid  valve regurgitation is trivial. No evidence of tricuspid stenosis.   Aortic Valve: The aortic valve is normal in structure. Aortic valve  regurgitation is mild. Aortic regurgitation PHT measures 586 msec. No  aortic stenosis is present.   Pulmonic Valve: The pulmonic valve was normal in structure. Pulmonic valve  regurgitation is not visualized. No evidence of pulmonic stenosis.   Aorta: The aortic root is normal in size and structure.   Venous: The inferior vena cava is normal in size with greater than 50%  respiratory variability, suggesting right atrial pressure of 3 mmHg.   IAS/Shunts: No atrial level shunt detected by color flow Doppler.    ZIO monitor The patient wore the monitor for 7 days 19 hours starting 08/13/2020. Indication: Palpitations  The minimum heart rate was 51 bpm, maximum heart rate was 114 bpm, and average heart rate was 67 bpm. Predominant underlying rhythm was Sinus Rhythm.   Premature atrial complexes were rare. Premature Ventricular complexes were rare.  No ventricular tachycardia, no pauses, No AV block, no supraventricular and no atrial fibrillation present. 5 patient triggered events and 5 diary events all associated with sinus rhythm.:   Conclusion: Normal /Unremarkable study, with no  evidence of significant.    Recent Labs: 06/03/2020: ALT 29; TSH 1.863 12/08/2020: BUN 9; Creatinine, Ser 0.97; Hemoglobin 11.4; Magnesium 1.9; Platelets 280; Potassium 4.1; Sodium 141  Recent Lipid Panel    Component Value Date/Time   CHOL 232 (H) 09/17/2020 1446   TRIG 161 (H) 09/17/2020 1446   HDL 46 09/17/2020 1446   CHOLHDL 5.0 (H) 09/17/2020 1446   LDLCALC 157 (H) 09/17/2020 1446    Physical Exam:    VS:  BP 110/72 (BP Location: Left Arm, Patient Position: Sitting, Cuff Size: Normal)   Ht 4\' 11"  (1.499 m)   Wt 141 lb (64 kg)   BMI 28.48 kg/m     Wt Readings  from Last 3 Encounters:  12/08/20 141 lb (64 kg)  10/15/20 136 lb (61.7 kg)  09/17/20 131 lb 9.6 oz (59.7 kg)     GEN: Well nourished, well developed in no acute distress HEENT: Normal NECK: No JVD; No carotid bruits LYMPHATICS: No lymphadenopathy CARDIAC: S1S2 noted,RRR, no murmurs, rubs, gallops RESPIRATORY:  Clear to auscultation without rales, wheezing or rhonchi  ABDOMEN: Soft, non-tender, non-distended, +bowel sounds, no guarding. EXTREMITIES: No edema, No cyanosis, no clubbing MUSCULOSKELETAL:  No deformity  SKIN: Warm and dry NEUROLOGIC:  Alert and oriented x 3, non-focal PSYCHIATRIC:  Normal affect, good insight  ASSESSMENT:    1. Dizziness   2. Coronary artery disease involving native coronary artery of native heart without angina pectoris   3. Essential hypertension   4. Post operative atrial fibrillation (HCC)   5. Mixed hyperlipidemia   6. Tobacco use   7. Depressed left ventricular ejection fraction    PLAN:      Given her dizziness significant fatigue I like to place a monitor on the patient to make sure that atrial fibrillation is not playing a role here.  She tells me that she is not able to do host of her daily activity.  She enjoys going to rehab but she gets significantly tired.  The patient reports that her body will not let her get back to work.    Her blood pressure is on the  lower end. It is difficult to optimize her therapy to guideline medical therapy due to the low blood pressure and now the dizziness.   Continue with her  Current lipid lowering agents.   She is still doing cardiac rehab.   Smoking cessation advised.  The patient is in agreement with the above plan. The patient left the office in stable condition.  The patient will follow up in   Medication Adjustments/Labs and Tests Ordered: Current medicines are reviewed at length with the patient today.  Concerns regarding medicines are outlined above.  Orders Placed This Encounter  Procedures  . Basic metabolic panel  . CBC with Differential/Platelet  . Magnesium  . LONG TERM MONITOR (3-14 DAYS)   No orders of the defined types were placed in this encounter.   Patient Instructions  Medication Instructions:  Your physician recommends that you continue on your current medications as directed. Please refer to the Current Medication list given to you today.  *If you need a refill on your cardiac medications before your next appointment, please call your pharmacy*   Lab Work: Your physician recommends that you return for lab work: TODAY: BMET, Mag, CBC If you have labs (blood work) drawn today and your tests are completely normal, you will receive your results only by: Marland Kitchen MyChart Message (if you have MyChart) OR . A paper copy in the mail If you have any lab test that is abnormal or we need to change your treatment, we will call you to review the results.   Testing/Procedures: A zio monitor was ordered today. It will remain on for 14 days. You will then return monitor and event diary in provided box. It takes 1-2 weeks for report to be downloaded and returned to [place]. We will call you with the results. If monitor falls off or has orange flashing light, please call Zio for further instructions.      Follow-Up: At [facility], you and your health needs are our priority.  As part of our  continuing mission to provide you with exceptional heart care,  we have created designated Provider Care Teams.  These Care Teams include your primary Cardiologist (physician) and Advanced Practice Providers (APPs -  Physician Assistants and Nurse Practitioners) who all work together to provide you with the care you need, when you need it.  We recommend signing up for the patient portal called "MyChart".  Sign up information is provided on this After Visit Summary.  MyChart is used to connect with patients for Virtual Visits (Telemedicine).  Patients are able to view lab/test results, encounter notes, upcoming appointments, etc.  Non-urgent messages can be sent to your provider as well.   To learn more about what you can do with MyChart, go to [email or web address].    Your next appointment:   3 month(s)  The format for your next appointment:   In Person ZIO  WHY IS MY DOCTOR PRESCRIBING ZIO? The Zio system is proven and trusted by physicians to detect and diagnose irregular heart rhythms -- and has been prescribed to hundreds of thousands of patients.  The FDA has cleared the Zio system to monitor for many different kinds of irregular heart rhythms. In a study, physicians were able to reach a diagnosis 90% of the time with the Zio system1.  You can wear the Zio monitor -- a small, discreet, comfortable patch -- during your normal day-to-day activity, including while you sleep, shower, and exercise, while it records every single heartbeat for analysis.  1Barrett, P., et al. Comparison of 24 Hour Holter Monitoring Versus 14 Day Novel Adhesive Patch Electrocardiographic Monitoring. American Journal of Medicine, 2014.  ZIO VS. HOLTER MONITORING The Zio monitor can be comfortably worn for up to 14 days. Holter monitors can be worn for 24 to 48 hours, limiting the time to record any irregular heart rhythms you may have. Zio is able to capture data for the 51% of patients who have their first  symptom-triggered arrhythmia after 48 hours.1  LIVE WITHOUT RESTRICTIONS The Zio ambulatory cardiac monitor is a small, unobtrusive, and water-resistant patch--you might even forget you're wearing it. The Zio monitor records and stores every beat of your heart, whether you're sleeping, working out, or showering. Remove on: May 18th, 2022  Provider:   Thomasene RippleKardie Zamiah Tollett, DO   Other Instructions      Adopting a Healthy Lifestyle.  Know what a healthy weight is for you (roughly BMI <25) and aim to maintain this   Aim for 7+ servings of fruits and vegetables daily   65-80+ fluid ounces of water or unsweet tea for healthy kidneys   Limit to max 1 drink of alcohol per day; avoid smoking/tobacco   Limit animal fats in diet for cholesterol and heart health - choose grass fed whenever available   Avoid highly processed foods, and foods high in saturated/trans fats   Aim for low stress - take time to unwind and care for your mental health   Aim for 150 min of moderate intensity exercise weekly for heart health, and weights twice weekly for bone health   Aim for 7-9 hours of sleep daily   When it comes to diets, agreement about the perfect plan isnt easy to find, even among the experts. Experts at the [employer] developed an idea known as the Healthy Eating Plate. Just imagine a plate divided into logical, healthy portions.   The emphasis is on diet quality:   Load up on vegetables and fruits - one-half of your plate: Aim for color and variety, and remember that potatoes dont  count.   Go for whole grains - one-quarter of your plate: Whole wheat, barley, wheat berries, quinoa, oats, brown rice, and foods made with them. If you want pasta, go with whole wheat pasta.   Protein power - one-quarter of your plate: Fish, chicken, beans, and nuts are all healthy, versatile protein sources. Limit red meat.   The diet, however, does go beyond the plate, offering a few other  suggestions.   Use healthy plant oils, such as olive, canola, soy, corn, sunflower and peanut. Check the labels, and avoid partially hydrogenated oil, which have unhealthy trans fats.   If youre thirsty, drink water. Coffee and tea are good in moderation, but skip sugary drinks and limit milk and dairy products to one or two daily servings.   The type of carbohydrate in the diet is more important than the amount. Some sources of carbohydrates, such as vegetables, fruits, whole grains, and beans-are healthier than others.   Finally, stay active  Signed, Thomasene Ripple, DO  12/10/2020 5:49 PM    [facility]

## 2020-12-09 LAB — CBC WITH DIFFERENTIAL/PLATELET
Basophils Absolute: 0.1 10*3/uL (ref 0.0–0.2)
Basos: 1 %
EOS (ABSOLUTE): 0.3 10*3/uL (ref 0.0–0.4)
Eos: 4 %
Hematocrit: 34.1 % (ref 34.0–46.6)
Hemoglobin: 11.4 g/dL (ref 11.1–15.9)
Immature Grans (Abs): 0 10*3/uL (ref 0.0–0.1)
Immature Granulocytes: 0 %
Lymphocytes Absolute: 2.6 10*3/uL (ref 0.7–3.1)
Lymphs: 36 %
MCH: 31.4 pg (ref 26.6–33.0)
MCHC: 33.4 g/dL (ref 31.5–35.7)
MCV: 94 fL (ref 79–97)
Monocytes Absolute: 0.6 10*3/uL (ref 0.1–0.9)
Monocytes: 9 %
Neutrophils Absolute: 3.5 10*3/uL (ref 1.4–7.0)
Neutrophils: 50 %
Platelets: 280 10*3/uL (ref 150–450)
RBC: 3.63 x10E6/uL — ABNORMAL LOW (ref 3.77–5.28)
RDW: 12.9 % (ref 11.7–15.4)
WBC: 7 10*3/uL (ref 3.4–10.8)

## 2020-12-09 LAB — BASIC METABOLIC PANEL
BUN/Creatinine Ratio: 9 — ABNORMAL LOW (ref 12–28)
BUN: 9 mg/dL (ref 8–27)
CO2: 26 mmol/L (ref 20–29)
Calcium: 9.5 mg/dL (ref 8.7–10.3)
Chloride: 102 mmol/L (ref 96–106)
Creatinine, Ser: 0.97 mg/dL (ref 0.57–1.00)
Glucose: 83 mg/dL (ref 65–99)
Potassium: 4.1 mmol/L (ref 3.5–5.2)
Sodium: 141 mmol/L (ref 134–144)
eGFR: 67 mL/min/{1.73_m2} (ref >59)

## 2020-12-09 LAB — MAGNESIUM: Magnesium: 1.9 mg/dL (ref 1.6–2.3)

## 2020-12-10 ENCOUNTER — Telehealth: Payer: Self-pay | Admitting: Cardiology

## 2020-12-10 NOTE — Telephone Encounter (Signed)
Pt is returning call for lab results from her most recent visit. Please advise

## 2020-12-13 NOTE — Telephone Encounter (Signed)
Left message for patient to return our call.

## 2020-12-13 NOTE — Telephone Encounter (Signed)
Patient returning call. States she has to come by the office to pick up some papers and wants to know if she can talk to [place] then. She said she is going to rehab in a little bit so probably wont be able to answer the phone.

## 2020-12-13 NOTE — Telephone Encounter (Signed)
Spoke with patient see chart.  

## 2020-12-16 ENCOUNTER — Telehealth: Payer: Self-pay | Admitting: *Deleted

## 2020-12-16 NOTE — Telephone Encounter (Signed)
Left message for pt to call us back. Need to know why zio was sent back with less than 48 hours worth of data on it.

## 2020-12-21 ENCOUNTER — Telehealth: Payer: Self-pay | Admitting: Cardiology

## 2020-12-21 NOTE — Telephone Encounter (Signed)
Clifton Custard is calling requesting Kysha's Lipid Panel be faxed over to [phone]. Based on our records she had a Lipid panel performed on 09/17/20. Please advise.

## 2020-12-22 NOTE — Telephone Encounter (Signed)
Lipid results faxed to provided number.

## 2020-12-22 NOTE — Telephone Encounter (Signed)
Faxed to provided number  

## 2020-12-23 ENCOUNTER — Telehealth: Payer: Self-pay | Admitting: Cardiology

## 2020-12-23 NOTE — Telephone Encounter (Signed)
New Message:    Pt says she is having problems with Zio Patch, it keep fflashing orange.

## 2020-12-24 NOTE — Telephone Encounter (Signed)
Spoke with patient about the monitor. They are going to send her another one. She states "I want to come by the office and have ya'll put it on, I might have put it on wrong." She still come by the office on Monday to have it applied.

## 2020-12-24 NOTE — Telephone Encounter (Signed)
Attempted to call patient, no answer. Left message for patient to return our call.   

## 2020-12-24 NOTE — Telephone Encounter (Signed)
Attempted to call patient back, no answer. Left message for patient to return our call.

## 2020-12-24 NOTE — Telephone Encounter (Signed)
Spoke with patient, see chart.    

## 2020-12-27 ENCOUNTER — Ambulatory Visit (INDEPENDENT_AMBULATORY_CARE_PROVIDER_SITE_OTHER): Payer: 59

## 2020-12-27 DIAGNOSIS — R42 Dizziness and giddiness: Secondary | ICD-10-CM | POA: Diagnosis not present

## 2020-12-30 NOTE — Telephone Encounter (Signed)
Pt came into office on 5/23 and will try to wear for the full 14 days. She will mail back when done.

## 2021-01-11 ENCOUNTER — Telehealth: Payer: Self-pay | Admitting: *Deleted

## 2021-01-11 DIAGNOSIS — R42 Dizziness and giddiness: Secondary | ICD-10-CM

## 2021-01-11 NOTE — Telephone Encounter (Signed)
Got email from [employer] about monitor they received which had not been registered. Pt has had several monitors put on and one was lost one had no data and one fell off. They have a monitor that she has worn for 9 days so am putting in order and will wait to get results from [employer]. Apparently it was put on with out any documentation or order.

## 2021-01-20 ENCOUNTER — Telehealth: Payer: Self-pay | Admitting: Cardiology

## 2021-01-20 NOTE — Telephone Encounter (Signed)
Clydie Braun from [facility] [place] calling about patient, states that she was put on a monitor without prior authorization and would like to start the process   Codes needed are: 73668  Can be done on [email or web address] or call [phone] [facility] Utilization Department   Date Of Service: 10/12/20 - 10/25/20

## 2021-01-27 NOTE — Telephone Encounter (Signed)
Teresa [name] from [employer] states she is returning a call from [place], [place] regarding a denied claim. She states she will be leaving the office shortly and will return around 2:00 PM, so a call may be returned to her at that time.  Phone #: [phone] (ext#: [phone])

## 2021-02-01 ENCOUNTER — Ambulatory Visit: Payer: 59 | Admitting: Cardiology

## 2021-02-10 ENCOUNTER — Other Ambulatory Visit: Payer: Self-pay | Admitting: Cardiology

## 2021-02-24 NOTE — Telephone Encounter (Signed)
Faxed PA to [employer] per insurance company on 02/03/21, included Dr. Mallory Shirk notes.

## 2021-03-07 ENCOUNTER — Telehealth: Payer: Self-pay | Admitting: Cardiology

## 2021-03-07 NOTE — Telephone Encounter (Signed)
Okey Regal from [facility] called to let heart care know that Teresa [name] had a cardiac rehab monitor from the date of 10/12/20 to 11/04/20 and no Auth was obtained. Please call [phone] to obtain an Authorization from [facility]

## 2021-03-08 ENCOUNTER — Telehealth: Payer: Self-pay | Admitting: Cardiology

## 2021-03-08 NOTE — Telephone Encounter (Signed)
Spoke with Cassandra from [facility]. She is requesting a CPT-code.Will call [facility] to obtain the code they used to place the monitor.

## 2021-03-08 NOTE — Telephone Encounter (Signed)
Called [facility] to speak about this matter. No-one was available to come to the phone, left message for tem to call back.

## 2021-03-08 NOTE — Telephone Encounter (Signed)
Spoke with Raynelle Fanning from [facility]. She states she does not know a Okey Regal but she will look into the matter. The patient has already completed her 36 weeks and graduated from the program. She will call me back if she needs anymore information from [place].

## 2021-03-08 NOTE — Telephone Encounter (Signed)
   Raynelle Fanning with cardiac rehab at [facility] calling to let Teresa [name] know they are taking care of the pt's issue, she said they are resubmitting prior auth for the pt.

## 2021-03-15 NOTE — Telephone Encounter (Addendum)
Called and spoke with Clydie Braun. She asked me to call [facility] to obtain an auth for the cardiac monitor they placed on the patient.  Claim #: [id number] E [phone] [facility] [facility] department Ref #: [id number]

## 2021-03-15 NOTE — Telephone Encounter (Signed)
Follow up:   Please call back for authorization

## 2021-03-16 ENCOUNTER — Ambulatory Visit (INDEPENDENT_AMBULATORY_CARE_PROVIDER_SITE_OTHER): Payer: 59 | Admitting: Cardiology

## 2021-03-16 ENCOUNTER — Other Ambulatory Visit: Payer: Self-pay

## 2021-03-16 ENCOUNTER — Encounter: Payer: Self-pay | Admitting: Neurology

## 2021-03-16 VITALS — BP 132/68 | HR 70 | Ht 58.6 in | Wt 149.6 lb

## 2021-03-16 DIAGNOSIS — I4819 Other persistent atrial fibrillation: Secondary | ICD-10-CM

## 2021-03-16 DIAGNOSIS — I2510 Atherosclerotic heart disease of native coronary artery without angina pectoris: Secondary | ICD-10-CM

## 2021-03-16 DIAGNOSIS — G40909 Epilepsy, unspecified, not intractable, without status epilepticus: Secondary | ICD-10-CM

## 2021-03-16 DIAGNOSIS — I255 Ischemic cardiomyopathy: Secondary | ICD-10-CM | POA: Diagnosis not present

## 2021-03-16 DIAGNOSIS — I10 Essential (primary) hypertension: Secondary | ICD-10-CM

## 2021-03-16 DIAGNOSIS — E782 Mixed hyperlipidemia: Secondary | ICD-10-CM

## 2021-03-16 NOTE — Progress Notes (Signed)
Cardiology Office Note:    Date:  03/16/2021   ID:  Teresa [name], DOB 1961-01-21, MRN [id number]  PCP:  Simone Curia, MD  Cardiologist:  Thomasene Ripple, DO  Electrophysiologist:  None   Referring MD: Carron Curie, MD     History of Present Illness:    Teresa [name] is a 60 y.o. female with a hx of coronary artery disease status post CABG x4 (SVG to PDA, SVG to first obtuse marginal, SVG to diagonal and LIMA to LAD) on April 25, 2020, ischemic cardiomyopathy EF in September 2021 with severely depressed EF 25 to 30%, however in October 2021 her EF improved slightly to 35 to 40%, hypertension, hyperlipidemia, smoker, prolonged hospitalization from September overall 90 days, respiratory failure was on mechanical ventilation then status post tracheostomy.   I saw the patient in January 2022 at that time she was just back home from rehab.  We continue her medications which include her aspirin as well as her Lipitor.  We also repeated lab work for lipid profile.  During our visit she was still on amiodarone for her postop atrial fibrillation.    I saw the patient in September 17, 2020 at that time.  Refer the patient to cardiac rehab.  She was also hypotensive so I cut back on her metoprolol to 12.5 and decrease her Lasix to every other day.  She is doing well today.  She tells me she is tired but has been able to tolerate rehab.   I saw the patient on October 15, 2020 at that time her blood pressure had improved since cutting back on her Lasix and Toprol-XL.  She had not been able to do cardiac rehab due to fatigue.  At her last visit on Dec 09, 2018.  She reported stressors and significant dizziness we placed a monitor on the patient.  This monitor did come back without any evidence of arrhythmia.  Today she is doing a little better she is completed her rehab.  But there is concern from her son where the patient has had periods of amnesia.  She reports some improvement in her dizziness.  Lab issues is  the fact that her arthritis is getting worse as well.  Past Medical History:  Diagnosis Date   Acute on chronic respiratory failure with hypoxia (HCC)    Acute renal failure due to tubular necrosis [facility])    Atrial flutter (HCC)    Cardiogenic shock (HCC)    Coronary artery disease 08/13/2020   Coronary artery disease involving native coronary artery of native heart    Coronary artery disease involving native coronary artery of native heart without angina pectoris 09/17/2020   Depressed left ventricular ejection fraction 08/13/2020   Depression, unspecified 10/09/2015   Essential hypertension 10/06/2015   Gastro-esophageal reflux disease without esophagitis 10/06/2015   HFrEF (heart failure with reduced ejection fraction) (HCC) 08/13/2020   History of tonic-clonic seizures 04/25/2020   Hyperlipidemia 10/06/2015   Ischemic cardiomyopathy 08/13/2020   Mixed hyperlipidemia 09/17/2020   Pericardial effusion 08/13/2020   Post operative atrial fibrillation (HCC) 08/13/2020   Seizure disorder (HCC) 03/03/2016   Shortness of breath 04/25/2020   Syncope 04/25/2020   Tobacco use 08/13/2020    Past Surgical History:  Procedure Laterality Date   AORTIC VALVE REPLACEMENT (AVR)/CORONARY ARTERY BYPASS GRAFTING (CABG)  04/26/2020   X4   APPENDECTOMY     TONSILLECTOMY     TOTAL ABDOMINAL HYSTERECTOMY     TOTAL HIP ARTHROPLASTY  Current Medications: Current Meds  Medication Sig   albuterol (VENTOLIN HFA) 108 (90 Base) MCG/ACT inhaler Inhale 1 puff into the lungs every 6 (six) hours as needed for wheezing or shortness of breath.   aspirin 81 MG chewable tablet Chew 1 tablet by mouth daily.   atorvastatin (LIPITOR) 20 MG tablet Take 20 mg by mouth at bedtime.   cholecalciferol (VITAMIN D3) 25 MCG (1000 UNIT) tablet Take 1,000 Units by mouth daily.   citalopram (CELEXA) 20 MG tablet Take 20 mg by mouth daily.   ezetimibe (ZETIA) 10 MG tablet Take 1 tablet (10 mg total) by mouth daily.   ferrous sulfate 324 MG TBEC  Take 324 mg by mouth daily with breakfast.   furosemide (LASIX) 40 MG tablet Take 0.5 tablets (20 mg total) by mouth daily.   gabapentin (NEURONTIN) 300 MG capsule Take 300 mg by mouth at bedtime.   levETIRAcetam (KEPPRA XR) 500 MG 24 hr tablet Take 1 tablet by mouth daily.   meclizine (ANTIVERT) 25 MG tablet Take 25 mg by mouth every 6 (six) hours as needed for dizziness.   meloxicam (MOBIC) 7.5 MG tablet Take 7.5 mg by mouth 2 (two) times daily as needed for pain.   metoprolol succinate (TOPROL-XL) 25 MG 24 hr tablet Take 0.5 tablets (12.5 mg total) by mouth daily.   Omega-3 Fatty Acids (FISH OIL) 1000 MG CAPS Take 1,000 mg by mouth daily.   pantoprazole (PROTONIX) 40 MG tablet Take 40 mg by mouth daily.   traMADol (ULTRAM) 50 MG tablet Take 50 mg by mouth 3 (three) times daily as needed for pain.   zolpidem (AMBIEN) 10 MG tablet Take 5 mg by mouth at bedtime as needed for sleep.     Allergies:   Patient has no known allergies.   Social History   Socioeconomic History   Marital status: Married    Spouse name: Not on file   Number of children: Not on file   Years of education: Not on file   Highest education level: Not on file  Occupational History   Not on file  Tobacco Use   Smoking status: Former    Packs/day: 1.50    Types: Cigarettes    Quit date: 04/07/2020    Years since quitting: 0.9   Smokeless tobacco: Never  Substance and Sexual Activity   Alcohol use: Never   Drug use: Never   Sexual activity: Not on file  Other Topics Concern   Not on file  Social History Narrative   Not on file   Social Determinants of Health   Financial Resource Strain: Not on file  Food Insecurity: Not on file  Transportation Needs: Not on file  Physical Activity: Not on file  Stress: Not on file  Social Connections: Not on file     Family History: The patient's family history includes Diabetes in her paternal grandmother; Diabetes Mellitus I in her sister; Hypertension in her  brother, father, mother, and sister.  ROS:   Review of Systems  Constitution: Negative for decreased appetite, fever and weight gain.  HENT: Negative for congestion, ear discharge, hoarse voice and sore throat.   Eyes: Negative for discharge, redness, vision loss in right eye and visual halos.  Cardiovascular: Negative for chest pain, dyspnea on exertion, leg swelling, orthopnea and palpitations.  Respiratory: Negative for cough, hemoptysis, shortness of breath and snoring.   Endocrine: Negative for heat intolerance and polyphagia.  Hematologic/Lymphatic: Negative for bleeding problem. Does not bruise/bleed easily.  Skin:  Negative for flushing, nail changes, rash and suspicious lesions.  Musculoskeletal: Negative for arthritis, joint pain, muscle cramps, myalgias, neck pain and stiffness.  Gastrointestinal: Negative for abdominal pain, bowel incontinence, diarrhea and excessive appetite.  Genitourinary: Negative for decreased libido, genital sores and incomplete emptying.  Neurological: Negative for brief paralysis, focal weakness, headaches and loss of balance.  Psychiatric/Behavioral: Negative for altered mental status, depression and suicidal ideas.  Allergic/Immunologic: Negative for HIV exposure and persistent infections.    EKGs/Labs/Other Studies Reviewed:    The following studies were reviewed today:   EKG: None today  ZIO monitor Patch Wear Time:  9 days and 9 hours starting Dec 27, 2020. Indication: Dizziness   Patient had a minimum HR of 46 bpm, maximum HR of 98 bpm, and average HR of 60 bpm. Predominant underlying rhythm was Sinus Rhythm. Bundle Branch Block/IVCD was present.   Premature atrial complexes were rare. Premature ventricular complexes were rare.   Symptoms are associated with sinus rhythm.   No ventricular tachycardia, no pauses, no supraventricular tachycardia noted.   Conclusion: Normal/unremarkable study with no significant  arrhythmia.  Transthoracic  Echocardiogram IMPRESSIONS September 10, 2020  1. Left ventricular ejection fraction, by estimation, is 40 to 45%. The left ventricle has mildly decreased function. The left ventricle has no regional wall motion abnormalities. The left ventricular internal cavity size was mildly dilated. Left  ventricular diastolic parameters were normal.   2. Right ventricular systolic function is normal. The right ventricular size is normal. There is normal pulmonary artery systolic pressure.   3. The mitral valve is normal in structure. No evidence of mitral valve regurgitation. No evidence of mitral stenosis.   4. The aortic valve is normal in structure. Aortic valve regurgitation is mild. No aortic stenosis is present.   5. The inferior vena cava is normal in size with greater than 50% respiratory variability, suggesting right atrial pressure of 3 mmHg.   FINDINGS   Left Ventricle: Left ventricular ejection fraction, by estimation, is 40 to 45%. The left ventricle has mildly decreased function. The left ventricle has no regional wall motion abnormalities. The left ventricular internal cavity size was mildly  dilated. There is no left ventricular hypertrophy. Left ventricular diastolic parameters were normal.      LV Wall Scoring:  The mid and distal inferior wall is hypokinetic.   Right Ventricle: The right ventricular size is normal. No increase in  right ventricular wall thickness. Right ventricular systolic function is  normal. There is normal pulmonary artery systolic pressure. The tricuspid  regurgitant velocity is 1.91 m/s, and   with an assumed right atrial pressure of 3 mmHg, the estimated right  ventricular systolic pressure is 17.6 mmHg.   Left Atrium: Left atrial size was normal in size.   Right Atrium: Right atrial size was normal in size.   Pericardium: There is no evidence of pericardial effusion.   Mitral Valve: The mitral valve is normal in structure. No  evidence of  mitral valve regurgitation. No evidence of mitral valve stenosis.   Tricuspid Valve: The tricuspid valve is normal in structure. Tricuspid  valve regurgitation is trivial. No evidence of tricuspid stenosis.   Aortic Valve: The aortic valve is normal in structure. Aortic valve  regurgitation is mild. Aortic regurgitation PHT measures 586 msec. No  aortic stenosis is present.   Pulmonic Valve: The pulmonic valve was normal in structure. Pulmonic valve  regurgitation is not visualized. No evidence of pulmonic stenosis.   Aorta: The aortic root  is normal in size and structure.   Venous: The inferior vena cava is normal in size with greater than 50%  respiratory variability, suggesting right atrial pressure of 3 mmHg.   IAS/Shunts: No atrial level shunt detected by color flow Doppler.     ZIO monitor The patient wore the monitor for 7  days 19  hours starting 08/13/2020. Indication: Palpitations   The minimum heart rate was 51  bpm, maximum heart rate was 114 bpm, and average heart rate was 67  bpm. Predominant underlying rhythm was Sinus Rhythm.     Premature atrial complexes were rare. Premature Ventricular complexes were rare.   No ventricular tachycardia, no pauses, No AV block, no supraventricular and no atrial fibrillation present. 5 patient triggered events and 5 diary events all associated with sinus rhythm.:   Conclusion: Normal /Unremarkable study, with no evidence of significant.      Recent Labs: 06/03/2020: ALT 29; TSH 1.863 12/08/2020: BUN 9; Creatinine, Ser 0.97; Hemoglobin 11.4; Magnesium 1.9; Platelets 280; Potassium 4.1; Sodium 141  Recent Lipid Panel    Component Value Date/Time   CHOL 232 (H) 09/17/2020 1446   TRIG 161 (H) 09/17/2020 1446   HDL 46 09/17/2020 1446   CHOLHDL 5.0 (H) 09/17/2020 1446   LDLCALC 157 (H) 09/17/2020 1446    Physical Exam:    VS:  BP 132/68   Pulse 70   Ht 4' 10.6" (1.488 m)   Wt 149 lb 9.6 oz (67.9 kg)   SpO2  97%   BMI 30.63 kg/m     Wt Readings from Last 3 Encounters:  03/16/21 149 lb 9.6 oz (67.9 kg)  12/08/20 141 lb (64 kg)  10/15/20 136 lb (61.7 kg)     GEN: Well nourished, well developed in no acute distress HEENT: Normal NECK: No JVD; No carotid bruits LYMPHATICS: No lymphadenopathy CARDIAC: S1S2 noted,RRR, no murmurs, rubs, gallops RESPIRATORY:  Clear to auscultation without rales, wheezing or rhonchi  ABDOMEN: Soft, non-tender, non-distended, +bowel sounds, no guarding. EXTREMITIES: No edema, No cyanosis, no clubbing MUSCULOSKELETAL:  No deformity  SKIN: Warm and dry NEUROLOGIC:  Alert and oriented x 3, non-focal PSYCHIATRIC:  Normal affect, good insight  ASSESSMENT:    1. Coronary artery disease involving native coronary artery of native heart without angina pectoris   2. Essential hypertension   3. Ischemic cardiomyopathy   4. Post operative atrial fibrillation (HCC)   5. Mixed hyperlipidemia   6. Seizure disorder [facility])    PLAN:     She was doing well from a cardiovascular standpoint.  No anginal symptoms.  We will continue her current medication regimen. Discussed her monitor results we did not show any arrhythmia.  She has not had any episodes of syncope.  She has a history of seizure and has had some episodes of amnesia which is very concerning.  She has not seen neurology in quite some time now.  As her neurologist moved out of the area.  I am referring the patient to neurology at this time.  Her PCP has started steroid injections given her worsening arthritis.    The patient is in agreement with the above plan. The patient left the office in stable condition.  The patient will follow up in 6 months or sooner if needed.    Medication Adjustments/Labs and Tests Ordered: Current medicines are reviewed at length with the patient today.  Concerns regarding medicines are outlined above.  Orders Placed This Encounter  Procedures   Ambulatory referral to Neurology  No orders of the defined types were placed in this encounter.   Patient Instructions  Medication Instructions:  No medication changes. *If you need a refill on your cardiac medications before your next appointment, please call your pharmacy*   Lab Work: None ordered If you have labs (blood work) drawn today and your tests are completely normal, you will receive your results only by: MyChart Message (if you have MyChart) OR A paper copy in the mail If you have any lab test that is abnormal or we need to change your treatment, we will call you to review the results.   Testing/Procedures: None ordered   Follow-Up: At [facility], you and your health needs are our priority.  As part of our continuing mission to provide you with exceptional heart care, we have created designated Provider Care Teams.  These Care Teams include your primary Cardiologist (physician) and Advanced Practice Providers (APPs -  Physician Assistants and Nurse Practitioners) who all work together to provide you with the care you need, when you need it.  We recommend signing up for the patient portal called "MyChart".  Sign up information is provided on this After Visit Summary.  MyChart is used to connect with patients for Virtual Visits (Telemedicine).  Patients are able to view lab/test results, encounter notes, upcoming appointments, etc.  Non-urgent messages can be sent to your provider as well.   To learn more about what you can do with MyChart, go to [email or web address].    Your next appointment:   6 month(s)  The format for your next appointment:   In Person  Provider:   Thomasene Ripple, MD   Other Instructions NA     Adopting a Healthy Lifestyle.  Know what a healthy weight is for you (roughly BMI <25) and aim to maintain this   Aim for 7+ servings of fruits and vegetables daily   65-80+ fluid ounces of water or unsweet tea for healthy kidneys   Limit to max 1 drink of alcohol per day;  avoid smoking/tobacco   Limit animal fats in diet for cholesterol and heart health - choose grass fed whenever available   Avoid highly processed foods, and foods high in saturated/trans fats   Aim for low stress - take time to unwind and care for your mental health   Aim for 150 min of moderate intensity exercise weekly for heart health, and weights twice weekly for bone health   Aim for 7-9 hours of sleep daily   When it comes to diets, agreement about the perfect plan isnt easy to find, even among the experts. Experts at the [facility] [employer] developed an idea known as the Healthy Eating Plate. Just imagine a plate divided into logical, healthy portions.   The emphasis is on diet quality:   Load up on vegetables and fruits - one-half of your plate: Aim for color and variety, and remember that potatoes dont count.   Go for whole grains - one-quarter of your plate: Whole wheat, barley, wheat berries, quinoa, oats, brown rice, and foods made with them. If you want pasta, go with whole wheat pasta.   Protein power - one-quarter of your plate: Fish, chicken, beans, and nuts are all healthy, versatile protein sources. Limit red meat.   The diet, however, does go beyond the plate, offering a few other suggestions.   Use healthy plant oils, such as olive, canola, soy, corn, sunflower and peanut. Check the labels, and avoid partially hydrogenated oil, which  have unhealthy trans fats.   If youre thirsty, drink water. Coffee and tea are good in moderation, but skip sugary drinks and limit milk and dairy products to one or two daily servings.   The type of carbohydrate in the diet is more important than the amount. Some sources of carbohydrates, such as vegetables, fruits, whole grains, and beans-are healthier than others.   Finally, stay active  Signed, Thomasene Ripple, DO  03/16/2021 2:25 PM    [facility]

## 2021-03-16 NOTE — Patient Instructions (Signed)

## 2021-04-20 ENCOUNTER — Encounter: Payer: Self-pay | Admitting: Neurology

## 2021-04-20 ENCOUNTER — Other Ambulatory Visit: Payer: Self-pay

## 2021-04-20 ENCOUNTER — Ambulatory Visit (INDEPENDENT_AMBULATORY_CARE_PROVIDER_SITE_OTHER): Payer: 59 | Admitting: Neurology

## 2021-04-20 VITALS — BP 131/81 | HR 60 | Ht 59.0 in | Wt 156.8 lb

## 2021-04-20 DIAGNOSIS — G40009 Localization-related (focal) (partial) idiopathic epilepsy and epileptic syndromes with seizures of localized onset, not intractable, without status epilepticus: Secondary | ICD-10-CM | POA: Diagnosis not present

## 2021-04-20 MED ORDER — LEVETIRACETAM ER 500 MG PO TB24: ORAL_TABLET | ORAL | 11 refills | Status: DC
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
## 2021-04-20 NOTE — Patient Instructions (Signed)
Increase Keppra XR 500mg : take 3 tablets daily. If it makes you too drowsy, you can take 2 tablets in AM, 1 tablet in PM  2. Schedule MRI brain with and without contrast  3. Schedule 1-hour EEG  4. As per [place] driving laws, no driving after an episode of loss of awareness, until 6 months event-free  5. Follow-up in 3-4 months, call for any changes   Seizure Precautions: 1. If medication has been prescribed for you to prevent seizures, take it exactly as directed.  Do not stop taking the medicine without talking to your doctor first, even if you have not had a seizure in a long time.   2. Avoid activities in which a seizure would cause danger to yourself or to others.  Don't operate dangerous machinery, swim alone, or climb in high or dangerous places, such as on ladders, roofs, or girders.  Do not drive unless your doctor says you may.  3. If you have any warning that you may have a seizure, lay down in a safe place where you can't hurt yourself.    4.  No driving for 6 months from last seizure, as per [facility].   Please refer to the following link on the Epilepsy Foundation of America's website for more information: http://www.epilepsyfoundation.org/answerplace/Social/driving/drivingu.cfm   5.  Maintain good sleep hygiene.  6.  Contact your doctor if you have any problems that may be related to the medicine you are taking.  7.  Call 911 and bring the patient back to the ED if:        A.  The seizure lasts longer than 5 minutes.       B.  The patient doesn't awaken shortly after the seizure  C.  The patient has new problems such as difficulty seeing, speaking or moving  D.  The patient was injured during the seizure  E.  The patient has a temperature over 102 F (39C)  F.  The patient vomited and now is having trouble breathing

## 2021-04-27 ENCOUNTER — Ambulatory Visit: Payer: 59 | Admitting: Neurology

## 2021-04-27 ENCOUNTER — Other Ambulatory Visit: Payer: Self-pay

## 2021-04-27 DIAGNOSIS — G40009 Localization-related (focal) (partial) idiopathic epilepsy and epileptic syndromes with seizures of localized onset, not intractable, without status epilepticus: Secondary | ICD-10-CM | POA: Diagnosis not present

## 2021-04-28 NOTE — Procedures (Signed)
ELECTROENCEPHALOGRAM REPORT  Date of Study: 04/27/2021  Patient's Name: Teresa [name] MRN: [id number] Date of Birth: 01-Feb-1961  Referring Provider: Dr. Patrcia Dolly  Clinical History: This is a 60 year old woman with left temporal lobe epilepsy, memory changes, left-sided sensory changes, loss of awareness in August 2022. EEG for classification.  Medications: KEPPRA XR 500 MG 24 hr tablet NEURONTIN 300 MG capsule VENTOLIN HFA 108 (90 Base) MCG/ACT inhaler aspirin 81 MG chewable tablet LIPITOR 20 MG tablet VITAMIN D3 25 MCG (1000 UNIT) tablet ZETIA 10 MG tablet ferrous sulfate 324 MG TBEC LASIX 40 MG tablet ANTIVERT 25 MG tablet MOBIC 7.5 MG tablet TOPROL-XL 25 MG 24 hr tablet FISH OIL 1000 MG CAPS PROTONIX 40 MG tablet ULTRAM 50 MG tablet AMBIEN 10 MG tablet  Technical Summary: A multichannel digital 1-hour  EEG recording measured by the international 10-20 system with electrodes applied with paste and impedances below 5000 ohms performed in our laboratory with EKG monitoring in an awake and asleep patient.  Hyperventilation was not performed. Photic stimulation was performed.  The digital EEG was referentially recorded, reformatted, and digitally filtered in a variety of bipolar and referential montages for optimal display.    Description: The patient is awake and asleep during the recording.  During maximal wakefulness, there is a symmetric, medium voltage 9 Hz posterior dominant rhythm that attenuates with eye opening.  There is occasional focal 2-3 Hz delta slowing over the left temporal region. During drowsiness and sleep, there is an increase in theta slowing of the background.  Vertex waves and symmetric sleep spindles were seen.  Photic stimulation did not elicit any abnormalities.  There  is significant muscle artifact majority of the recording, in between artifact there were no epileptiform discharges or electrographic seizures seen.    EKG lead was  unremarkable.  Impression: This 1-hour awake and asleep EEG is abnormal due to occasional focal slowing over the left temporal region. Study is slightly limited due to muscle artifact obscuring EEG several times during the recording.  Clinical Correlation of the above findings indicates focal cerebral dysfunction over the left temporal region suggestive of underlying structural or physiologic abnormality. The absence of epileptiform discharges does not exclude a clinical diagnosis of epilepsy. Clinical correlation is advised.    Patrcia Dolly, M.D.

## 2021-05-02 ENCOUNTER — Telehealth: Payer: Self-pay

## 2021-05-02 NOTE — Telephone Encounter (Signed)
-----   Message from Van Clines, MD sent at 04/29/2021 11:06 AM EDT ----- Pls let patient/son know the EEG did not show any seizure activity but showed similar changes on the left side with her prior EEG, continue on higher dose of Keppra, proceed with MRI as scheduled. Thanks

## 2021-05-02 NOTE — Telephone Encounter (Signed)
Pt called and informed EEG did not show any seizure activity but showed similar changes on the left side with her prior EEG, continue on higher dose of Keppra, proceed with MRI as scheduled

## 2021-05-11 ENCOUNTER — Ambulatory Visit
Admission: RE | Admit: 2021-05-11 | Discharge: 2021-05-11 | Disposition: A | Discharge status: Home / Self Care | Payer: 59 | Source: Ambulatory Visit | Attending: Neurology | Admitting: Neurology

## 2021-05-11 ENCOUNTER — Telehealth: Payer: Self-pay

## 2021-05-11 ENCOUNTER — Other Ambulatory Visit: Payer: Self-pay

## 2021-05-11 DIAGNOSIS — G40009 Localization-related (focal) (partial) idiopathic epilepsy and epileptic syndromes with seizures of localized onset, not intractable, without status epilepticus: Secondary | ICD-10-CM

## 2021-05-11 DIAGNOSIS — I729 Aneurysm of unspecified site: Secondary | ICD-10-CM

## 2021-05-11 IMAGING — MR MR HEAD WO/W CM
10 of 12 series · 36 of 48 positions shown · IV contrast (multihance)
Comparison: Prior head CT examinations [DATE] and earlier.

CLINICAL DATA: Provided history: Seizure. Additional history
provided by scanning technologist: Patient reports history of
seizures, most recently last week.

EXAM:
MRI HEAD WITHOUT AND WITH CONTRAST
TECHNIQUE: Multiplanar, multiecho pulse sequences of the brain and surrounding
structures were obtained without and with intravenous contrast.
CONTRAST:  14mL MULTIHANCE GADOBENATE DIMEGLUMINE 529 MG/ML IV SOLN

[Series 2: T1 · sagittal · 5.0mm · 0.45mm/px · 2 of 23 slices shown]
[im 1/23]
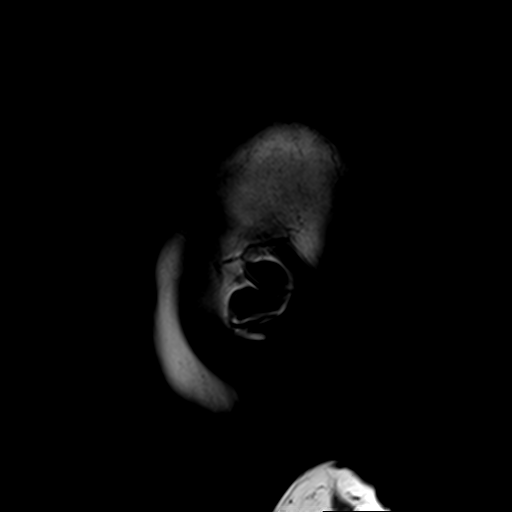
[im 23/23]
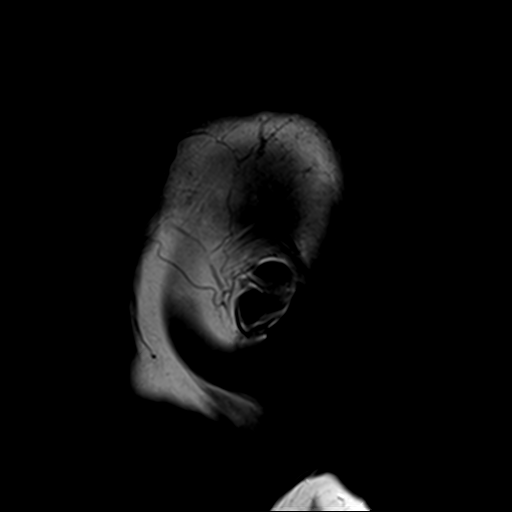

[Series 3: DWI · axial · 3.0mm · 1.80mm/px · z∈[-28,+119]mm · 8 of 100 slices shown (1 of 2)]
[im 1/100]
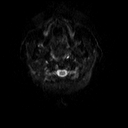
[im 15/100]
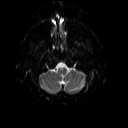
[im 29/100]
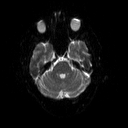
[im 43/100]
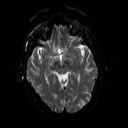
[im 57/100]
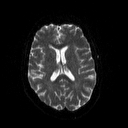
[im 71/100]
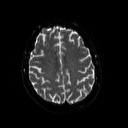
[im 85/100]
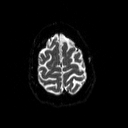
[im 100/100]
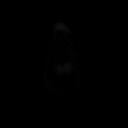

[Series 4: DWI · axial · 3.0mm · 1.80mm/px · z∈[-28,+119]mm · 4 of 47 slices shown (2 of 2)]
[im 1/47]
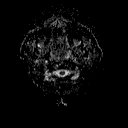
[im 16/47]
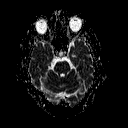
[im 31/47]
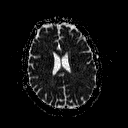
[im 47/47]
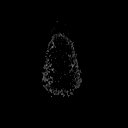

[Series 5: T2 · axial · 5.0mm · 0.51mm/px · z∈[-25,+117]mm · 2 of 22 slices shown (1 of 3)]
[im 1/22]
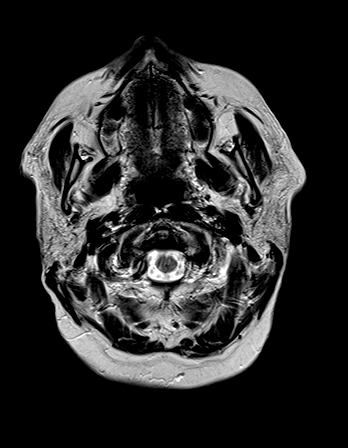
[im 22/22]
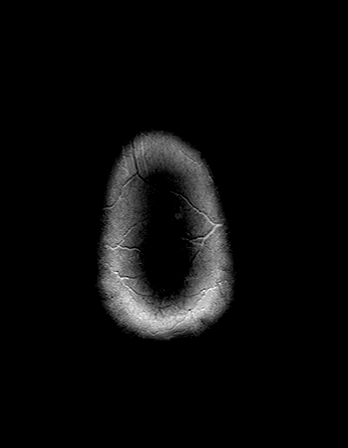

[Series 6: FLAIR · axial · 3.0mm · 0.45mm/px · z∈[-24,+120]mm · 3 of 32 slices shown (1 of 2)]
[im 1/32]
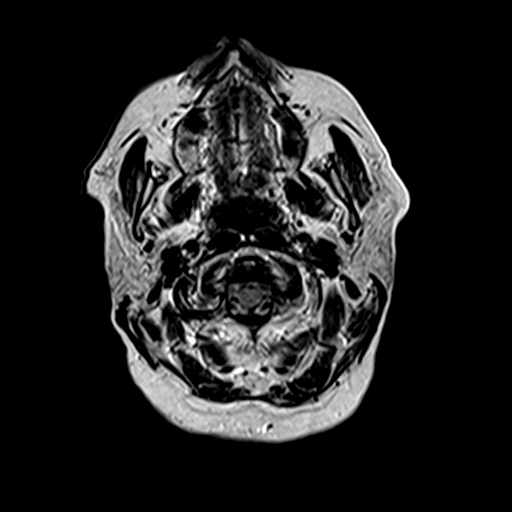
[im 16/32]
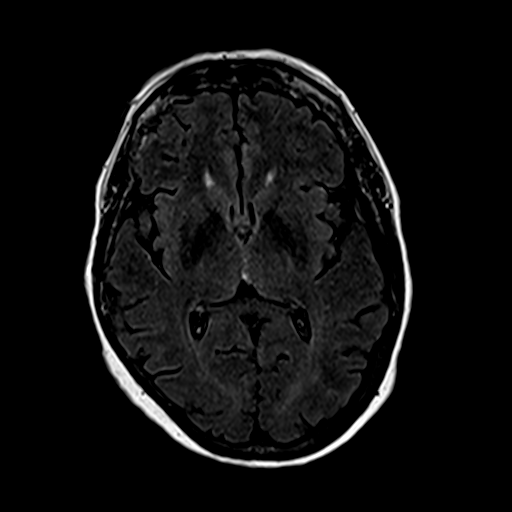
[im 32/32]
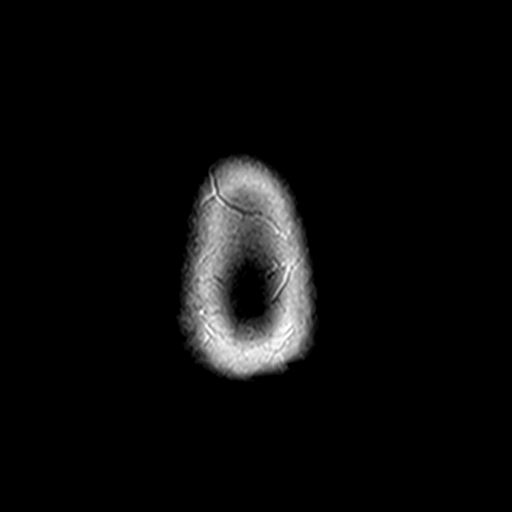

[Series 8: swi_images · axial · 2.0mm · 0.90mm/px · z∈[-30,+128]mm · 7 of 80 slices shown]
[im 1/80]
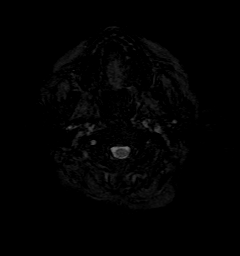
[im 14/80]
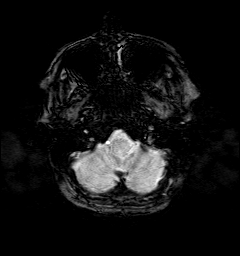
[im 27/80]
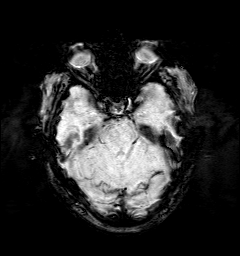
[im 40/80]
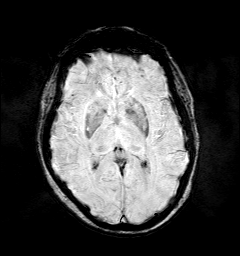
[im 53/80]
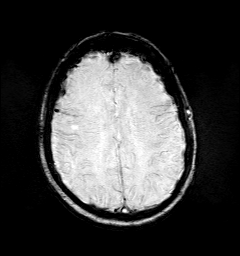
[im 66/80]
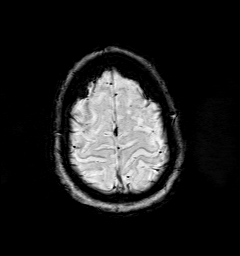
[im 80/80]
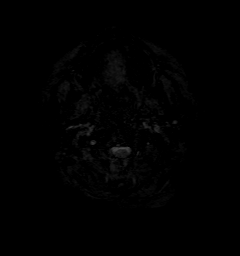

[Series 9: t1_mpr_tra · axial · 2.0mm · 0.45mm/px · z∈[-33,+44]mm · 4 of 80 slices shown]
[im 1/80]
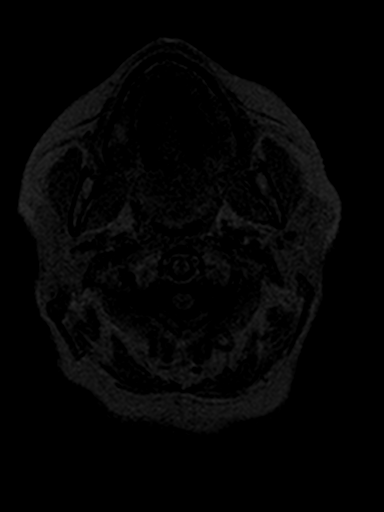
[im 14/80]
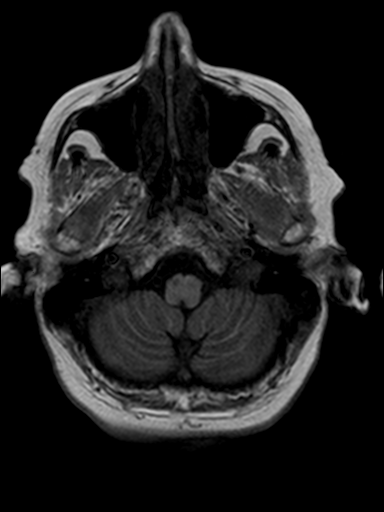
[im 27/80]
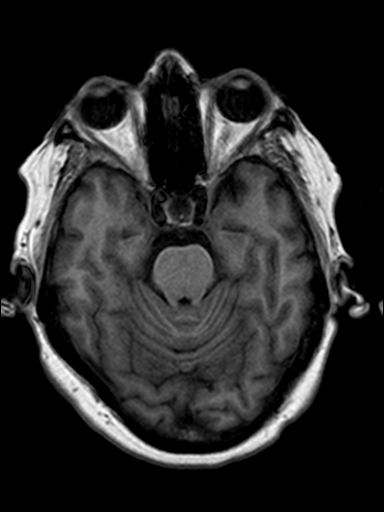
[im 40/80]
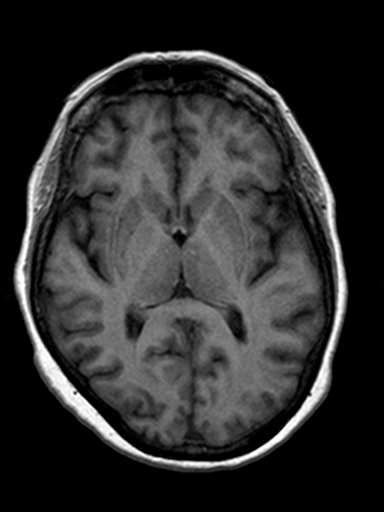

[Series 10: T2 · coronal · 3.0mm · 0.23mm/px · 2 of 28 slices shown (2 of 3)]
[im 1/28]
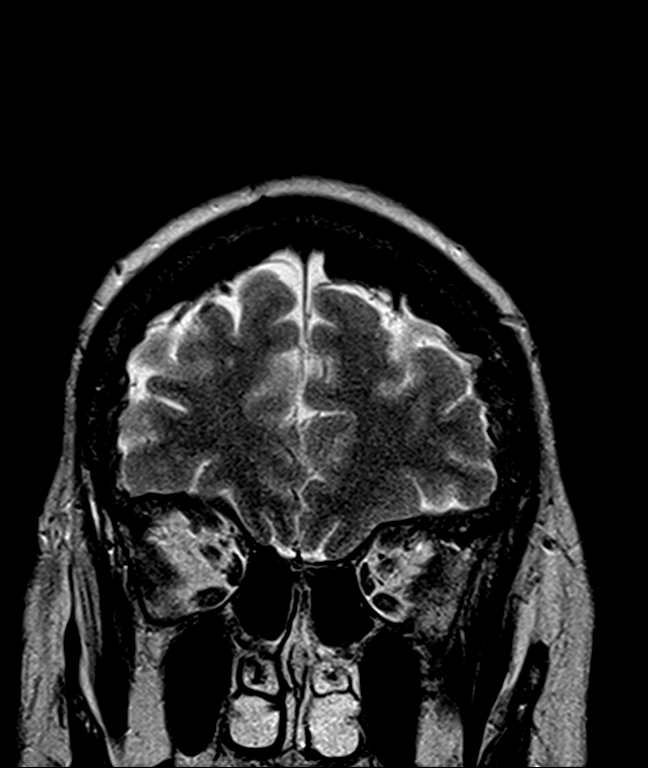
[im 28/28]
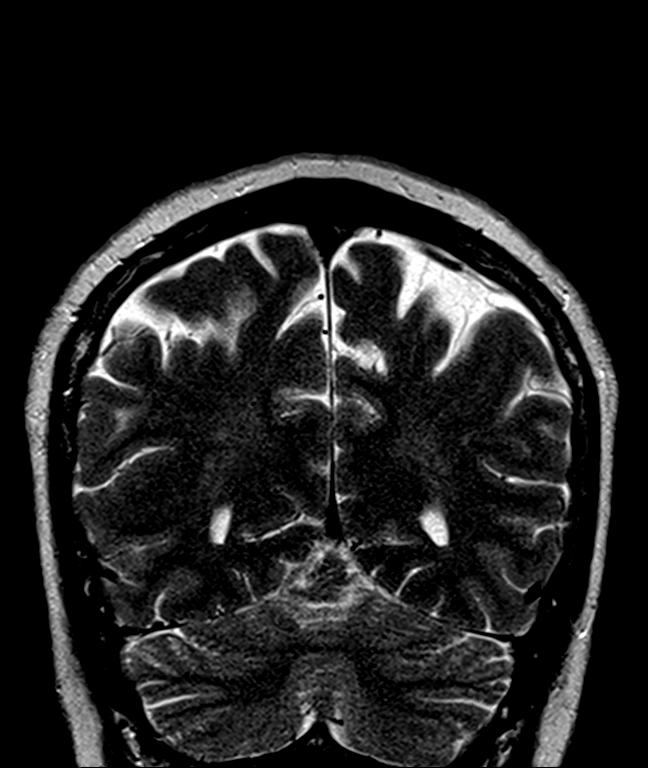

[Series 11: FLAIR · coronal · 3.0mm · 0.70mm/px · 2 of 28 slices shown (2 of 2)]
[im 1/28]
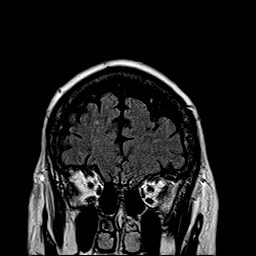
[im 28/28]
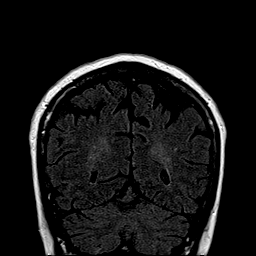

[Series 12: T2 · coronal · 5.0mm · 0.45mm/px · 2 of 28 slices shown (3 of 3)]
[im 1/28]
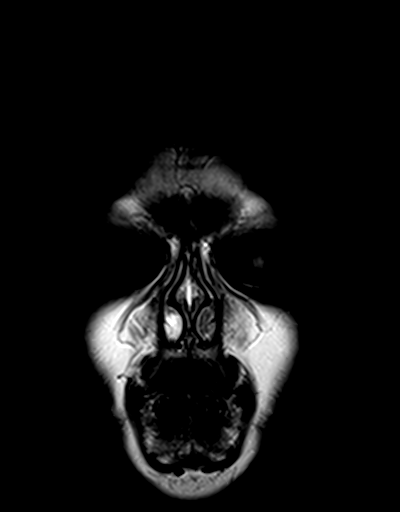
[im 28/28]
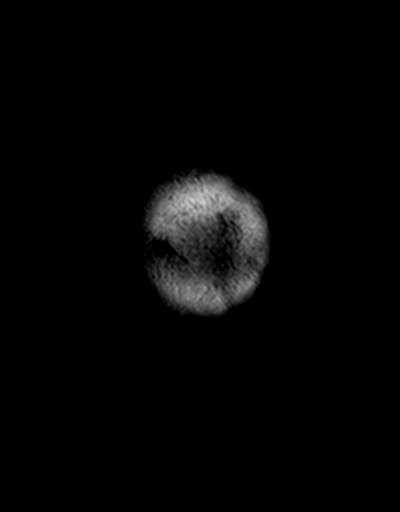

[36 of 48 positions shown; findings below may reference images not displayed]

FINDINGS: Brain:

Mild intermittent motion degradation.

Cerebral volume is normal for age.

Mild multifocal T2 FLAIR hyperintense signal abnormality within the
cerebral white matter, nonspecific but most often secondary to
chronic small vessel ischemia.

The hippocampi are symmetric in size and signal.

No cortical encephalomalacia is identified.

There is no acute infarct.

No evidence of an intracranial mass.

No chronic intracranial blood products.

No extra-axial fluid collection.

No midline shift.

Vascular: Maintained flow voids within the proximal large arterial
vessels. Aneurysm arising from the region of the anterior
communicating artery measuring at least 5 x 3.5 mm (series 10, image
20) (series 5, image 10) (series 13, image 34).

Skull and upper cervical spine: No focal suspicious marrow lesion.

Sinuses/Orbits: Visualized orbits show no acute finding. Minimal
bilateral ethmoid sinus mucosal thickening.

Other: Trace fluid within the bilateral mastoid air cells.

Impression #5 will be called to the ordering clinician or
representative by the Radiologist Assistant, and communication
documented in the PACS or [REDACTED].
IMPRESSION: Mildly motion degraded exam.

No evidence of acute intracranial abnormality.

No specific seizure focus is identified.

Mild multifocal T2 FLAIR hyperintense signal abnormality within the
cerebral white matter, nonspecific but most often secondary to
chronic small vessel ischemia.

Saccular aneurysm arising from the region of the anterior
communicating artery measuring at least 5 x 3.5 mm. MR or CT
angiography recommended for further evaluation. Additionally,
neuro-interventional consultation is recommended.

Trace bilateral mastoid effusions.

## 2021-05-11 MED ORDER — GADOBENATE DIMEGLUMINE 529 MG/ML IV SOLN
14.0000 mL | Freq: Once | INTRAVENOUS | Status: AC | PRN
  Administered 2021-05-11: 14 mL via INTRAVENOUS

## 2021-05-11 NOTE — Telephone Encounter (Signed)
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 MR or CT angiography recommended for further evaluation. Additionally, neuro-interventional consultation is recommended.

## 2021-05-11 NOTE — Telephone Encounter (Signed)
Orders placed in epice referral for Dr Corliss Skains and MRA of head with out contrast

## 2021-05-11 NOTE — Telephone Encounter (Signed)
Reviewed MRI. Tried to call patient's number but mailbox is full. Called son [name] and discussed MRI results showing small aneurysm, no tumor, stroke, or bleed. Discussed recommendation for MRA head without contrast and referral to Neuro-interventional specialist Dr. Kerby Nora. Discussed control of BP, she does not smoke.   Heather, Pls send referral to Dr. Corliss Skains and order MRA head without contrast. Thanks

## 2021-05-12 ENCOUNTER — Telehealth: Payer: Self-pay | Admitting: Neurology

## 2021-05-12 NOTE — Telephone Encounter (Signed)
Patient is returning Dr Karel Jarvis Call about her MRI results

## 2021-05-12 NOTE — Telephone Encounter (Signed)
Pt said she is returning a call to [facility] regarding results

## 2021-05-13 NOTE — Telephone Encounter (Signed)
See other phone note

## 2021-05-13 NOTE — Telephone Encounter (Signed)
See other phone note, I spoke to her son about the results. Can read note to her and recommendations. Thanks

## 2021-05-13 NOTE — Telephone Encounter (Signed)
I think you called her? I already spoke to her son, we were going to do tests, I think that is the call she is returning?

## 2021-05-13 NOTE — Telephone Encounter (Signed)
Pt called and informed that MRI results showing small aneurysm, no tumor, stroke, or bleed. Discussed recommendation for MRA head without contrast and referral to Neuro-interventional specialist Dr. Kerby Nora. Discussed control of BP, she does not smoke. Pt stated that her son did not understand when Dr Karel Jarvis was talking to him. Pt advised that referral was placed to Dr Corliss Skains and for MRA

## 2021-05-25 ENCOUNTER — Ambulatory Visit
Admission: RE | Admit: 2021-05-25 | Discharge: 2021-05-25 | Disposition: A | Discharge status: Home / Self Care | Payer: 59 | Source: Ambulatory Visit | Attending: Neurology | Admitting: Neurology

## 2021-05-25 ENCOUNTER — Other Ambulatory Visit: Payer: Self-pay

## 2021-05-25 DIAGNOSIS — I729 Aneurysm of unspecified site: Secondary | ICD-10-CM

## 2021-05-25 IMAGING — MR MR MRA HEAD W/O CM
1 series · 11 of 48 positions shown · non-contrast
Comparison: Brain MRI [DATE].

CLINICAL DATA: Aneurysm.

EXAM:
MRA HEAD WITHOUT CONTRAST
TECHNIQUE: Angiographic images of the Circle of Willis were acquired using MRA
technique without intravenous contrast.

[Series 5: tof_fl3d_tra_p2_multi-slab · axial · 0.6mm · 0.26mm/px · z∈[-78,+15]mm · 11 of 175 slices shown]
[im 12/175]
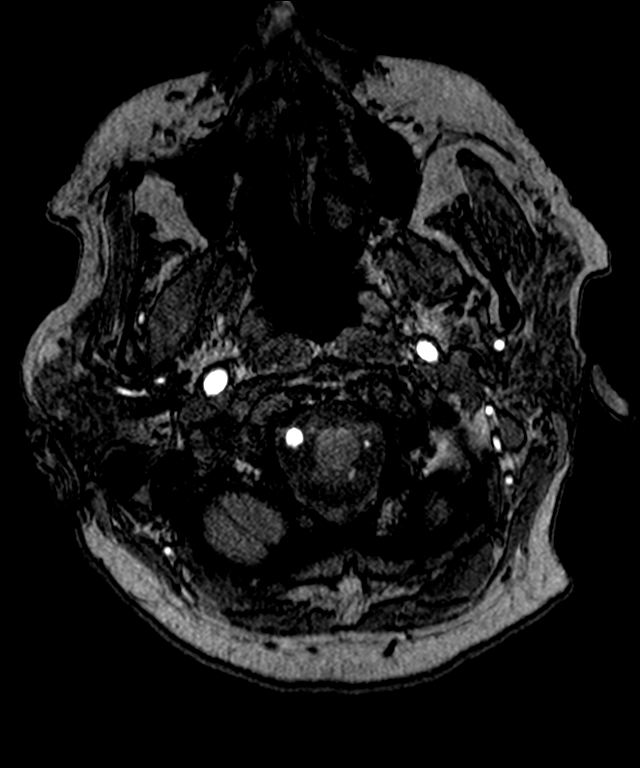
[im 30/175]
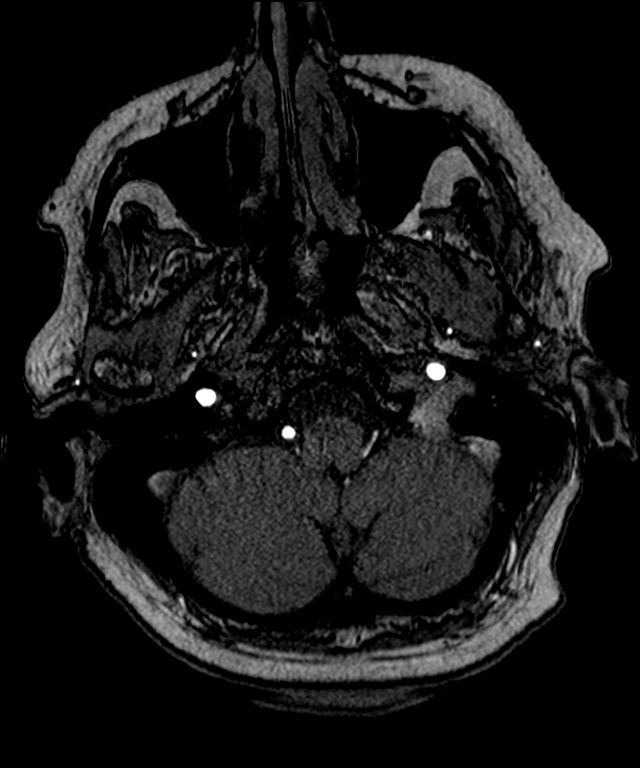
[im 34/175]
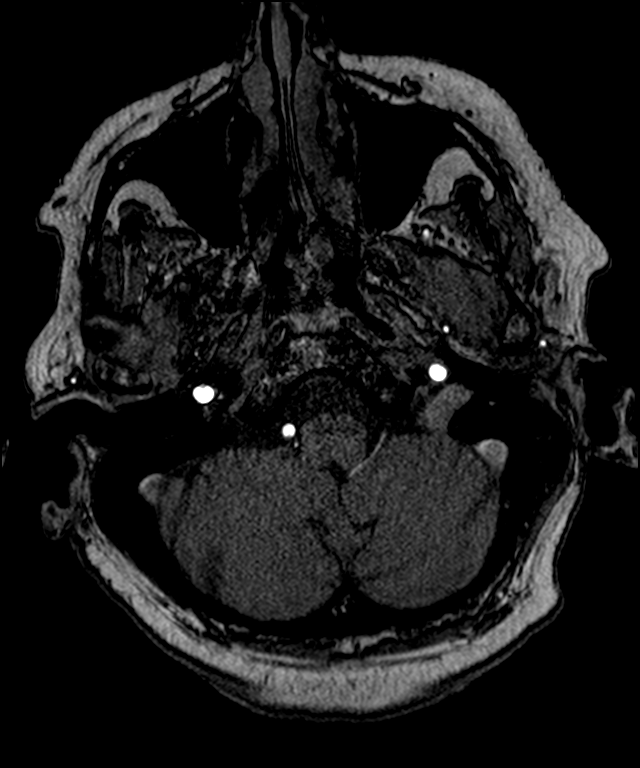
[im 56/175]
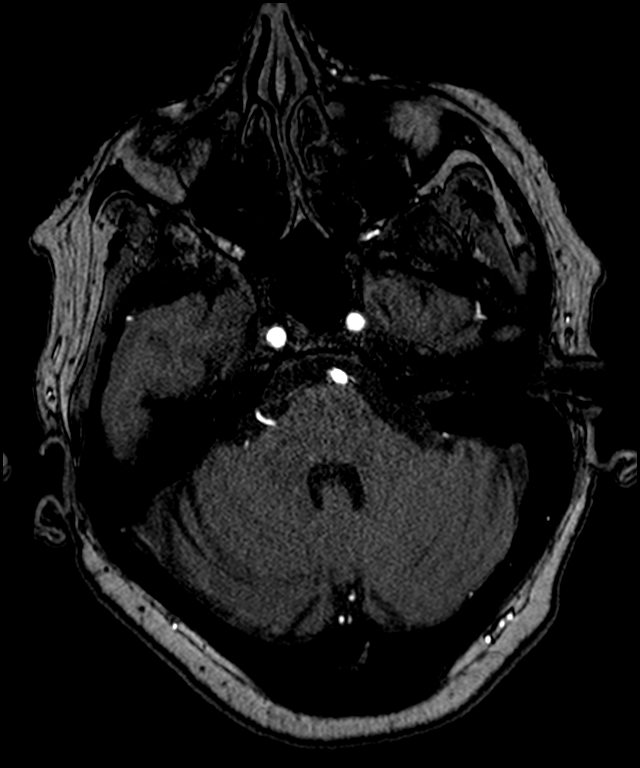
[im 78/175]
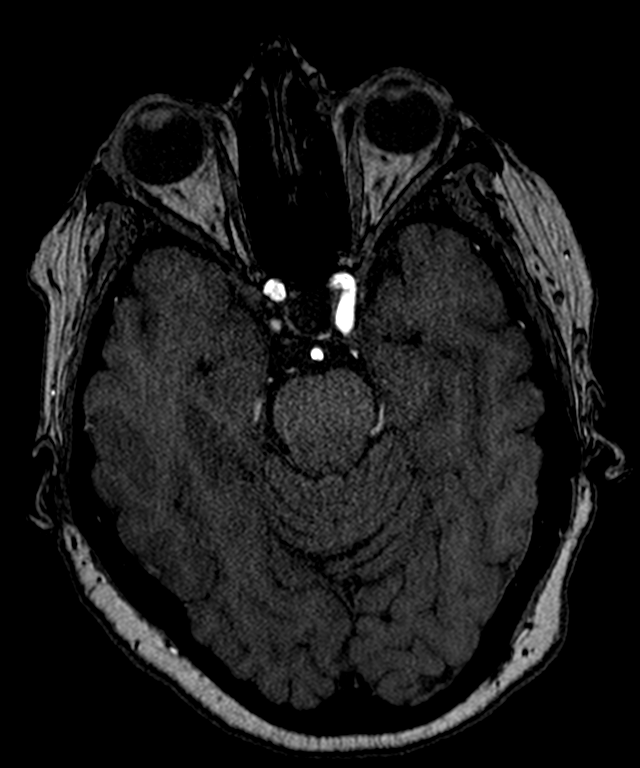
[im 89/175]
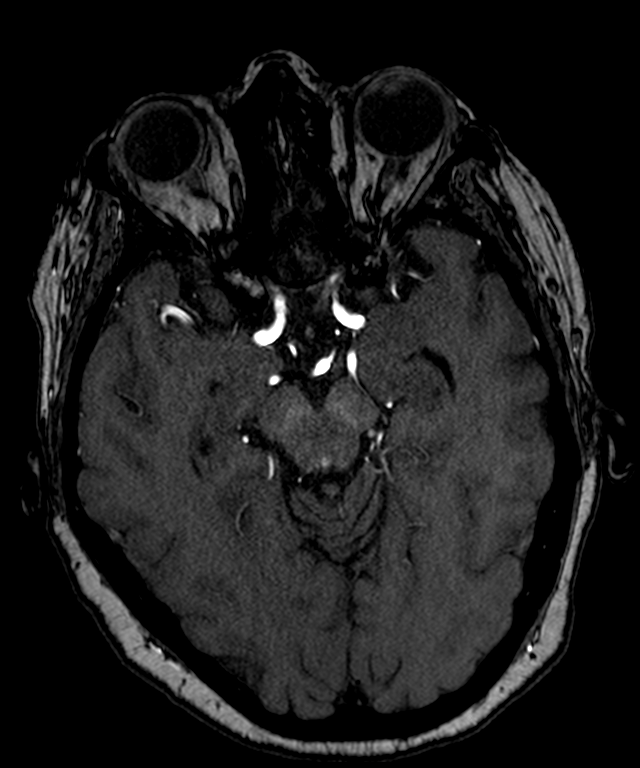
[im 100/175]
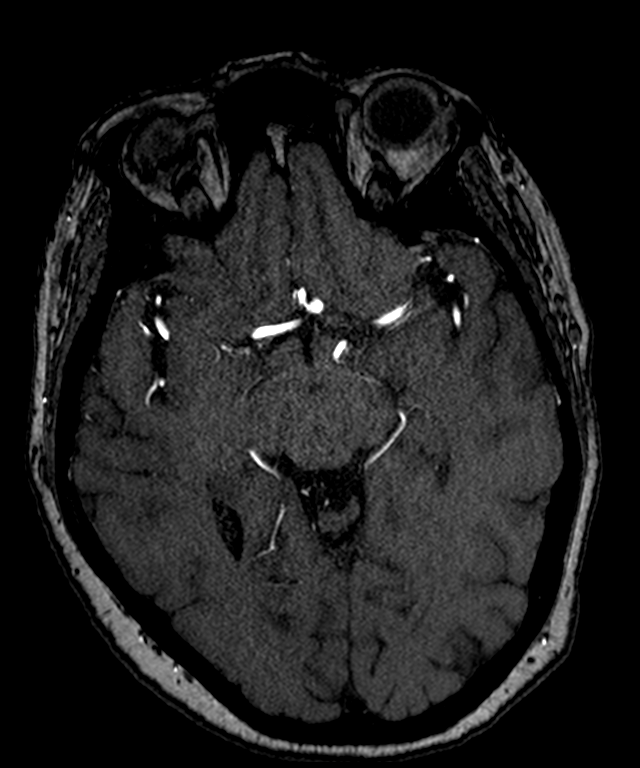
[im 123/175]
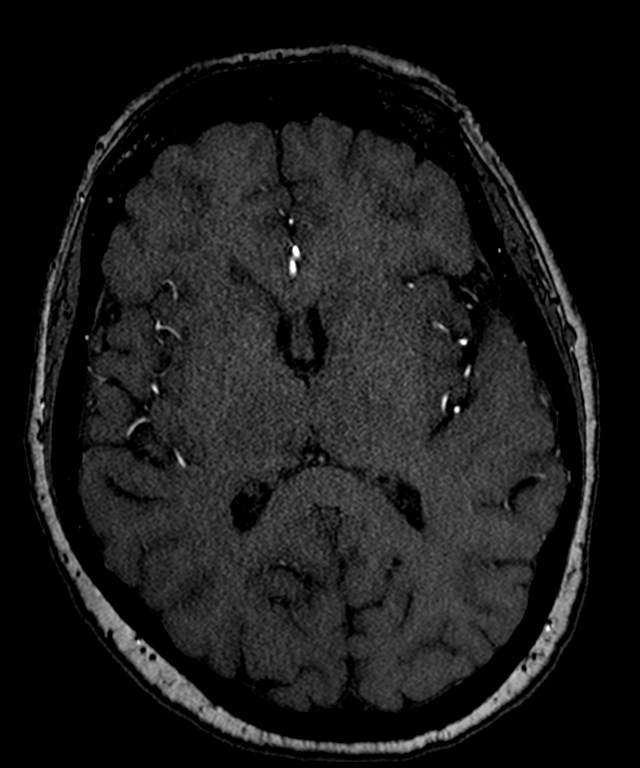
[im 145/175]
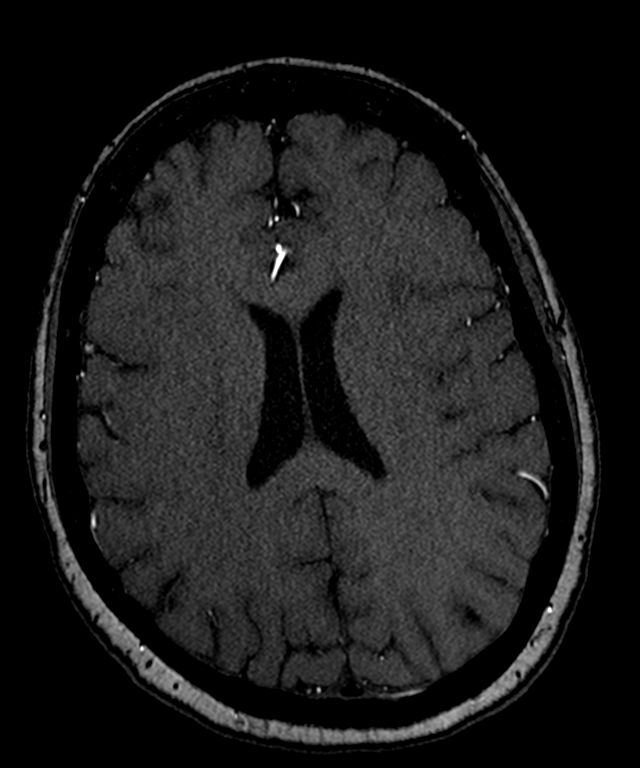
[im 149/175]
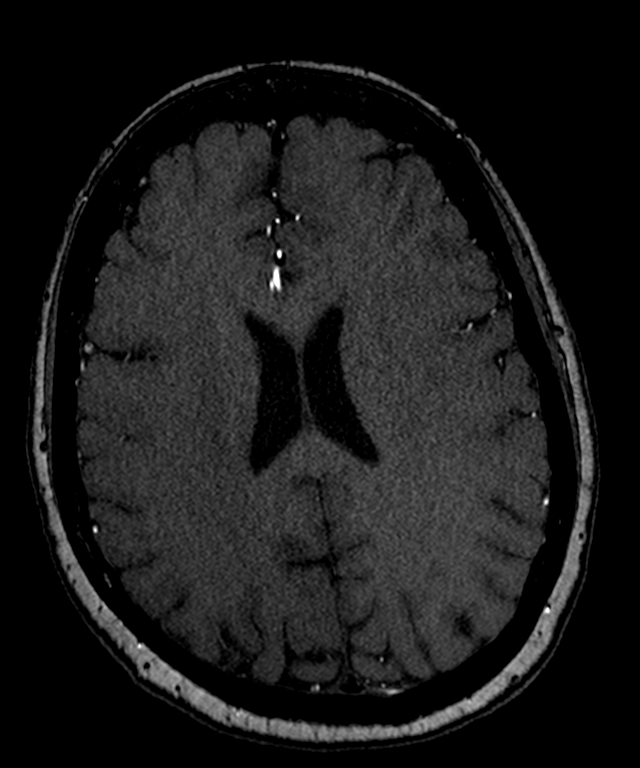
[im 167/175]
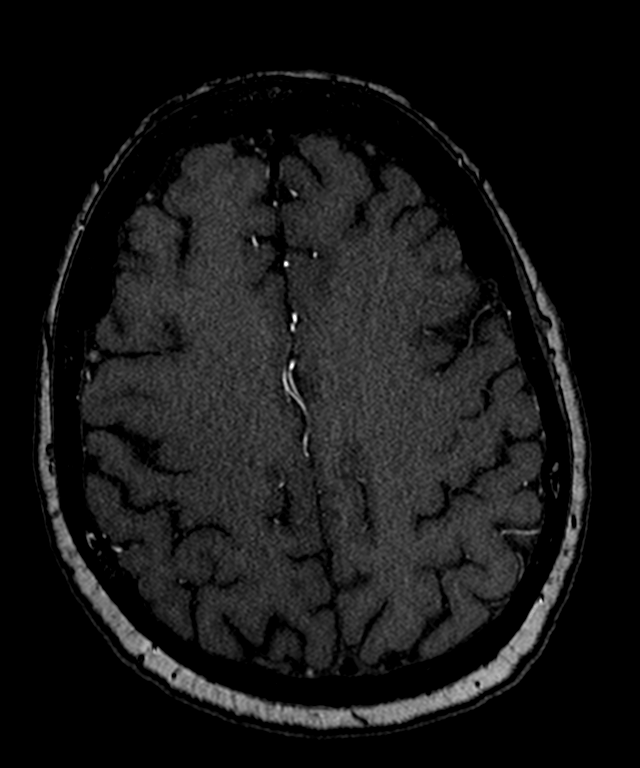

[11 of 48 positions shown; findings below may reference images not displayed]

FINDINGS: Anterior circulation:

The intracranial internal carotid arteries are patent. The M1 middle
cerebral arteries are patent. No M2 proximal branch occlusion or
high-grade proximal stenosis is identified. The anterior cerebral
arteries are patent. No intracranial aneurysm is identified.

Confirmed bilobed aneurysm arising from the region of the anterior
communicating artery and proximal A2 right anterior cerebral artery,
measuring 7 x 4 mm (for instance as seen on series 5, image 101)
(series 107, image 134).

Posterior circulation:

The non-dominant left vertebral artery terminates as the left PICA.
The right vertebral artery is dominant and patent intracranially
without stenosis. The basilar artery is patent. The posterior
cerebral arteries are patent. Posterior communicating arteries are
present bilaterally.

Anatomic variants: None significant.
IMPRESSION: Confirmed bilobed aneurysm arising from the region of the anterior
communicating artery and proximal A2 right anterior cerebral artery,
measuring 7 x 4 mm. Neuro-interventional consultation is
recommended.

No intracranial large vessel occlusion or proximal high-grade
arterial stenosis.

## 2021-05-27 ENCOUNTER — Telehealth: Payer: Self-pay | Admitting: Neurology

## 2021-05-27 NOTE — Telephone Encounter (Signed)
Pt would like a call to discuss results

## 2021-06-01 NOTE — Telephone Encounter (Signed)
Pls let her know the MRA confirmed the aneurysm seen on the last MRI. Recommendation is to see the Neurointerventional specialist, Dr. Corliss Skains, can you pls give her his number to schedule appt if she has not heard from his office? thanks

## 2021-06-01 NOTE — Telephone Encounter (Signed)
Pt called today and said she had another test done on the 19th and needs results from that. MRA and aquino set this up

## 2021-06-02 NOTE — Telephone Encounter (Signed)
Pt called to go over MRA results no answer left a voice mail to call office back. Dr Corliss Skains number is [phone] for pt to call to set up appointment,

## 2021-06-02 NOTE — Telephone Encounter (Signed)
Pt called and informed that MRA confirmed the aneurysm seen on the last MRI. Recommendation is to see the Neurointerventional specialist, Dr. Corliss Skains. Pt as given number to Dr Corliss Skains office

## 2021-06-06 ENCOUNTER — Other Ambulatory Visit (HOSPITAL_COMMUNITY): Payer: Self-pay | Admitting: Interventional Radiology

## 2021-06-06 DIAGNOSIS — I671 Cerebral aneurysm, nonruptured: Secondary | ICD-10-CM

## 2021-06-15 ENCOUNTER — Ambulatory Visit (HOSPITAL_COMMUNITY)
Admission: RE | Admit: 2021-06-15 | Discharge: 2021-06-15 | Disposition: A | Discharge status: Home / Self Care | Payer: 59 | Source: Ambulatory Visit | Attending: Interventional Radiology | Admitting: Interventional Radiology

## 2021-06-15 ENCOUNTER — Other Ambulatory Visit: Payer: Self-pay

## 2021-06-15 DIAGNOSIS — I671 Cerebral aneurysm, nonruptured: Secondary | ICD-10-CM

## 2021-06-16 HISTORY — PX: IR RADIOLOGIST EVAL & MGMT: IMG5224

## 2021-06-22 ENCOUNTER — Other Ambulatory Visit (HOSPITAL_COMMUNITY): Payer: Self-pay | Admitting: Interventional Radiology

## 2021-06-22 DIAGNOSIS — I671 Cerebral aneurysm, nonruptured: Secondary | ICD-10-CM

## 2021-07-04 ENCOUNTER — Other Ambulatory Visit (HOSPITAL_COMMUNITY): Payer: Self-pay | Admitting: Physician Assistant

## 2021-07-05 ENCOUNTER — Other Ambulatory Visit (HOSPITAL_COMMUNITY): Payer: Self-pay | Admitting: Interventional Radiology

## 2021-07-05 ENCOUNTER — Other Ambulatory Visit: Payer: Self-pay

## 2021-07-05 ENCOUNTER — Ambulatory Visit (HOSPITAL_COMMUNITY)
Admission: RE | Admit: 2021-07-05 | Discharge: 2021-07-05 | Disposition: A | Discharge status: Home / Self Care | Payer: 59 | Source: Ambulatory Visit | Attending: Interventional Radiology | Admitting: Interventional Radiology

## 2021-07-05 DIAGNOSIS — I10 Essential (primary) hypertension: Secondary | ICD-10-CM | POA: Diagnosis not present

## 2021-07-05 DIAGNOSIS — G40909 Epilepsy, unspecified, not intractable, without status epilepticus: Secondary | ICD-10-CM | POA: Diagnosis not present

## 2021-07-05 DIAGNOSIS — I671 Cerebral aneurysm, nonruptured: Secondary | ICD-10-CM

## 2021-07-05 DIAGNOSIS — Z951 Presence of aortocoronary bypass graft: Secondary | ICD-10-CM | POA: Insufficient documentation

## 2021-07-05 DIAGNOSIS — Z7982 Long term (current) use of aspirin: Secondary | ICD-10-CM | POA: Insufficient documentation

## 2021-07-05 DIAGNOSIS — E785 Hyperlipidemia, unspecified: Secondary | ICD-10-CM | POA: Diagnosis not present

## 2021-07-05 DIAGNOSIS — I2510 Atherosclerotic heart disease of native coronary artery without angina pectoris: Secondary | ICD-10-CM | POA: Diagnosis not present

## 2021-07-05 HISTORY — PX: IR ANGIO VERTEBRAL SEL SUBCLAVIAN INNOMINATE BILAT MOD SED: IMG5366

## 2021-07-05 HISTORY — PX: IR 3D INDEPENDENT WKST: IMG2385

## 2021-07-05 HISTORY — PX: IR ANGIO INTRA EXTRACRAN SEL COM CAROTID INNOMINATE BILAT MOD SED: IMG5360

## 2021-07-05 LAB — BASIC METABOLIC PANEL
Anion gap: 7 (ref 5–15)
BUN: 15 mg/dL (ref 8–23)
CO2: 26 mmol/L (ref 22–32)
Calcium: 8.8 mg/dL — ABNORMAL LOW (ref 8.9–10.3)
Chloride: 105 mmol/L (ref 98–111)
Creatinine, Ser: 0.95 mg/dL (ref 0.44–1.00)
GFR, Estimated: >60 mL/min (ref >60)
Glucose, Bld: 107 mg/dL — ABNORMAL HIGH (ref 70–99)
Potassium: 3.8 mmol/L (ref 3.5–5.1)
Sodium: 138 mmol/L (ref 135–145)

## 2021-07-05 LAB — CBC
HCT: 37.3 % (ref 36.0–46.0)
Hemoglobin: 12.3 g/dL (ref 12.0–15.0)
MCH: 33.4 pg (ref 26.0–34.0)
MCHC: 33 g/dL (ref 30.0–36.0)
MCV: 101.4 fL — ABNORMAL HIGH (ref 80.0–100.0)
Platelets: 303 10*3/uL (ref 150–400)
RBC: 3.68 MIL/uL — ABNORMAL LOW (ref 3.87–5.11)
RDW: 13.3 % (ref 11.5–15.5)
WBC: 7.9 10*3/uL (ref 4.0–10.5)
nRBC: 0 % (ref 0.0–0.2)

## 2021-07-05 LAB — PROTIME-INR
INR: 0.9 (ref 0.8–1.2)
Prothrombin Time: 11.6 seconds (ref 11.4–15.2)

## 2021-07-05 IMAGING — US IR ANGIO INTRA EXTRACRAN SEL COM CAROTID INNOMINATE BILAT MOD SE
1 of 2 series · 12 of 24 positions shown · non-contrast
Comparison: MRI MRA of the brain [DATE].

CLINICAL DATA: History of seizures and headaches. Recent discovery
of intracranial aneurysm on MRA of the brain [DATE].

EXAM:
BILATERAL COMMON CAROTID AND INNOMINATE ANGIOGRAPHY
TECHNIQUE: Informed written consent was obtained from the patient after a
thorough discussion of the procedural risks, benefits and
alternatives. All questions were addressed. Maximal Sterile Barrier
Technique was utilized including caps, mask, sterile gowns, sterile
gloves, sterile drape, hand hygiene and skin antiseptic. A timeout
was performed prior to the initiation of the procedure.

[Series 300: ir angio intra extracran sel com carotid · 12 of 203 slices shown]
[im 1/203]
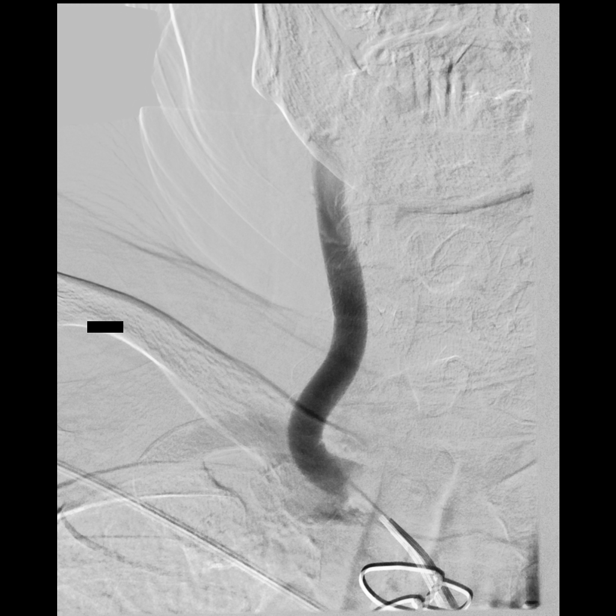
[im 19/203]
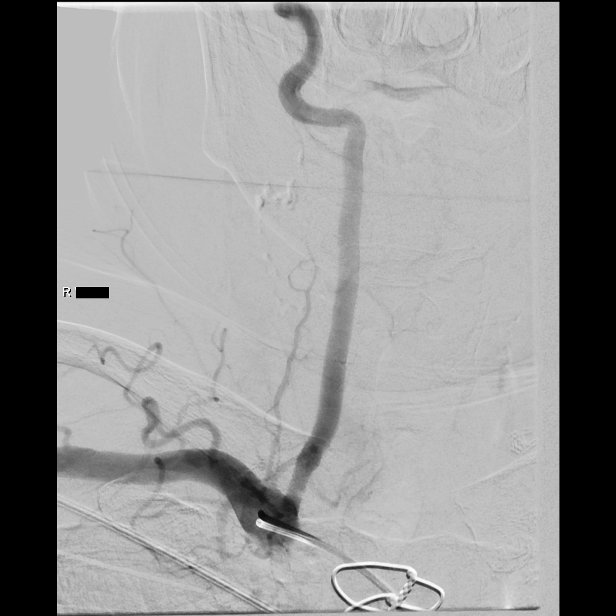
[im 37/203]
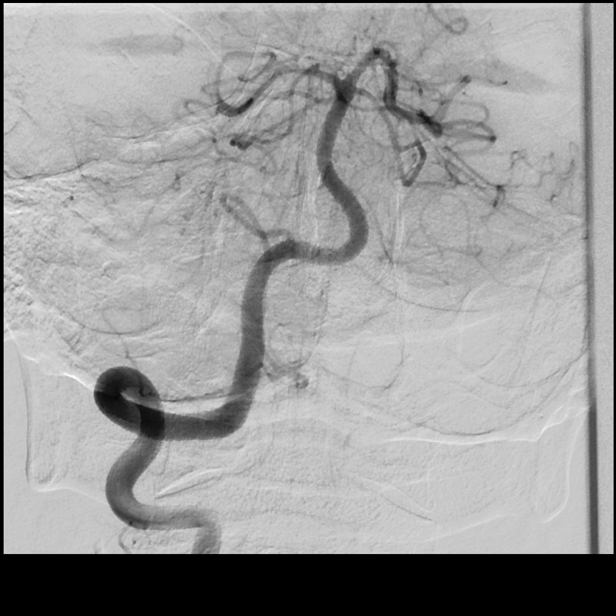
[im 56/203]
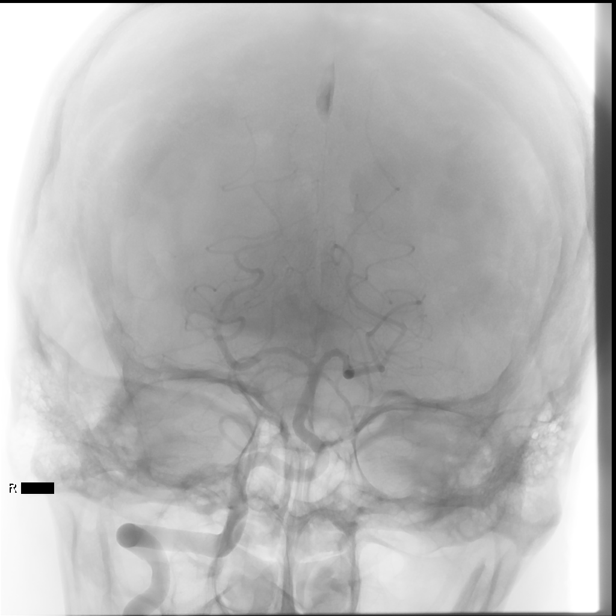
[im 74/203]
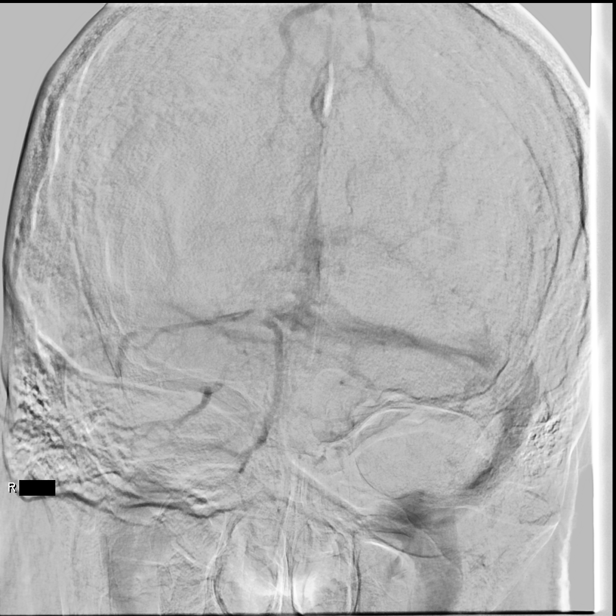
[im 92/203]
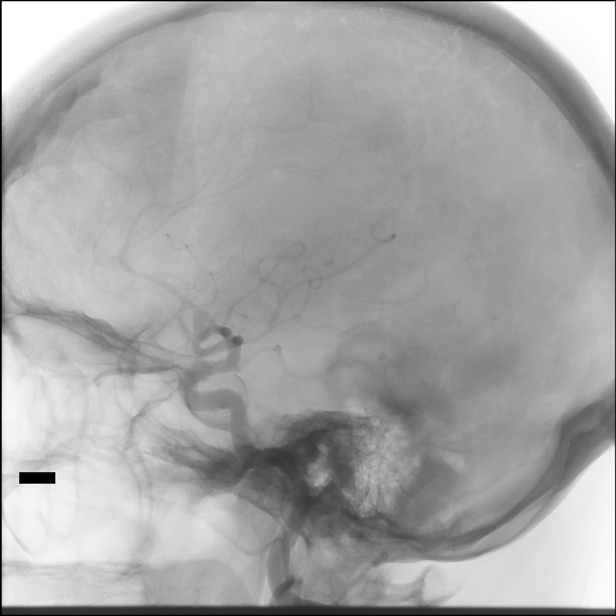
[im 111/203]
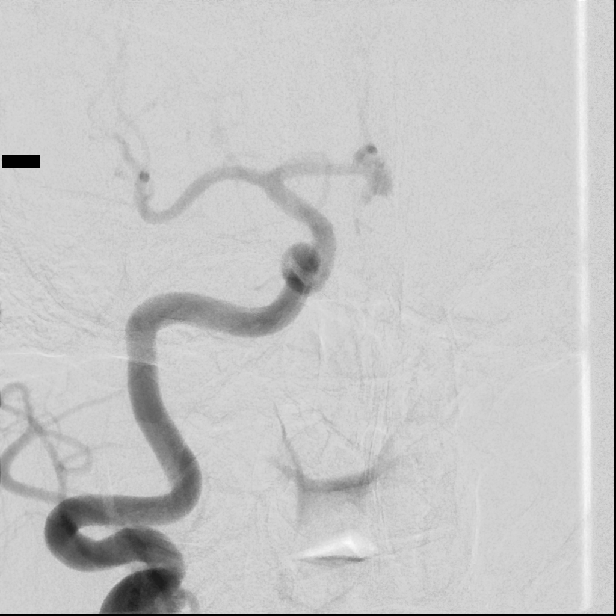
[im 129/203]
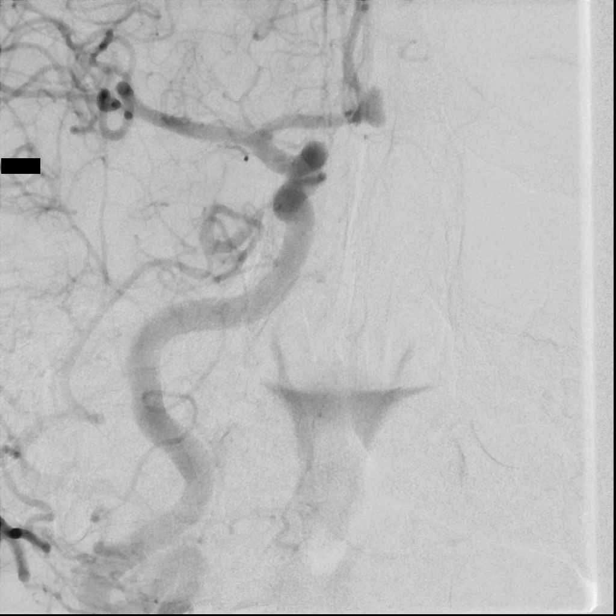
[im 147/203]
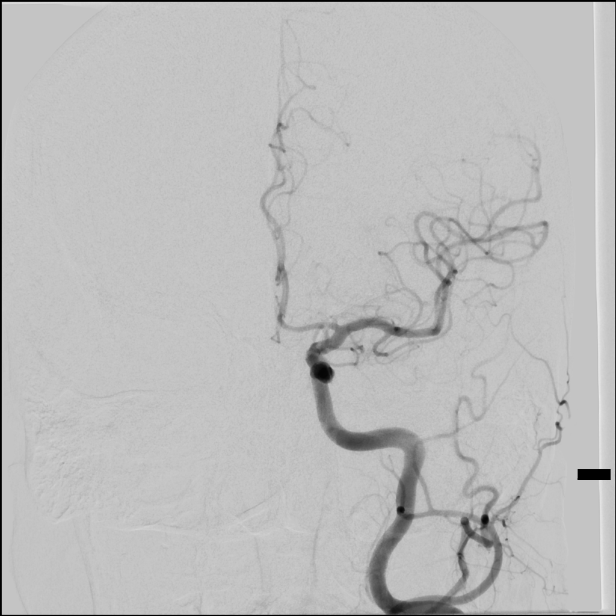
[im 166/203]
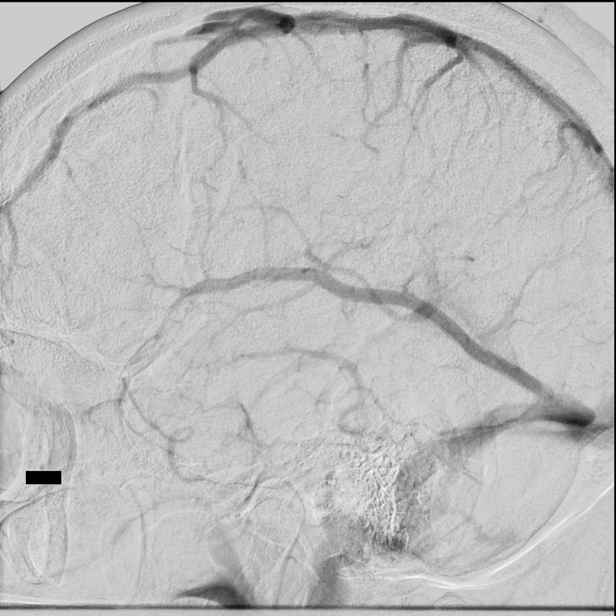
[im 184/203]
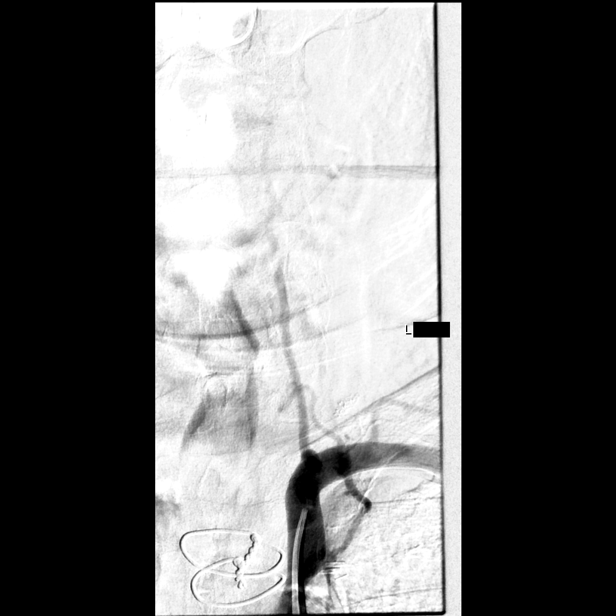
[im 203/203]
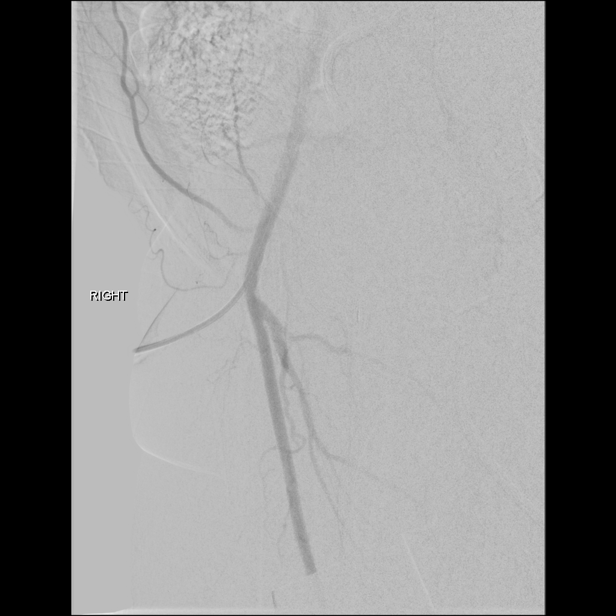

[12 of 24 positions shown; findings below may reference images not displayed]

MEDICATIONS:
Heparin [I7] units IV. None antibiotic was administered within 1
hour of the procedure.

ANESTHESIA/SEDATION:
Versed 1 mg IV; Fentanyl 50 mcg IV

Moderate Sedation Time:  42 minutes

The patient was continuously monitored during the procedure by the
interventional radiology nurse under my direct supervision.

CONTRAST:  Omnipaque 300 approximately 60 mL.

FLUOROSCOPY TIME:  Fluoroscopy Time: 8 minutes 48 seconds ([I7]
mGy).

COMPLICATIONS:
None immediate.
The right groin was prepped and draped in the usual sterile fashion.
Thereafter using modified Seldinger technique, transfemoral access
into the right common femoral artery was obtained without
difficulty. Over a 0.035 inch guidewire, a 5 French Pinnacle sheath
was inserted. Through this, and also over 0.035 inch guidewire, a 5
French JB 1 catheter was advanced to the aortic arch region and
selectively positioned in the right common carotid artery, the right
vertebral artery, the left common carotid artery and the left
subclavian artery. Also performed was a 3D rotational arteriogram of
the right anterior circulation via injection in the right common
carotid artery. Reformations were performed on a separate
workstation.
FINDINGS: The innominate arteriogram demonstrates the proximal right common
carotid artery and the right subclavian arteries to be widely
patent.

Right common carotid arteriogram demonstrates mild circumferential
atherosclerotic narrowing of the right common carotid artery just
proximal to the bifurcation.

The right external carotid artery demonstrates high-grade stenosis
at its origin.

Its branches, however, opacify widely.

Right internal carotid artery at the bulb is widely patent.

There is a U-shaped configuration of the proximal [DATE] of the right
internal carotid artery without evidence of kinking.

More distally, the right internal carotid artery opacifies widely to
the cranial skull base. The petrous, the cavernous and the
supraclinoid segments are widely patent.

Right posterior communicating artery is seen opacifying transiently
the right posterior cerebral distribution.

The right middle cerebral artery and the right anterior cerebral
artery opacify into the capillary and venous phases.

Arising in the anterior communicating artery region is a saccular
aneurysm projecting medially, and slightly superiorly. A 3D
rotational arteriogram with reformations demonstrates this to be an
approximately 6.6 mm x 4.8 mm slightly lobulated anterior
communicating artery aneurysm. The A1 A2 junctions bilaterally do
not involve the fundus of the aneurysm.

The left common carotid arteriogram demonstrates the left external
carotid artery and its major branches to be widely patent.

The left internal carotid artery at the bulb to the cranial skull
base is widely patent.

The petrous, the cavernous and the supraclinoid segments are widely
patent with a mild stenosis of the distal cavernous segment due to a
soft atherosclerotic plaque.

Flash filling of the left posterior communicating artery is noted.

The left middle cerebral artery and the left anterior cerebral
artery opacify into the capillary and venous phases. The left
subclavian arteriogram demonstrates the origin of the non dominant
left vertebral artery to be widely patent.

The vessel is seen to opacify to the cranial skull base where it
appears to end in the ipsilateral left posterior-inferior cerebellar
artery.
IMPRESSION: Approximately 6.6 mm x 4.8 mm mildly lobulated anterior
communicating artery aneurysm as described above.

PLAN:
Findings reviewed with the patient and the son. Management options
were again discussed. Option of endovascular treatment was discussed
in detail. The procedure would be under general anesthesia via a
right common femoral or radial approach.

The risk of the procedure of 1% chance of a complication,
thromboembolic stroke and remotely of intraprocedural rupture with
potential for fatality reviewed.

The patient and the son would like to proceed with endovascular
treatment. This will be scheduled as soon as possible. Patient would
have to be started on dual antiplatelets seven days to 10 days prior
to the procedure.

## 2021-07-05 MED ORDER — FENTANYL CITRATE (PF) 100 MCG/2ML IJ SOLN
INTRAMUSCULAR | Status: AC | PRN
  Administered 2021-07-05: 25 ug via INTRAVENOUS

## 2021-07-05 MED ORDER — SODIUM CHLORIDE 0.9 % IV SOLN: INTRAVENOUS | Status: AC

## 2021-07-05 MED ORDER — IOHEXOL 350 MG/ML SOLN
100.0000 mL | Freq: Once | INTRAVENOUS | Status: AC | PRN
  Administered 2021-07-05: 30 mL via INTRA_ARTERIAL

## 2021-07-05 MED ORDER — SODIUM CHLORIDE 0.9 % IV SOLN: Freq: Once | INTRAVENOUS | Status: AC

## 2021-07-05 MED ORDER — HEPARIN SODIUM (PORCINE) 1000 UNIT/ML IJ SOLN
INTRAMUSCULAR | Status: AC | PRN
  Administered 2021-07-05: 1000 [IU] via INTRAVENOUS

## 2021-07-05 MED ORDER — MIDAZOLAM HCL 2 MG/2ML IJ SOLN
INTRAMUSCULAR | Status: AC | PRN
  Administered 2021-07-05: 1 mg via INTRAVENOUS

## 2021-07-05 NOTE — Sedation Documentation (Signed)
ETCO2/nasal canula removed per Dr. Deveshwar request. Patient 100% on room air.  

## 2021-07-05 NOTE — H&P (Signed)
Chief Complaint: Evaluation of cerebral aneurysm. Request is cerebral angiogram  Referring Physician(s): Deveshwar,Sanjeev  Supervising Physician: Luanne Bras  Patient Status: [facility] - Out-pt  History of Present Illness: Teresa [name] is a 60 y.o. female  History of CAD s/p CABG, HTN, HLD, and seizure disorder. 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 MR angio head from 10.19.22 reads confirmed bolobed aneurysm arising from the region of the anterior communicating artery and proximal A 2 right anterior cerebral artery, measuring 7X 4 mm. Patient was seen in [facility] clinic with Dr. Estanislado Pandy on 11.10.22. The patient was given several different treatment options and the patient decide to peruse. cerebral angiogram. Patient presents for cerebral angiogram.  Patient is on 325 mg of ASA. All labs and mediations are within acceptable parameters. NK DA. Patient has been NPO since midnight.   Currently without any significant complaints. Patient alert and laying in bed, calm and comfortable.Endorses bilateral knee pain that she states is arthritis related. Denies any fevers, headache, chest pain, SOB, cough, abdominal pain, nausea, vomiting or bleeding. Return precautions and treatment recommendations and follow-up discussed with the patient who is agreeable with the plan.     Past Medical History:  Diagnosis Date   Acute on chronic respiratory failure with hypoxia (HCC)    Acute renal failure due to tubular necrosis [facility])    Atrial flutter (HCC)    Cardiogenic shock (HCC)    Coronary artery disease 08/13/2020   Coronary artery disease involving native coronary artery of native heart    Coronary artery disease involving native coronary artery of native heart without angina pectoris 09/17/2020   Depressed left ventricular ejection fraction 08/13/2020   Depression, unspecified 10/09/2015   Essential hypertension 10/06/2015   Gastro-esophageal reflux disease without  esophagitis 10/06/2015   HFrEF (heart failure with reduced ejection fraction) ([place]) 08/13/2020   History of tonic-clonic seizures 04/25/2020   Hyperlipidemia 10/06/2015   Ischemic cardiomyopathy 08/13/2020   Mixed hyperlipidemia 09/17/2020   Pericardial effusion 08/13/2020   Post operative atrial fibrillation ([place]) 08/13/2020   Seizure disorder ([place]) 03/03/2016   Shortness of breath 04/25/2020   Syncope 04/25/2020   Tobacco use 08/13/2020    Past Surgical History:  Procedure Laterality Date   AORTIC VALVE REPLACEMENT (AVR)/CORONARY ARTERY BYPASS GRAFTING (CABG)  04/26/2020   X4   APPENDECTOMY     IR RADIOLOGIST EVAL & MGMT  06/16/2021   TONSILLECTOMY     TOTAL ABDOMINAL HYSTERECTOMY     TOTAL HIP ARTHROPLASTY      Allergies: Patient has no known allergies.  Medications: Prior to Admission medications   Medication Sig Start Date End Date Taking? Authorizing Provider  acetaminophen (TYLENOL) 500 MG tablet Take 1,000 mg by mouth every 6 (six) hours as needed for moderate pain or mild pain.   Yes [provider]  Aspirin 325 MG CAPS Take 325 tablets by mouth daily. 06/02/20  Yes [provider]  atorvastatin (LIPITOR) 40 MG tablet Take 40 mg by mouth daily. 03/20/21  Yes [provider]  Cholecalciferol (VITAMIN D) 50 MCG (2000 UT) tablet Take 2,000 Units by mouth daily.   Yes [provider]  citalopram (CELEXA) 20 MG tablet Take 20 mg by mouth daily. 04/01/20  Yes [provider]  ezetimibe (ZETIA) 10 MG tablet Take 1 tablet (10 mg total) by mouth daily. 09/21/20  Yes Tobb, Kardie, DO  ferrous sulfate 324 MG TBEC Take 324 mg by mouth daily with breakfast.   Yes [provider]  furosemide (LASIX) 40 MG tablet Take 0.5 tablets (20 mg total) by mouth daily. Patient taking differently: Take 20 mg by mouth every other day. 02/10/21  Yes Tobb, Kardie, DO  levETIRAcetam (KEPPRA XR) 500 MG 24 hr tablet Take 3 tablets daily Patient taking differently:  500-1,000 mg See admin instructions. Take 1000 mg in the morning and 500 mg at bedtime 04/20/21  Yes Cameron Sprang, MD  meloxicam (MOBIC) 7.5 MG tablet Take 7.5 mg by mouth 2 (two) times daily as needed for pain. 02/28/21  Yes [provider]  metoprolol succinate (TOPROL-XL) 25 MG 24 hr tablet Take 0.5 tablets (12.5 mg total) by mouth daily. 02/10/21  Yes Tobb, Kardie, DO  Omega-3 Fatty Acids (FISH OIL) 1000 MG CAPS Take 1,000 mg by mouth daily.   Yes [provider]  pantoprazole (PROTONIX) 40 MG tablet Take 40 mg by mouth daily. 04/15/20  Yes [provider]  PREBIOTIC PRODUCT PO Take 1 tablet by mouth daily.   Yes [provider]  traMADol (ULTRAM) 50 MG tablet Take 50 mg by mouth 3 (three) times daily as needed for pain. 11/26/20  Yes [provider]  zolpidem (AMBIEN) 10 MG tablet Take 10 mg by mouth at bedtime as needed for sleep. 10/26/20  Yes [provider]  albuterol (VENTOLIN HFA) 108 (90 Base) MCG/ACT inhaler Inhale 1 puff into the lungs every 6 (six) hours as needed for wheezing or shortness of breath. 09/20/15   [provider]     Family History  Problem Relation Age of Onset   Hypertension Mother    Hypertension Father    Hypertension Sister    Diabetes Mellitus I Sister    Hypertension Brother    Diabetes Paternal Grandmother     Social History   Socioeconomic History   Marital status: Married    Spouse name: Not on file   Number of children: Not on file   Years of education: Not on file   Highest education level: Not on file  Occupational History   Not on file  Tobacco Use   Smoking status: Former    Packs/day: 1.50    Types: Cigarettes    Quit date: 04/07/2020    Years since quitting: 1.2   Smokeless tobacco: Never  Vaping Use   Vaping Use: Never used  Substance and Sexual Activity   Alcohol use: Never   Drug use: Never   Sexual activity: Not on file  Other Topics Concern   Not on file  Social  History Narrative   Right handed    Lives with sister    Social Determinants of Health   Financial Resource Strain: Not on file  Food Insecurity: Not on file  Transportation Needs: Not on file  Physical Activity: Not on file  Stress: Not on file  Social Connections: Not on file     Review of Systems: A 12 point ROS discussed and pertinent positives are indicated in the HPI above.  All other systems are negative.  Review of Systems  Constitutional:  Negative for fatigue and fever.  HENT:  Negative for congestion.   Respiratory:  Negative for cough and shortness of breath.   Gastrointestinal:  Negative for abdominal pain, diarrhea, nausea and vomiting.  Musculoskeletal:  Positive for arthralgias (bilateral knee).   Vital Signs: BP 126/79   Pulse 66   Temp 97.6 F (36.4 C) (Oral)   Resp 15   Ht 4\' 11"  (1.499 m)   Wt  175 lb (79.4 kg)   SpO2 96%   BMI 35.35 kg/m   Physical Exam Vitals and nursing note reviewed.  Constitutional:      Appearance: She is well-developed.  HENT:     Head: Normocephalic and atraumatic.  Eyes:     Conjunctiva/sclera: Conjunctivae normal.  Cardiovascular:     Rate and Rhythm: Normal rate and regular rhythm.     Heart sounds: Normal heart sounds.  Pulmonary:     Effort: Pulmonary effort is normal.     Breath sounds: Normal breath sounds.  Musculoskeletal:     Cervical back: Normal range of motion.  Neurological:     Mental Status: She is alert and oriented to person, place, and time.    Imaging: IR Radiologist Eval & Mgmt  Result Date: 06/16/2021 EXAM: NEW PATIENT OFFICE VISIT CHIEF COMPLAINT: Recent discovery of brain aneurysm. Current Pain Level: 1-10 HISTORY OF PRESENT ILLNESS: Patient is a 60 year old right handed lady with past history of coronary artery disease status post CABG, mild hypertension, hyperlipidemia and seizures referred by Dr. Delice Lesch, her neurologist for evaluation and management of a recently discovered anterior  communicating artery region aneurysm. The patient is accompanied by her son, Teresa [name]. History was obtained from the patient, her son, and her electronic medical chart. The patient reports diagnosis of seizures in 2017. She was reportedly noted to be confused and subsequently developed grand mal seizures. She had extensive workup which entailed a CT of the brain, CSF studies and continues EEG monitoring which showed seizure activity in the left temporal region. She was then discharged on Levetiractim XR in 2018. She then started having transient episodes of forgetfulness despite taking her anti seizure medications. During her recent workup which included MRI of the brain and MRA of the brain, a 7 mm x 4 mm anterior communicating artery aneurysm was detected, subsequently leading to this consultation. On further questioning, the patient experiences early morning headaches often waking her up from sleep on a daily basis for the past few months. These may become generalized and pounding to 7/10. The headaches are associated with no photophobia, or visual symptoms. These usually subside with Tylenol. She reports no involuntary motor activity at this time. She denies any associated nausea or vomiting with these headaches, and feels that these are different from her regular headaches. The patient also reports left arm and hand numbness with tingling and occasional movement. She reports this since the time of her CABG surgery, and usually do not progress to a generalized tonic-clonic activity. PAST MEDICAL HISTORY: Past Medical History: Diagnosis * : Date . * : Acute on chronic respiratory failure with hypoxia (HCC) * : . * : Acute renal failure due to tubular necrosis (HCC) * : . * : Atrial flutter (HCC) * : . * : Cardiogenic shock (HCC) * : . * : Coronary artery disease * : 08/13/2020 . * : Coronary artery disease involving native coronary artery of native heart * : . * : Coronary artery disease involving native coronary  artery of native heart without angina pectoris * : 09/17/2020 . * : Depressed left ventricular ejection fraction * : 08/13/2020 . * : Depression, unspecified * : 10/09/2015 . * : Essential hypertension * : 10/06/2015 . * : Gastro-esophageal reflux disease without esophagitis * : 10/06/2015 . * : HFrEF (heart failure with reduced ejection fraction) ([place]) * : 08/13/2020 . * : History of tonic-clonic seizures * : 04/25/2020 . * : Hyperlipidemia * : 10/06/2015 . * :  Ischemic cardiomyopathy * : 08/13/2020 . * : Mixed hyperlipidemia * : 09/17/2020 . * : Pericardial effusion * : 08/13/2020 . * : Post operative atrial fibrillation ([address]) * : 08/13/2020 . * : Seizure disorder ([place]) * : 03/03/2016 . * : Shortness of breath * : 04/25/2020 . * : Syncope * : 04/25/2020 . * : Tobacco use * : 08/13/2020 PAST SURGICAL HISTORY: Past Surgical History: Procedure * : Laterality * : Date . * : AORTIC VALVE REPLACEMENT (AVR)/CORONARY ARTERY BYPASS GRAFTING (CABG) * : * : 04/26/2020 * : X4 . * : APPENDECTOMY * : * : . * : TONSILLECTOMY * : * : . * : TOTAL ABDOMINAL HYSTERECTOMY * : * : . * : TOTAL HIP ARTHROPLASTY * : * : MEDICATIONS: Current Outpatient Medications on File Prior to Visit Medication * : Sig * : Dispense * : Refill . * : albuterol (VENTOLIN HFA) 108 (90 Base) MCG/ACT inhaler * : Inhale 1 puff into the lungs every 6 (six) hours as needed for wheezing or shortness of breath. * : * : . * : aspirin 81 MG chewable tablet * : Chew 1 tablet by mouth daily. * : * : . * : atorvastatin (LIPITOR) 20 MG tablet * : Take 20 mg by mouth at bedtime. * : * : . * : cholecalciferol (VITAMIN D3) 25 MCG (1000 UNIT) tablet * : Take 1,000 Units by mouth daily. * : * : . * : citalopram (CELEXA) 20 MG tablet * : Take 20 mg by mouth daily. * : * : . * : ezetimibe (ZETIA) 10 MG tablet * : Take 1 tablet (10 mg total) by mouth daily. * : 90 tablet * : 3 . * : ferrous sulfate 324 MG TBEC * : Take 324 mg by mouth daily with breakfast. * : * : . * : furosemide (LASIX) 40 MG tablet  * : Take 0.5 tablets (20 mg total) by mouth daily. * : 90 tablet * : 1 . * : gabapentin (NEURONTIN) 300 MG capsule * : Take 300 mg by mouth at bedtime. * : * : . * : levETIRAcetam (KEPPRA XR) 500 MG 24 hr tablet * : Take 1 tablet by mouth daily. * : * : . * : meclizine (ANTIVERT) 25 MG tablet * : Take 25 mg by mouth every 6 (six) hours as needed for dizziness. * : * : . * : meloxicam (MOBIC) 7.5 MG tablet * : Take 7.5 mg by mouth 2 (two) times daily as needed for pain. * : * : . * : metoprolol succinate (TOPROL-XL) 25 MG 24 hr tablet * : Take 0.5 tablets (12.5 mg total) by mouth daily. * : 90 tablet * : 1 . * : Omega-3 Fatty Acids (FISH OIL) 1000 MG CAPS * : Take 1,000 mg by mouth daily. * : * : . * : pantoprazole (PROTONIX) 40 MG tablet * : Take 40 mg by mouth daily. * : * : . * : traMADol (ULTRAM) 50 MG tablet * : Take 50 mg by mouth 3 (three) times daily as needed for pain. * : * : . * : zolpidem (AMBIEN) 10 MG tablet * : Take 5 mg by mouth at bedtime as needed for sleep. * : * : ALLERGIES:No Known AllergiesFAMILY HISTORY: Family History Problem * : Relation * : Age of Onset . * : Hypertension * : Mother * : . * : Hypertension * : Father * : . * :  Hypertension * : Sister * : . * : Diabetes Mellitus I * : Sister * : . * : Hypertension * : Brother * : . * : Diabetes * : Paternal Grandmother * : SOCIAL HISTORY: Socioeconomic History . * : Marital status: * : Married * : * : Spouse name: * : Not on file . * : Number of children: * : Not on file . * : Years of education: * : Not on file . * : Highest education level: * : Not on file Occupational History . * : Not on file Tobacco Use . * : Smoking status: * : Former * : * : Packs/day: * : 1.50 * : * : Types: * : Cigarettes * : * : Quit date: * : 04/07/2020 * : * : Years since quitting: * : 1.0 . * : Smokeless tobacco: * : Never Vaping Use . * : Vaping Use: * : Never used Substance and Sexual Activity . * : Alcohol use: * : Never . * : Drug use: * : Never . * : Sexual  activity: * : Not on file Other Topics * : Concern . * : Not on file Social History Narrative * : Right handed * : Lives with sister Food Insecurity: Not on file Transportation Needs: Not on file Physical Activity: Not on file Stress: Not on file Social Connections: Not on file Intimate Partner Violence: Not on file Patient reports past history of smoking up to two and a half packs a day for many years. She denies any family history of brain aneurysms. She denies recent chills, fever or rigors. Her appetite is normal. Weight is steady. She denies any abdominal pain, constipation, diarrhea or melena. She does report food sticking intermittently in the mid esophageal region which she is able to subsequently wash out with drinking water. She has noted this since her CABG surgery. She denies any persistent coughing, wheezing or hemoptysis. Reports exertional dyspnea which is variable in terms of activity. This too is something since her surgery. She sleeps with 1 pillow. She denies any paroxysmal nocturnal dyspnea, or persistent ankle edema. She denies any dysuria, hematuria, polyuria. REVIEW OF SYSTEMS: Negative unless as mentioned above. PHYSICAL EXAMINATION: Appears in no acute distress.  Affect appropriate, slightly anxious. Normal eye contact. Speech and comprehension normal. Patient admits to memory lapses during her history taking in the past medical history. Otherwise, no grossly abnormal lateralizing neurologic features. ASSESSMENT AND PLAN: Patient's MRA findings were reviewed with her and her son. Brought to their attention was the presence of a 7 mm x 4 mm medium neck anterior communicating artery aneurysm appearing slightly bilobed. No other intracranial aneurysms identified on the MRA examination. It was felt that it is possible that the patient's new onset of her recently noted generalized headaches which wake her up in the morning could be related to this intracranial aneurysm. The natural history of  unruptured intracranial aneurysms was reviewed with the patient. Increased risk of growth and/or rupture with attendant significant morbidity and mortality was reviewed in detail. Increased risk of rupture with hypertension, smoking, female gender, and family history of intracranial aneurysms was discussed. Usual risk of rupture is of approximately 1-2% per year. Management considerations were those of consideration of treatment eliminating the aneurysm from the circulation thereby in eliminating the risk of growth and/or rupture. Also were those of endovascular treatment which may be primary coiling, stent assisted coiling, placement of intra saccular flow disrupter device. Risk of  these would be approximately 1% chance of a thromboembolic ischemic event, with a remote possibility of intraprocedural rupture, with death. Briefly open surgical clipping was also discussed. In order to clarify the exact morphology of the size and the anatomy of the aneurysm, a formal catheter arteriogram would be recommended. The procedure was discussed briefly. The route would be via the radiograph versus a femoral route. Extreme low risk of the procedure was reviewed. Depending on the information of the arteriogram, endovascular treatment options would be made. The other option was to continue with conservative surveillance with regular neuro imaging studies. Patient expressed her desire to proceed with a diagnostic catheter arteriogram. This will be scheduled at the earliest possible. Both the son and the patient were asked to call should they have any concerns or questions. Electronically Signed   By: Teresa [name] M.D.   On: 06/16/2021 08:10    Labs:  CBC: Recent Labs    07/15/20 0421 08/13/20 0900 12/08/20 1530 07/05/21 0806  WBC 10.2 8.6 7.0 7.9  HGB 8.4* 11.0* 11.4 12.3  HCT 26.6* 34.6 34.1 37.3  PLT 412* 387 280 303    COAGS: Recent Labs    07/05/21 0806  INR 0.9    BMP: Recent Labs     07/11/20 0400 07/12/20 0652 07/15/20 0421 08/13/20 0900 09/17/20 1446 12/08/20 1530  NA 139  --  138 141 141 141  K 3.4*   < > 3.7 3.9 4.2 4.1  CL 102  --  102 100 101 102  CO2 29  --  26 24 25 26   GLUCOSE 90  --  94 105* 88 83  BUN 13  --  11 12 12 9   CALCIUM 8.6*  --  9.1 10.0 9.6 9.5  CREATININE 0.65  --  0.86 1.21* 0.99 0.97  GFRNONAA >60  --  >60 49* 62  --   GFRAA  --   --   --  56* 72  --    < > = values in this interval not displayed.    LIVER FUNCTION TESTS: No results for input(s): BILITOT, AST, ALT, ALKPHOS, PROT, ALBUMIN in the last 8760 hours.   Assessment and Plan:  60 y.o. female outpatient. History of CAD s/p CABG, HTN, HLD, and seizure disorder. Found tp have an anterior communicating artery region aneurysm. MR angio head from 10.19.22 reads confirmed bolobed aneurysm arising from the region of the anterior communicating artery and proximal A 2 right anterior cerebral artery, measuring 7X 4 mm. Patient was seen in [facility] clinic with Dr. 10.21.22 on 11.10.22. The patient was given several different treatment options and the patient decide to peruse. cerebral angiogram. Patient presents for cerebral angiogram.   Patient is on 325 mg of ASA. All labs and mediations are within acceptable parameters. NK DA. Patient has been NPO since midnight.   Risks and benefits of cerebral angiogram were discussed with the patient including, but not limited to bleeding, infection, vascular injury or contrast induced renal failure.  This interventional procedure involves the use of X-rays and because of the nature of the planned procedure, it is possible that we will have prolonged use of X-ray fluoroscopy.  Potential radiation risks to you include (but are not limited to) the following: - A slightly elevated risk for cancer  several years later in life. This risk is typically less than 0.5% percent. This risk is low in comparison to the normal incidence of human cancer, which is 33%  for women and 50%  for men according to the [place]. - Radiation induced injury can include skin redness, resembling a rash, tissue breakdown / ulcers and hair loss (which can be temporary or permanent).   The likelihood of either of these occurring depends on the difficulty of the procedure and whether you are sensitive to radiation due to previous procedures, disease, or genetic conditions.   IF your procedure requires a prolonged use of radiation, you will be notified and given written instructions for further action.  It is your responsibility to monitor the irradiated area for the 2 weeks following the procedure and to notify your physician if you are concerned that you have suffered a radiation induced injury.    All of the patient's questions were answered, patient is agreeable to proceed.  Consent signed and in chart.    Thank you for this interesting consult.  I greatly enjoyed meeting Teresa [name] and look forward to participating in their care.  A copy of this report was sent to the requesting provider on this date.  Electronically Signed: Jacqualine Mau, NP 07/05/2021, 9:08 AM   I spent a total of  40 Minutes   in face to face in clinical consultation, greater than 50% of which was counseling/coordinating care for cerebral angiogram

## 2021-07-05 NOTE — Procedures (Signed)
S./P 4 vessel cerebral arteriogram RT CFA approach. Findings. 1.Approx 6.7 mm x 4.mm ACOM aneurysm. S.Armani Brar MD

## 2021-07-05 NOTE — Sedation Documentation (Signed)
Right femoral sheath removed, 47fr exoseal closure device deployed to right femoral site.

## 2021-08-23 ENCOUNTER — Ambulatory Visit: Payer: Self-pay | Admitting: Neurology

## 2021-08-23 ENCOUNTER — Telehealth: Payer: Self-pay | Admitting: Student

## 2021-08-23 ENCOUNTER — Other Ambulatory Visit (HOSPITAL_COMMUNITY): Payer: Self-pay | Admitting: Interventional Radiology

## 2021-08-23 ENCOUNTER — Telehealth (HOSPITAL_COMMUNITY): Payer: Self-pay | Admitting: Radiology

## 2021-08-23 ENCOUNTER — Telehealth: Payer: Self-pay | Admitting: Neurology

## 2021-08-23 DIAGNOSIS — I671 Cerebral aneurysm, nonruptured: Secondary | ICD-10-CM

## 2021-08-23 MED ORDER — TICAGRELOR 90 MG PO TABS: 90.0000 mg | ORAL_TABLET | Freq: Two times a day (BID) | ORAL | 3 refills | Status: DC

## 2021-08-23 NOTE — Telephone Encounter (Signed)
Per request from Dr. Corliss Skains, Brilinta e-prescribed to the [facility] in [place]. 90 mg tablets twice daily x 60 with 3 refills. A voice mail message was left on the patient's cell phone along with instructions to decrease daily aspirin dose to 81 mg. Patient instructed to call IR (APP office number left) with any questions.  Alwyn Ren, [place] [phone] 08/23/2021, 3:53 PM

## 2021-08-23 NOTE — Telephone Encounter (Signed)
Pt advised to call Dr Reginia Forts office and asked to speak to the surgery scheduler directly and not be sent to voice mail to see if they can get her scheduled

## 2021-08-23 NOTE — Telephone Encounter (Signed)
Patients son said they have not heard back from the doctor for her surgery. They found an aneurysm and they were supposed to do surgery. They have called and left messages , but no one has called him back. This was back in December.

## 2021-08-23 NOTE — Telephone Encounter (Signed)
Called pt to schedule brain aneurysm tx with Dr. Corliss Skains. Left VM for her to call me back to schedule. JM

## 2021-08-25 ENCOUNTER — Telehealth (HOSPITAL_COMMUNITY): Payer: Self-pay | Admitting: Radiology

## 2021-08-25 ENCOUNTER — Other Ambulatory Visit: Payer: Self-pay | Admitting: Student

## 2021-08-25 MED ORDER — TICAGRELOR 90 MG PO TABS: 90.0000 mg | ORAL_TABLET | Freq: Two times a day (BID) | ORAL | 3 refills | Status: DC

## 2021-08-25 NOTE — Telephone Encounter (Signed)
Patient's son called and states that per insurance, Marden Noble will need to authorized. Aimee, PA will call [facility] to give the patient's need for this medication. JM

## 2021-08-26 ENCOUNTER — Telehealth (HOSPITAL_COMMUNITY): Payer: Self-pay | Admitting: Radiology

## 2021-08-26 ENCOUNTER — Other Ambulatory Visit (HOSPITAL_COMMUNITY): Payer: Self-pay

## 2021-08-26 ENCOUNTER — Other Ambulatory Visit: Payer: Self-pay | Admitting: Student

## 2021-08-26 DIAGNOSIS — I671 Cerebral aneurysm, nonruptured: Secondary | ICD-10-CM

## 2021-08-26 MED ORDER — TICAGRELOR 90 MG PO TABS
90.0000 mg | ORAL_TABLET | Freq: Two times a day (BID) | ORAL | 3 refills | Status: DC
  Filled 2021-08-26: qty 60, 30d supply, fill #0

## 2021-08-26 MED ORDER — ASPIRIN 325 MG PO CAPS: 81.0000 | ORAL_CAPSULE | Freq: Every day | ORAL | Status: DC

## 2021-08-26 NOTE — Telephone Encounter (Signed)
Spoke to [place] (pt's son), he will have patient's Brilinta 90mg  1 BID picked up today at [facility] by a friend before 5 pm. He is aware that, per Dr. [facility], patient will take 1 Brilinta 90mg  tablet tonight. Beginning tomorrow she will change her Aspirin to 81mg  daily and take Brilinta 90mg  BID. Pt and son understand and agrees with this plan of care. JM

## 2021-08-29 ENCOUNTER — Other Ambulatory Visit: Payer: Self-pay | Admitting: Radiology

## 2021-08-29 ENCOUNTER — Other Ambulatory Visit (HOSPITAL_COMMUNITY): Payer: Self-pay | Admitting: Interventional Radiology

## 2021-08-30 ENCOUNTER — Other Ambulatory Visit: Payer: Self-pay | Admitting: Radiology

## 2021-08-30 ENCOUNTER — Other Ambulatory Visit: Payer: Self-pay

## 2021-08-30 ENCOUNTER — Encounter (HOSPITAL_COMMUNITY): Payer: Self-pay | Admitting: Interventional Radiology

## 2021-08-30 LAB — SARS CORONAVIRUS 2 (TAT 6-24 HRS): SARS Coronavirus 2: NEGATIVE

## 2021-08-30 NOTE — Progress Notes (Signed)
Anesthesia Chart Review: Same-day work-up  Follows with cardiology for hx of CAD s/p CABG x4 (SVG to PDA, SVG to first obtuse marginal, SVG to diagonal and LIMA to LAD) on April 25, 2020, ischemic cardiomyopathy EF in September 2021 with severely depressed EF 25 to 30%, however in October 2021 her EF improved slightly to 35 to 40%, hypertension, hyperlipidemia, smoker, prolonged hospitalization from September overall 90 days, respiratory failure was on mechanical ventilation then status post tracheostomy (decannulation 06/22/2020). Last seen by cardiologist Dr. Servando Salina 03/16/21. Per note, "She was doing well from a cardiovascular standpoint.  No anginal symptoms.  We will continue her current medication regimen. Discussed her monitor results we did not show any arrhythmia.  She has not had any episodes of syncope. She has a history of seizure and has had some episodes of amnesia which is very concerning.  She has not seen neurology in quite some time now.  As her neurologist moved out of the area.  I am referring the patient to neurology at this time."  51-month cardiology follow-up was recommended.  Patient seen by neurologist Dr. Karel Jarvis 04/20/2021.  Per note, "No convulsions since 2017. She had loss of consciousness in 04/2020 in the setting of NSTEMI and had a prolonged hospital stay. Since then, she has had memory changes, left-sided sensory changes. She also had an episode of loss of time at the beginning of August, concerning for a focal impaired awareness seizure. MRI brain with and without contrast and 1-hour EEG will be ordered. She was instructed to increase Levetiracetam ER 500mg : Take 3 tablets daily, side effects discussed. [place] driving laws were discussed with the patient, and she knows to stop driving after an episode of loss of awareness until 6 months event-free. Follow-up in 3-4 months, they know to call for any changes." EEG done 04/27/21, Dr. 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 07/11/21 referred the pt to Dr. Karel Jarvis.   Patient will need day of surgery labs and evaluation  ZIO monitor Patch Wear Time:  9 days and 9 hours starting Dec 27, 2020. Indication: Dizziness   Patient had a minimum HR of 46 bpm, maximum HR of 98 bpm, and average HR of 60 bpm. Predominant underlying rhythm was Sinus Rhythm. Bundle Branch Block/IVCD was present.   Premature atrial complexes were rare. Premature ventricular complexes were rare.   Symptoms are associated with sinus rhythm.   No ventricular tachycardia, no pauses, no supraventricular tachycardia noted.   Conclusion: Normal/unremarkable study with no significant arrhythmia.   TTE 09/10/2018:  1. Left ventricular ejection fraction, by estimation, is 40 to 45%. The left ventricle has mildly decreased function. The left ventricle has no regional wall motion abnormalities. The left ventricular internal cavity size was mildly dilated. Left  ventricular diastolic parameters were normal.   2. Right ventricular systolic function is normal. The right ventricular size is normal. There is normal pulmonary artery systolic pressure.   3. The mitral valve is normal in structure. No evidence of mitral valve regurgitation. No evidence of mitral stenosis.   4. The aortic valve is normal in structure. Aortic valve regurgitation is mild. No aortic stenosis is present.   5. The inferior vena cava is normal in size with  greater than 50% respiratory variability, suggesting right atrial pressure of 3 mmHg.     Zannie Cove [facility] Short Stay Center/Anesthesiology Phone [phone] 08/30/2021 1:31 PM

## 2021-08-30 NOTE — Anesthesia Preprocedure Evaluation (Addendum)
Anesthesia Evaluation  Patient identified by MRN, date of birth, ID band Patient awake    Reviewed: Allergy & Precautions, H&P , NPO status , Patient's Chart, lab work & pertinent test results, reviewed documented beta blocker date and time   Airway Mallampati: II  TM Distance: >3 FB Neck ROM: full    Dental  (+) Poor Dentition, Upper Dentures, Dental Advisory Given   Pulmonary shortness of breath, former smoker,  H/o trach. S/p decannulation.   breath sounds clear to auscultation       Cardiovascular hypertension, Pt. on medications + CAD, + CABG and +CHF  + dysrhythmias Atrial Fibrillation  Rhythm:regular Rate:Normal     Neuro/Psych Seizures -,  PSYCHIATRIC DISORDERS Depression    GI/Hepatic GERD  ,  Endo/Other    Renal/GU      Musculoskeletal   Abdominal   Peds  Hematology   Anesthesia Other Findings   Reproductive/Obstetrics                           Anesthesia Physical Anesthesia Plan  ASA: 3  Anesthesia Plan: General   Post-op Pain Management:    Induction: Intravenous  PONV Risk Score and Plan: 3 and Ondansetron, Dexamethasone, Midazolam and Treatment may vary due to age or medical condition  Airway Management Planned: Oral ETT  Additional Equipment: Arterial line  Intra-op Plan:   Post-operative Plan: Extubation in OR  Informed Consent: I have reviewed the patients History and Physical, chart, labs and discussed the procedure including the risks, benefits and alternatives for the proposed anesthesia with the patient or authorized representative who has indicated his/her understanding and acceptance.     Dental advisory given  Plan Discussed with: CRNA, Anesthesiologist and Surgeon  Anesthesia Plan Comments: (PAT note by Antionette Poles, PA-C:  Follows with cardiology for hx of CAD s/p CABG x4(SVG to PDA, SVG to first obtuse marginal, SVG to diagonal and LIMA to LAD)  on April 25, 2020, ischemic cardiomyopathy EF in September 2021 with severely depressed EF 25 to 30%, however in October 2021 her EF improved slightly to 35 to 40%, hypertension, hyperlipidemia, smoker, prolonged hospitalization from September overall 90 days,respiratory failure was on mechanical ventilation then status post tracheostomy (decannulation 06/22/2020). Last seen by cardiologist Dr. Servando Salina 03/16/21. Per note, "She was doing well from a cardiovascular standpoint.  No anginal symptoms.  We will continue her current medication regimen. Discussed her monitor results we did not show any arrhythmia.  She has not had any episodes of syncope. She has a history of seizure and has had some episodes of amnesia which is very concerning.  She has not seen neurology in quite some time now.  As her neurologist moved out of the area.  I am referring the patient to neurology at this time."  24-month cardiology follow-up was recommended.  Patient seen by neurologist Dr. Karel Jarvis 04/20/2021.  Per note, "No convulsions since 2017. She had loss of consciousness in 04/2020 in the setting of NSTEMI and had a prolonged hospital stay. Since then, she has had memory changes, left-sided sensory changes. She also had an episode of loss of time at the beginning of August, concerning for a focal impaired awareness seizure. MRI brain with and without contrast and 1-hour EEG will be ordered. She was instructed to increase Levetiracetam ER 500mg : Take 3 tablets daily, side effects discussed. driving laws were discussed with the patient, andsheknows to stop driving after an episode of loss of awareness until  6 months event-free. Follow-up in 3-4 months, they know to call for any changes." EEG done 04/27/21, Dr. 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 Karel Jarvis referred the pt to Dr. Corliss Skains.   Patient will need day of surgery labs and evaluation  ZIO monitor Patch Wear Time: 9 days and 9 hours starting Dec 27, 2020. Indication: Dizziness  Patient had a minimum HR of 46 bpm, maximum HR of 98 bpm, and average HR of 60 bpm. Predominant underlying rhythm was Sinus Rhythm. Bundle Branch Block/IVCD was present.  Premature atrial complexes were rare. Premature ventricular complexes were rare.  Symptoms are associated with sinus rhythm.  No ventricular tachycardia, no pauses, no supraventricular tachycardia noted.  Conclusion: Normal/unremarkable study with no significant arrhythmia.  TTE 09/10/2018: 1. Left ventricular ejection fraction, by estimation, is 40 to 45%. The left ventricle has mildly decreased function. The left ventricle has no regional wall motion abnormalities. The left ventricular internal cavity size was mildly dilated. Left  ventricular diastolic parameters were normal.  2. Right ventricular systolic function is normal. The right ventricular size is normal. There is normal pulmonary artery systolic pressure.  3. The mitral valve is normal in structure. No evidence of mitral valve regurgitation. No evidence of mitral stenosis.  4. The aortic valve is normal in structure. Aortic valve regurgitation is mild. No aortic stenosis is present.  5. The inferior vena cava is normal in size with greater than 50% respiratory variability, suggesting right atrial pressure of 3 mmHg.   )       Anesthesia Quick Evaluation

## 2021-08-30 NOTE — Progress Notes (Signed)
PCP - Dr. Nedra Hai Cardiologist - Dr. Servando Salina EKG - 09/17/20 Chest x-ray -  ECHO - 09/10/20 Cardiac Cath -  CPAP -   Blood Thinner Instructions: see note from Karle Barr (scheduler) -- Follow your surgeon's instructions on how to take ASA and Brilinta.  If no instructions were given by your surgeon then you will need to call the office to get those instructions.    Aspirin Instructions: see above ERAS Protcol -  COVID TEST- per pt, she was tested 08/30/21  Anesthesia review: yes  -------------  SDW INSTRUCTIONS:  Your procedure is scheduled on 08/31/21. Please report to [facility] Main Entrance "A" at 06:00 A.M., and check in at the Admitting office. Call this number if you have problems the morning of surgery: [phone]  Remember: Do not eat or drink after midnight the night before your surgery   Medications to take morning of surgery with a sip of water include: acetaminophen (TYLENOL) if needed traMADol (ULTRAM) if needed albuterol (VENTOLIN HFA) if needed ---- Please bring all inhalers with you the day of surgery.  ASA Brilinta atorvastatin (LIPITOR)  citalopram (CELEXA)  ezetimibe (ZETIA)  levETIRAcetam (KEPPRA XR) metoprolol succinate (TOPROL-XL)  pantoprazole (PROTONIX)   As of today, STOP taking any meloxicam (MOBIC), Aleve, Naproxen, Ibuprofen, Motrin, Advil, Goody's, BC's, all herbal medications, fish oil, and all vitamins.    The Morning of Surgery Do not wear jewelry, make-up or nail polish. Do not wear lotions, powders, or perfumes, or deodorant Do not bring valuables to the hospital. [facility] is not responsible for any belongings or valuables.  If you are a smoker, DO NOT Smoke 24 hours prior to surgery  If you wear a CPAP at night please bring your mask the morning of surgery   Remember that you must have someone to transport you home after your surgery, and remain with you for 24 hours if you are discharged the same day.  Please bring cases for  contacts, glasses, hearing aids, dentures or bridgework because it cannot be worn into surgery.   Patients discharged the day of surgery will not be allowed to drive home.   Please shower the NIGHT BEFORE/MORNING OF SURGERY (use antibacterial soap like DIAL soap if possible). Wear comfortable clothes the morning of surgery. Oral Hygiene is also important to reduce your risk of infection.  Remember - BRUSH YOUR TEETH THE MORNING OF SURGERY WITH YOUR REGULAR TOOTHPASTE  Patient denies shortness of breath, fever, cough and chest pain.

## 2021-08-30 NOTE — H&P (Signed)
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 female with a medical history significant for CAD s/p CABG, HTN and seizure disorder. She was referred to Dr. 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 She was referred to Neuro Interventional Radiology and underwent a diagnostic cerebral angiogram 07/05/21 which confirmed the presence of a 6.7 mm x 4 mm aneurysm.   The findings were discussed with the patient and her son. Management options were discussed including endovascular treatment and the patient and her son were in agreement to proceed. They are aware this procedure will be done with general anesthesia and planned overnight observation in the Neuro ICU. The patient was also started on Brilinta 90 mg BID and instructed to decrease daily aspirin to 81 mg (previous dose was 325 mg).   Past Medical History:  Diagnosis Date   Acute on chronic respiratory failure with hypoxia (HCC)    Acute renal failure due to tubular necrosis [facility])    Atrial flutter (HCC)    Cardiogenic shock (HCC)    Coronary artery disease 08/13/2020   Coronary artery disease involving native coronary artery of native heart    Coronary artery disease involving native coronary artery of native heart without angina pectoris 09/17/2020   Depressed left ventricular ejection fraction 08/13/2020   Depression, unspecified 10/09/2015   Essential hypertension 10/06/2015   Gastro-esophageal reflux disease without esophagitis 10/06/2015   HFrEF (heart failure with reduced ejection fraction) (HCC) 08/13/2020   History of tonic-clonic seizures 04/25/2020   Hyperlipidemia 10/06/2015   Ischemic cardiomyopathy 08/13/2020   Mixed hyperlipidemia 09/17/2020    Pericardial effusion 08/13/2020   Post operative atrial fibrillation (HCC) 08/13/2020   Seizure disorder (HCC) 03/03/2016   Shortness of breath 04/25/2020   Syncope 04/25/2020   Tobacco use 08/13/2020    Past Surgical History:  Procedure Laterality Date   AORTIC VALVE REPLACEMENT (AVR)/CORONARY ARTERY BYPASS GRAFTING (CABG)  04/26/2020   X4   APPENDECTOMY     IR 3D INDEPENDENT WKST  07/05/2021   IR ANGIO INTRA EXTRACRAN SEL COM CAROTID INNOMINATE BILAT MOD SED  07/05/2021   IR ANGIO VERTEBRAL SEL SUBCLAVIAN INNOMINATE BILAT MOD SED  07/05/2021   IR RADIOLOGIST EVAL & MGMT  06/16/2021   TONSILLECTOMY     TOTAL ABDOMINAL HYSTERECTOMY     TOTAL HIP ARTHROPLASTY      Allergies: Patient has no allergy information on record.  Medications: Prior to Admission medications   Medication Sig Start Date End Date Taking? Authorizing Provider  acetaminophen (TYLENOL) 500 MG tablet Take 1,000 mg by mouth every 6 (six) hours as needed for moderate pain or mild pain.   Yes [provider]  albuterol (VENTOLIN HFA) 108 (90 Base) MCG/ACT inhaler Inhale 1 puff into the lungs every 6 (six) hours as needed for wheezing or shortness of breath. 09/20/15  Yes [provider]  Aspirin 325 MG CAPS Take 81 tablets by mouth daily. 08/26/21  Yes Hoyt Koch, PA  atorvastatin (LIPITOR) 40 MG tablet Take 40 mg by mouth daily. 03/20/21  Yes [provider]  Cholecalciferol (VITAMIN D) 50 MCG (2000 UT) tablet Take 2,000 Units by mouth daily.   Yes [provider]  citalopram (CELEXA) 20 MG tablet Take 20 mg by mouth daily. 04/01/20  Yes [provider]  ezetimibe (ZETIA) 10 MG tablet Take 1 tablet (10 mg total) by mouth daily. 09/21/20  Yes Tobb, Kardie, DO  ferrous sulfate 324 MG TBEC Take 324 mg by mouth daily with breakfast.   Yes [provider]  furosemide (LASIX) 40 MG tablet Take 0.5 tablets (20 mg total) by mouth daily. Patient taking  differently: Take 20 mg by mouth every other day. 02/10/21  Yes Tobb, Kardie, DO  levETIRAcetam (KEPPRA XR) 500 MG 24 hr tablet Take 3 tablets daily Patient taking differently: 500-1,000 mg See admin instructions. Take 1000 mg in the morning and 500 mg at bedtime 04/20/21  Yes Van ClinesAquino, Karen M, MD  meloxicam (MOBIC) 7.5 MG tablet Take 7.5 mg by mouth 2 (two) times daily as needed for pain. 02/28/21  Yes [provider]  metoprolol succinate (TOPROL-XL) 25 MG 24 hr tablet Take 0.5 tablets (12.5 mg total) by mouth daily. 02/10/21  Yes Tobb, Kardie, DO  Omega-3 Fatty Acids (FISH OIL) 1000 MG CAPS Take 1,000 mg by mouth daily.   Yes [provider]  pantoprazole (PROTONIX) 40 MG tablet Take 40 mg by mouth daily. 04/15/20  Yes [provider]  PREBIOTIC PRODUCT PO Take 1 tablet by mouth daily.   Yes [provider]  traMADol (ULTRAM) 50 MG tablet Take 50 mg by mouth 3 (three) times daily as needed for pain. 11/26/20  Yes [provider]  zolpidem (AMBIEN) 10 MG tablet Take 10 mg by mouth at bedtime. 10/26/20  Yes [provider]  ticagrelor (BRILINTA) 90 MG TABS tablet Take 1 tablet (90 mg total) by mouth 2 (two) times daily. 08/26/21 12/24/21  Hoyt KochMatthews, Kacie Sue-Ellen, PA     Family History  Problem Relation Age of Onset   Hypertension Mother    Hypertension Father    Hypertension Sister    Diabetes Mellitus I Sister    Hypertension Brother    Diabetes Paternal Grandmother     Social History   Socioeconomic History   Marital status: Married    Spouse name: Not on file   Number of children: Not on file   Years of education: Not on file   Highest education level: Not on file  Occupational History   Not on file  Tobacco Use   Smoking status: Former    Packs/day: 1.50    Types: Cigarettes    Quit date: 04/07/2020    Years since quitting: 1.4   Smokeless tobacco: Never  Vaping Use   Vaping Use: Never used  Substance and Sexual Activity    Alcohol use: Never   Drug use: Never   Sexual activity: Not on file  Other Topics Concern   Not on file  Social History Narrative   Right handed    Lives with sister    Social Determinants of Health   Financial Resource Strain: Not on file  Food Insecurity: Not on file  Transportation Needs: Not on file  Physical Activity: Not on file  Stress: Not on file  Social Connections: Not on file    Review of Systems: A 12 point ROS discussed and pertinent positives are indicated in the HPI above.  All other systems are negative.  Review of Systems  Constitutional:  Positive for fatigue. Negative for appetite change.  Respiratory:  Positive for shortness of breath. Negative for cough.   Cardiovascular:  Positive for leg swelling. Negative for chest pain.  Gastrointestinal:  Negative for nausea and vomiting.  Neurological:  Positive for headaches.  Negative for dizziness.   Vital Signs: BP 127/76 (BP Location: Left Arm)    Pulse 71    Temp 98.1 F (36.7 C) (Oral)    Resp 16    Ht 4\' 11"  (1.499 m)    Wt 175 lb (79.4 kg)    SpO2 97%    BMI 35.35 kg/m   Physical Exam Constitutional:      General: She is not in acute distress. HENT:     Mouth/Throat:     Mouth: Mucous membranes are moist.     Pharynx: Oropharynx is clear.  Cardiovascular:     Rate and Rhythm: Normal rate and regular rhythm.     Heart sounds: Normal heart sounds.  Pulmonary:     Effort: Pulmonary effort is normal.     Breath sounds: Normal breath sounds.  Abdominal:     General: Bowel sounds are normal.     Palpations: Abdomen is soft.  Musculoskeletal:     Right lower leg: Edema present.     Left lower leg: Edema present.  Skin:    General: Skin is warm and dry.     Comments: Scattered scabs, bruises  Neurological:     Mental Status: She is alert and oriented to person, place, and time.    Imaging: No results found.  Labs:  CBC: Recent Labs    12/08/20 1530 07/05/21 0806 08/31/21 0616  WBC 7.0  7.9 8.9  HGB 11.4 12.3 13.2  HCT 34.1 37.3 40.0  PLT 280 303 425*    COAGS: Recent Labs    07/05/21 0806 08/31/21 0616  INR 0.9 0.9    BMP: Recent Labs    09/17/20 1446 12/08/20 1530 07/05/21 0806 08/31/21 0616  NA 141 141 138 134*  K 4.2 4.1 3.8 3.1*  CL 101 102 105 99  CO2 25 26 26 25   GLUCOSE 88 83 107* 141*  BUN 12 9 15 9   CALCIUM 9.6 9.5 8.8* 9.2  CREATININE 0.99 0.97 0.95 1.05*  GFRNONAA 62  --  >60 >60  GFRAA 72  --   --   --     LIVER FUNCTION TESTS: No results for input(s): BILITOT, AST, ALT, ALKPHOS, PROT, ALBUMIN in the last 8760 hours.  TUMOR MARKERS: No results for input(s): AFPTM, CEA, CA199, CHROMGRNA in the last 8760 hours.  Assessment and Plan:  Anterior Communicating Artery Aneurysm: Teresa [name], 61 year old female, presents today to the Neuro Interventional Radiology department for an image-guided cerebral angiogram with planned intervention/possible embolization of the anterior communicating artery aneurysm. This procedure will be done under general anesthesia  with planned overnight observation in the Neuro ICU.   Risks and benefits of this procedure were discussed with the patient including, but not limited to bleeding, infection, vascular injury or contrast induced renal failure.  This interventional procedure involves the use of X-rays and because of the nature of the planned procedure, it is possible that we will have prolonged use of X-ray fluoroscopy.  Potential radiation risks to you include (but are not limited to) the following: - A slightly elevated risk for cancer  several years later in life. This risk is typically less than 0.5% percent. This risk is low in comparison to the normal incidence of human cancer, which is 33% for women and 50% for men according to the [employer] Society. - Radiation induced injury can include skin redness, resembling a rash, tissue breakdown / ulcers and hair loss (which can be temporary or  permanent).  The likelihood of either of these occurring depends on the difficulty of the procedure and whether you are sensitive to radiation due to previous procedures, disease, or genetic conditions.   IF your procedure requires a prolonged use of radiation, you will be notified and given written instructions for further action.  It is your responsibility to monitor the irradiated area for the 2 weeks following the procedure and to notify your physician if you are concerned that you have suffered a radiation induced injury.    All of the patient's questions were answered, patient is agreeable to proceed. She has been NPO. She states she took her Brilinta this morning.   Consent signed and in chart.  Thank you for this interesting consult.  I greatly enjoyed meeting Teresa [name] and look forward to participating in their care.  A copy of this report was sent to the requesting provider on this date.  Electronically Signed: Alwyn Ren, AGACNP-BC [phone] 08/31/2021, 8:11 AM   I spent a total of  30 Minutes   in face to face in clinical consultation, greater than 50% of which was counseling/coordinating care for cerebral angiogram with possible embolization.

## 2021-08-31 ENCOUNTER — Observation Stay (HOSPITAL_COMMUNITY)
Admission: AD | Admit: 2021-08-31 | Discharge: 2021-09-01 | Disposition: A | Discharge status: Home / Self Care | Payer: 59 | Attending: Interventional Radiology | Admitting: Interventional Radiology

## 2021-08-31 ENCOUNTER — Encounter (HOSPITAL_COMMUNITY): Payer: Self-pay | Admitting: Interventional Radiology

## 2021-08-31 ENCOUNTER — Ambulatory Visit (HOSPITAL_COMMUNITY): Payer: 59 | Admitting: Physician Assistant

## 2021-08-31 ENCOUNTER — Encounter (HOSPITAL_COMMUNITY)
Admission: AD | Disposition: A | Discharge status: Home / Self Care | Payer: Self-pay | Source: Home / Self Care | Attending: Interventional Radiology

## 2021-08-31 ENCOUNTER — Ambulatory Visit (HOSPITAL_COMMUNITY)
Admission: RE | Admit: 2021-08-31 | Discharge: 2021-08-31 | Disposition: A | Discharge status: Home / Self Care | Payer: 59 | Source: Ambulatory Visit | Attending: Interventional Radiology | Admitting: Interventional Radiology

## 2021-08-31 DIAGNOSIS — Z96649 Presence of unspecified artificial hip joint: Secondary | ICD-10-CM | POA: Diagnosis present

## 2021-08-31 DIAGNOSIS — F32A Depression, unspecified: Secondary | ICD-10-CM | POA: Diagnosis not present

## 2021-08-31 DIAGNOSIS — I671 Cerebral aneurysm, nonruptured: Secondary | ICD-10-CM

## 2021-08-31 DIAGNOSIS — I5020 Unspecified systolic (congestive) heart failure: Secondary | ICD-10-CM | POA: Insufficient documentation

## 2021-08-31 DIAGNOSIS — Z8249 Family history of ischemic heart disease and other diseases of the circulatory system: Secondary | ICD-10-CM | POA: Diagnosis not present

## 2021-08-31 DIAGNOSIS — Z951 Presence of aortocoronary bypass graft: Secondary | ICD-10-CM | POA: Insufficient documentation

## 2021-08-31 DIAGNOSIS — I110 Hypertensive heart disease with heart failure: Secondary | ICD-10-CM | POA: Diagnosis not present

## 2021-08-31 DIAGNOSIS — Z79899 Other long term (current) drug therapy: Secondary | ICD-10-CM | POA: Diagnosis not present

## 2021-08-31 DIAGNOSIS — G40909 Epilepsy, unspecified, not intractable, without status epilepticus: Secondary | ICD-10-CM | POA: Diagnosis not present

## 2021-08-31 DIAGNOSIS — E782 Mixed hyperlipidemia: Secondary | ICD-10-CM | POA: Diagnosis not present

## 2021-08-31 DIAGNOSIS — I255 Ischemic cardiomyopathy: Secondary | ICD-10-CM | POA: Diagnosis not present

## 2021-08-31 DIAGNOSIS — Z87891 Personal history of nicotine dependence: Secondary | ICD-10-CM | POA: Diagnosis not present

## 2021-08-31 DIAGNOSIS — K219 Gastro-esophageal reflux disease without esophagitis: Secondary | ICD-10-CM | POA: Diagnosis present

## 2021-08-31 DIAGNOSIS — Z7982 Long term (current) use of aspirin: Secondary | ICD-10-CM | POA: Insufficient documentation

## 2021-08-31 DIAGNOSIS — R569 Unspecified convulsions: Secondary | ICD-10-CM | POA: Insufficient documentation

## 2021-08-31 DIAGNOSIS — I252 Old myocardial infarction: Secondary | ICD-10-CM

## 2021-08-31 DIAGNOSIS — Z7902 Long term (current) use of antithrombotics/antiplatelets: Secondary | ICD-10-CM | POA: Diagnosis not present

## 2021-08-31 DIAGNOSIS — I10 Essential (primary) hypertension: Secondary | ICD-10-CM | POA: Insufficient documentation

## 2021-08-31 DIAGNOSIS — I5022 Chronic systolic (congestive) heart failure: Secondary | ICD-10-CM | POA: Diagnosis present

## 2021-08-31 DIAGNOSIS — I4891 Unspecified atrial fibrillation: Secondary | ICD-10-CM | POA: Insufficient documentation

## 2021-08-31 DIAGNOSIS — I2510 Atherosclerotic heart disease of native coronary artery without angina pectoris: Secondary | ICD-10-CM | POA: Insufficient documentation

## 2021-08-31 DIAGNOSIS — I509 Heart failure, unspecified: Secondary | ICD-10-CM | POA: Diagnosis not present

## 2021-08-31 HISTORY — PX: IR 3D INDEPENDENT WKST: IMG2385

## 2021-08-31 HISTORY — PX: IR ANGIO INTRA EXTRACRAN SEL INTERNAL CAROTID UNI R MOD SED: IMG5362

## 2021-08-31 HISTORY — PX: IR TRANSCATH/EMBOLIZ: IMG695

## 2021-08-31 HISTORY — PX: IR ANGIOGRAM FOLLOW UP STUDY: IMG697

## 2021-08-31 HISTORY — DX: Cerebral aneurysm, nonruptured: I67.1

## 2021-08-31 HISTORY — PX: RADIOLOGY WITH ANESTHESIA: SHX6223

## 2021-08-31 LAB — CBC WITH DIFFERENTIAL/PLATELET
Abs Immature Granulocytes: 0.05 10*3/uL (ref 0.00–0.07)
Basophils Absolute: 0.1 10*3/uL (ref 0.0–0.1)
Basophils Relative: 1 %
Eosinophils Absolute: 0.2 10*3/uL (ref 0.0–0.5)
Eosinophils Relative: 2 %
HCT: 40 % (ref 36.0–46.0)
Hemoglobin: 13.2 g/dL (ref 12.0–15.0)
Immature Granulocytes: 1 %
Lymphocytes Relative: 24 %
Lymphs Abs: 2.2 10*3/uL (ref 0.7–4.0)
MCH: 32.5 pg (ref 26.0–34.0)
MCHC: 33 g/dL (ref 30.0–36.0)
MCV: 98.5 fL (ref 80.0–100.0)
Monocytes Absolute: 0.7 10*3/uL (ref 0.1–1.0)
Monocytes Relative: 8 %
Neutro Abs: 5.7 10*3/uL (ref 1.7–7.7)
Neutrophils Relative %: 64 %
Platelets: 425 10*3/uL — ABNORMAL HIGH (ref 150–400)
RBC: 4.06 MIL/uL (ref 3.87–5.11)
RDW: 12.3 % (ref 11.5–15.5)
WBC: 8.9 10*3/uL (ref 4.0–10.5)
nRBC: 0 % (ref 0.0–0.2)

## 2021-08-31 LAB — BASIC METABOLIC PANEL
Anion gap: 10 (ref 5–15)
BUN: 9 mg/dL (ref 8–23)
CO2: 25 mmol/L (ref 22–32)
Calcium: 9.2 mg/dL (ref 8.9–10.3)
Chloride: 99 mmol/L (ref 98–111)
Creatinine, Ser: 1.05 mg/dL — ABNORMAL HIGH (ref 0.44–1.00)
GFR, Estimated: >60 mL/min (ref >60)
Glucose, Bld: 141 mg/dL — ABNORMAL HIGH (ref 70–99)
Potassium: 3.1 mmol/L — ABNORMAL LOW (ref 3.5–5.1)
Sodium: 134 mmol/L — ABNORMAL LOW (ref 135–145)

## 2021-08-31 LAB — PROTIME-INR
INR: 0.9 (ref 0.8–1.2)
Prothrombin Time: 11.8 seconds (ref 11.4–15.2)

## 2021-08-31 LAB — HEPARIN LEVEL (UNFRACTIONATED): Heparin Unfractionated: 0.29 IU/mL — ABNORMAL LOW (ref 0.30–0.70)

## 2021-08-31 LAB — POCT ACTIVATED CLOTTING TIME
Activated Clotting Time: 215 seconds
Activated Clotting Time: 335 seconds
Activated Clotting Time: 354 seconds
Activated Clotting Time: 227 seconds

## 2021-08-31 LAB — MRSA NEXT GEN BY PCR, NASAL: MRSA by PCR Next Gen: NOT DETECTED

## 2021-08-31 IMAGING — XA IR TRANSCATH EMBOLIZATION
1 series · 10 of 24 positions shown · IV contrast (IODINE)
Comparison: MRA [DATE], and diagnostic catheter
arteriogram [DATE].

CLINICAL DATA: History of recently discovered unruptured anterior
communicating artery aneurysm during workup for seizures.

EXAM:
TRANSCATHETER THERAPY EMBOLIZATION
TECHNIQUE: Informed written consent was obtained from the patient after a
thorough discussion of the procedural risks, benefits and
alternatives. All questions were addressed. Maximal Sterile Barrier
Technique was utilized including caps, mask, sterile gowns, sterile
gloves, sterile drape, hand hygiene and skin antiseptic. A timeout
was performed prior to the initiation of the procedure.

[Series 300: dr. (person_name). · 10 of 347 slices shown]
[im 16/347]
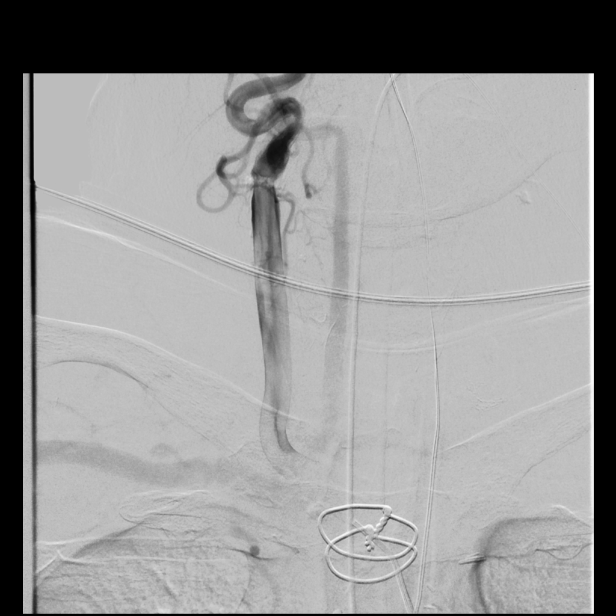
[im 46/347]
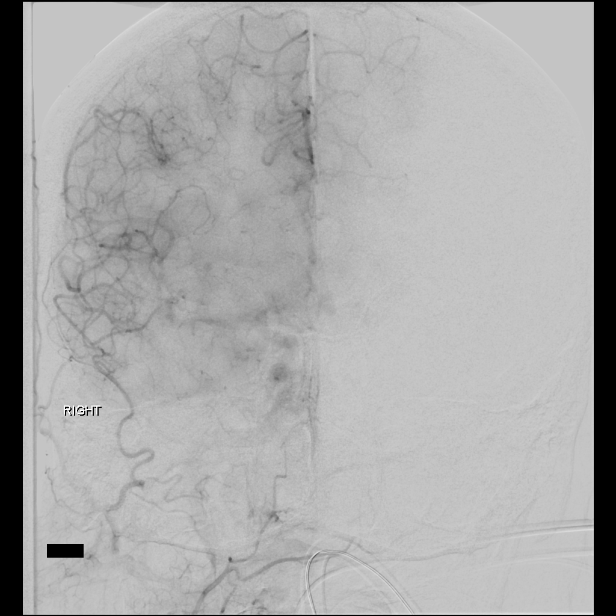
[im 91/347]
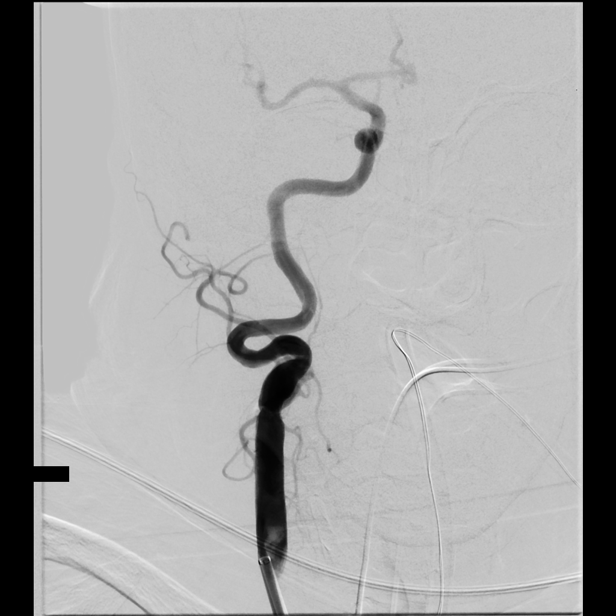
[im 121/347]
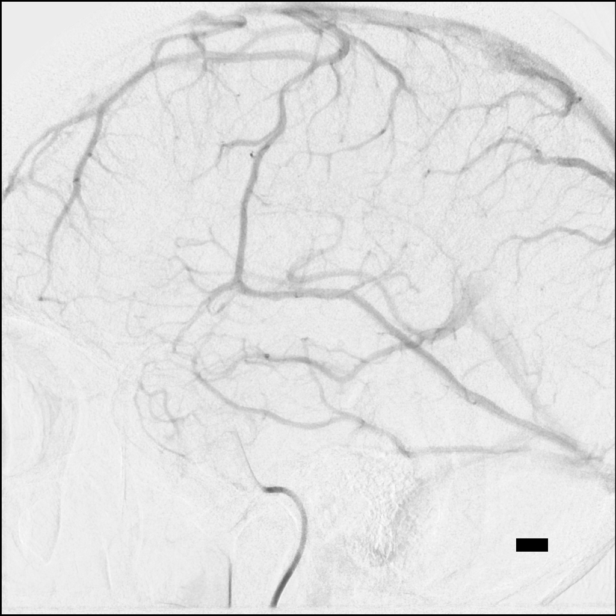
[im 151/347]
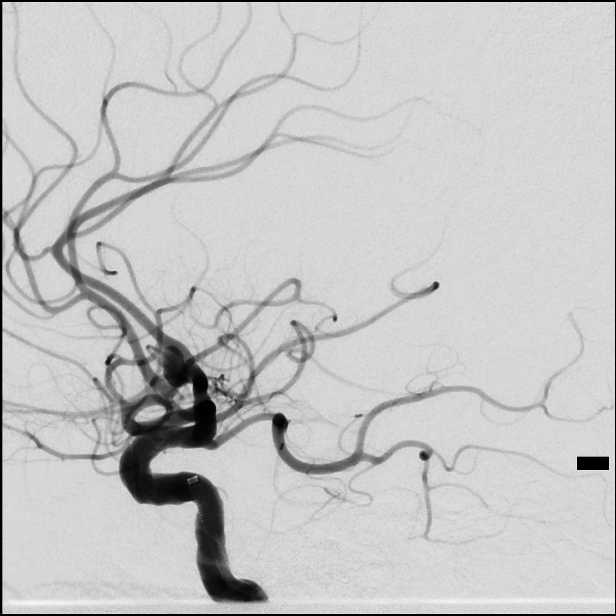
[im 196/347]
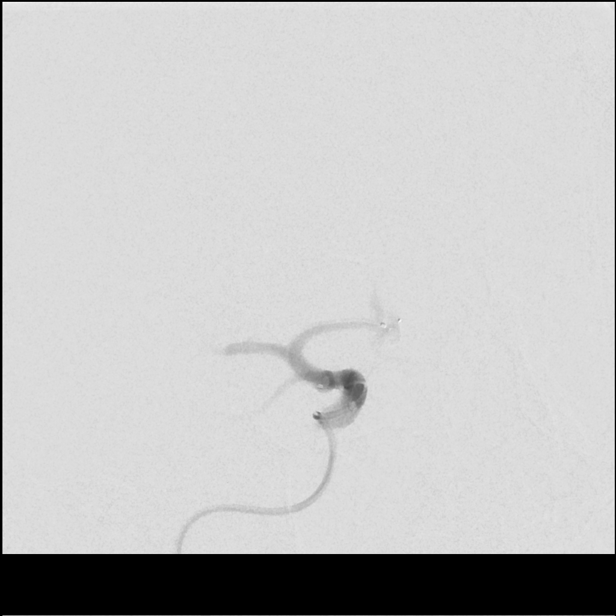
[im 226/347]
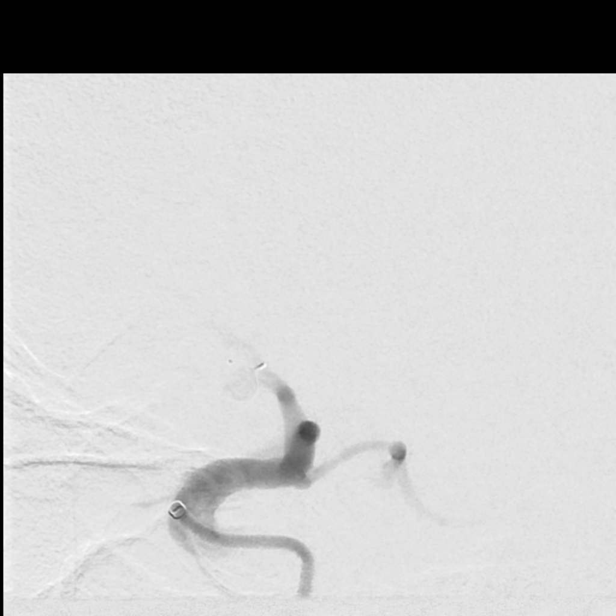
[im 271/347]
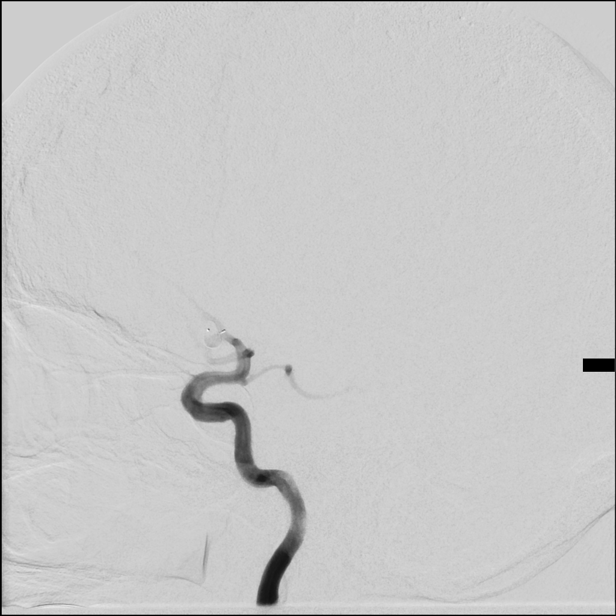
[im 301/347]
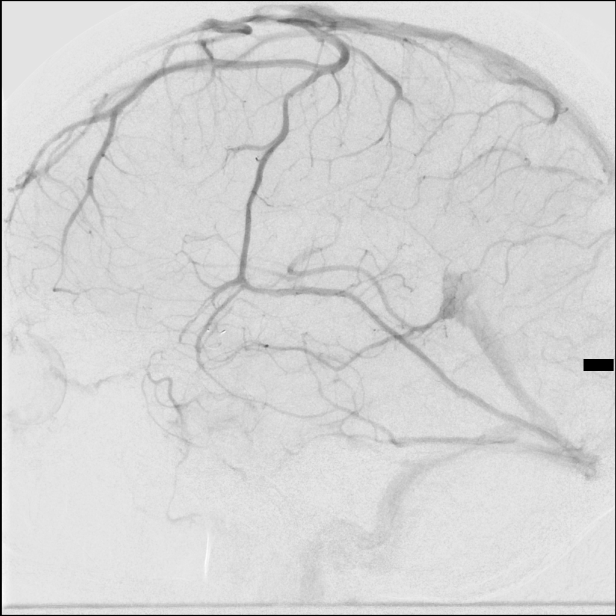
[im 331/347]
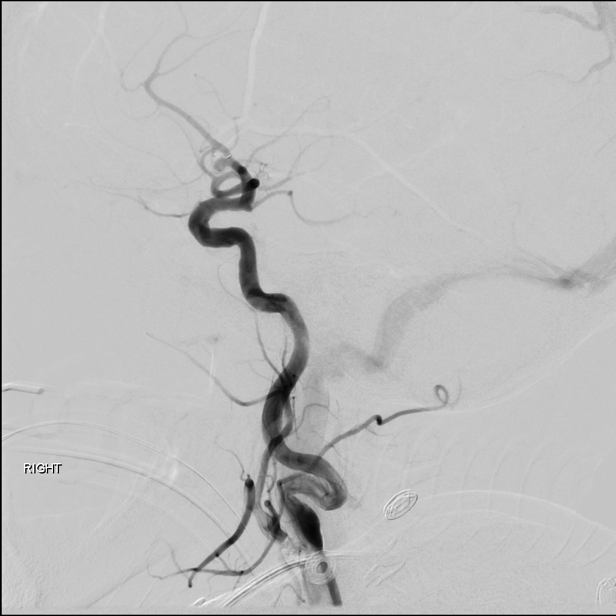

[10 of 24 positions shown; findings below may reference images not displayed]

MEDICATIONS:
Heparin 4,000 units IV. Ancef 2 g IV antibiotic was administered
within 1 hour of the procedure.

ANESTHESIA/SEDATION:
General anesthesia.

CONTRAST:  Omnipaque 300 approximately 80 cc.

FLUOROSCOPY TIME:  Fluoroscopy Time: 56 minutes 0 seconds [Q7] mGy).

COMPLICATIONS:
None immediate.
The right groin was prepped and draped in the usual sterile fashion.
Thereafter using modified Seldinger technique, transfemoral access
into the right common femoral artery was obtained without
difficulty. Over a 0.035 inch guidewire, an 8 French Pinnacle 25 cm
sheath was inserted. Through this, and also over 0.035 inch
guidewire, a 5 French JB 1 catheter was advanced to the aortic arch
region and selectively positioned in the right common carotid
artery. Arteriogram were then performed extra cranially and
intracranially. Also obtained was a 3D rotational arteriogram
followed by reconstruction of 3D images on a separate workstation.
FINDINGS: The right common carotid arteriogram demonstrates 70% stenosis at
the origin of the right external carotid artery. Its branches
opacify widely, however.

The right internal carotid artery at the bulb also demonstrates a
smooth plaque along the anterior wall without evidence of
ulcerations or of intraluminal filling defects.

Just distal to the bulb there is a U-shaped configuration of the
proximal right internal carotid artery. More distally, the vessel is
seen to opacify to the cranial skull base.

The petrous, the cavernous and the supraclinoid segments demonstrate
patency with mild fusiform dilatation of the caval segment.

The right middle and the right anterior cerebral artery opacify into
the capillary and venous phases. Prompt opacification via the
anterior communicating artery of the left anterior cerebral A2
segment is noted.

Again demonstrated is a bilobed saccular aneurysm arising from the
anterior communicating artery projecting anteriorly and to the left.

The 3D rotational arteriogram confirms the presence of the
approximately 6.6 mm x 4.2 mm bilobed saccular aneurysm with the
neck of the aneurysm arising from the anterior communicating artery.
A1 segments bilaterally remain separate from the neck of the
aneurysm.

ENDOVASCULAR EMBOLIZATION OF THE ANTERIOR COMMUNICATING ARTERY
ANEURYSM USING A WEB INTRA SACCULAR FLOW DISRUPTION DEVICE

The diagnostic JB 1 catheter in the right common carotid artery was
then exchanged over a 0.035 inch 300 cm Rosen exchange guidewire for
an 80 cm 8 French Neuron Max sheath. The guidewire was removed. Good
aspiration obtained from the hub of the Neuron Max sheath. A control
arteriogram performed through this demonstrated no evidence of
spasms, dissections or of intraluminal filling defects. Over a
Roadrunner guidewire using biplane roadmap technique and constant
fluoroscopic guidance, a 124 cm 5 French Catalyst guide catheter was
then advanced to the caval cavernous segment of the right internal
carotid artery.

The guidewire was removed. Good aspiration obtained from the hub of
the Catalyst guide catheter.

Control arteriogram was then performed which demonstrated no change
in the intracranial circulation.

Over a 0.016 inch Terumo double angled micro guidewire, an 017 Via
17 2 tip microcatheter was then advanced to the distal end of the 5
French Catalyst guide catheter in the caval segment. Using a torque
device, access was obtained into right anterior cerebral A1 segment
with the micro guidewire followed by the microcatheter. The micro
guidewire was then gently advanced under constant fluoroscopic
guidance into the aneurysm followed by the microcatheter. Micro
guidewire was removed under constant fluoroscopic surveillance
ensuring no sudden forward motion movements of the microcatheter
within the aneurysm. None was observed.

Good aspiration was obtained from the hub of the microcatheter which
was then connected to continuous heparinized saline infusion. A 7 mm
x 3 mm WEB SL intra saccular device was then chosen. This was
prepped in the usual manner and immersed in heparinized saline
infusion and captured into the delivery microcatheter.

Under constant fluoroscopic guidance, the device was then gently
advanced to the distal end of the microcatheter.

The device was then gently introduced into the aneurysm until a
flower configuration was evident fluoroscopically.

Once there, with the microcatheter being held just at the proximal
[DATE] of the aneurysm, the remainder of the device was then gently
advanced under constant fluoroscopic guidance until the device had
obtained a configuration of the aneurysm in the AP and lateral
projections. A control arteriogram was then performed via a 5 French
Catalyst guide catheter which demonstrated safe coverage at the neck
of the aneurysm with the proximal marker at the neck of the
aneurysm.

Slow flow with modest contrast stagnation was noted within the
aneurysm. Thereafter, the device was released without any change in
the configuration of the device at the neck. The micro guidewire
with the delivery microcatheter was then gently retrieved and
removed. Control arteriograms were then performed medially, and at
15 and 30 minutes post deployment of the device. These continued to
demonstrated modest stagnation with the aneurysm with no change in
the configuration of the device. No evidence of filling defect was
noted in the region of the proximal marker of the web device.
Nevertheless, the patient was given a total of 3 mg of Integrilin IA
in order to avoid any platelet aggregation at the site of the
aneurysm.

A final control arteriogram performed via the Neuron Max sheath in
the right common carotid artery continued to demonstrate free flow
in the internal carotid artery intracranially and extra cranially.
No evidence of intraluminal filling defects or of occlusion was
noted intracranially.

The patient received 15 mg of IV protamine sulfate to partially
reverse the heparin effect. The Neuron Max sheath was removed. The 8
French Pinnacle sheath was removed with successful hemostasis at the
right groin puncture site with an 8 French Angio-Seal closure
device.

Distal pulses remained Dopplerable unchanged in both feet.

A CT of the brain obtained on the table demonstrated no evidence of
intracranial hemorrhage or mass effect.

Patient's general anesthesia was then reversed and the patient was
gradually extubated. Upon recovery, the patient denied any
headaches, nausea or vomiting.

Her pupils were approximately 2-3 mm bilaterally sluggish. No facial
asymmetry was ascertained. Tongue was in the midline.

She was able to performed simple tasks, though with some effort due
to anesthesia, of her extremities.

She was then transferred to PACU and then to Neuro ICU.

Late in the evening she was more awake, alert and moving all 4
extremities equally. The right groin site was soft with distal
pulses being present.

The following morning, the patient's IV heparin was stopped and
patient was switched to aspirin 81 mg a day, and Brilinta 90 mg
b.i.d. She was then ambulated and then discharged under care of her
son. The patient was advised to maintain adequate hydration and to
refrain from stooping, bending or lifting weights above 10 pounds
for 2 weeks. She was advised to also refrain from driving for about
2 weeks.

She was also advised to contact her primary care on [REDACTED]. Patient
expressed understanding and agreement with the above management
plan.
IMPRESSION: Endovascular embolization of unruptured anterior communicating
artery aneurysm using a 7 x 3 WEB SL intra saccular flow disruption
device.

PLAN:
Follow-up in the clinic in 2 weeks time.

## 2021-08-31 SURGERY — IR WITH ANESTHESIA
Anesthesia: General

## 2021-08-31 MED ORDER — CHLORHEXIDINE GLUCONATE CLOTH 2 % EX PADS
6.0000 | MEDICATED_PAD | Freq: Every day | CUTANEOUS | Status: DC
  Administered 2021-08-31 – 2021-09-01 (×2): 6 via TOPICAL

## 2021-08-31 MED ORDER — SODIUM CHLORIDE 0.9 % IV SOLN: INTRAVENOUS | Status: DC

## 2021-08-31 MED ORDER — NITROGLYCERIN 1 MG/10 ML FOR IR/CATH LAB
INTRA_ARTERIAL | Status: AC
  Filled 2021-08-31: qty 10

## 2021-08-31 MED ORDER — IOHEXOL 300 MG/ML  SOLN
100.0000 mL | Freq: Once | INTRAMUSCULAR | Status: AC | PRN
  Administered 2021-08-31: 13:00:00 75 mL via INTRA_ARTERIAL

## 2021-08-31 MED ORDER — ACETAMINOPHEN 325 MG PO TABS: 650.0000 mg | ORAL_TABLET | ORAL | Status: DC | PRN

## 2021-08-31 MED ORDER — HEPARIN (PORCINE) 25000 UT/250ML-% IV SOLN: 450.0000 [IU]/h | INTRAVENOUS | Status: DC

## 2021-08-31 MED ORDER — PHENYLEPHRINE HCL-NACL 20-0.9 MG/250ML-% IV SOLN
INTRAVENOUS | Status: DC | PRN
  Administered 2021-08-31: 25 ug/min via INTRAVENOUS

## 2021-08-31 MED ORDER — POTASSIUM CHLORIDE CRYS ER 20 MEQ PO TBCR
40.0000 meq | EXTENDED_RELEASE_TABLET | Freq: Once | ORAL | Status: AC
  Administered 2021-08-31: 17:00:00 40 meq via ORAL
  Filled 2021-08-31: qty 2

## 2021-08-31 MED ORDER — EPTIFIBATIDE 20 MG/10ML IV SOLN
INTRAVENOUS | Status: AC
  Filled 2021-08-31: qty 10

## 2021-08-31 MED ORDER — OXYCODONE HCL 5 MG/5ML PO SOLN: 5.0000 mg | Freq: Once | ORAL | Status: DC | PRN

## 2021-08-31 MED ORDER — LACTATED RINGERS IV SOLN
INTRAVENOUS | Status: DC | PRN
Start: 2021-08-31 — End: 2021-08-31

## 2021-08-31 MED ORDER — ROCURONIUM BROMIDE 10 MG/ML (PF) SYRINGE
PREFILLED_SYRINGE | INTRAVENOUS | Status: DC | PRN
  Administered 2021-08-31: 60 mg via INTRAVENOUS
  Administered 2021-08-31: 10 mg via INTRAVENOUS

## 2021-08-31 MED ORDER — ACETAMINOPHEN 160 MG/5ML PO SOLN: 650.0000 mg | ORAL | Status: DC | PRN

## 2021-08-31 MED ORDER — NIMODIPINE 30 MG PO CAPS: 0.0000 mg | ORAL_CAPSULE | ORAL | Status: DC

## 2021-08-31 MED ORDER — PHENYLEPHRINE 40 MCG/ML (10ML) SYRINGE FOR IV PUSH (FOR BLOOD PRESSURE SUPPORT)
PREFILLED_SYRINGE | INTRAVENOUS | Status: DC | PRN
  Administered 2021-08-31 (×5): 80 ug via INTRAVENOUS

## 2021-08-31 MED ORDER — ACETAMINOPHEN 650 MG RE SUPP: 650.0000 mg | RECTAL | Status: DC | PRN

## 2021-08-31 MED ORDER — CLEVIDIPINE BUTYRATE 0.5 MG/ML IV EMUL
INTRAVENOUS | Status: AC
  Filled 2021-08-31: qty 50

## 2021-08-31 MED ORDER — HEPARIN (PORCINE) 25000 UT/250ML-% IV SOLN
550.0000 [IU]/h | INTRAVENOUS | Status: DC
  Administered 2021-08-31 (×2): 550 [IU]/h via INTRAVENOUS
  Filled 2021-08-31: qty 250

## 2021-08-31 MED ORDER — ASPIRIN EC 325 MG PO TBEC: 325.0000 mg | DELAYED_RELEASE_TABLET | ORAL | Status: DC

## 2021-08-31 MED ORDER — ASPIRIN 81 MG PO CHEW: 81.0000 mg | CHEWABLE_TABLET | Freq: Every day | ORAL | Status: DC

## 2021-08-31 MED ORDER — OXYCODONE HCL 5 MG PO TABS: 5.0000 mg | ORAL_TABLET | Freq: Once | ORAL | Status: DC | PRN

## 2021-08-31 MED ORDER — HEPARIN (PORCINE) 25000 UT/250ML-% IV SOLN
INTRAVENOUS | Status: AC
  Administered 2021-08-31: 14:00:00 500 [IU]/h via INTRAVENOUS
  Filled 2021-08-31: qty 250

## 2021-08-31 MED ORDER — HEPARIN SODIUM (PORCINE) 1000 UNIT/ML IJ SOLN
INTRAMUSCULAR | Status: DC | PRN
  Administered 2021-08-31: 1000 [IU] via INTRAVENOUS
  Administered 2021-08-31: 3000 [IU] via INTRAVENOUS

## 2021-08-31 MED ORDER — CEFAZOLIN SODIUM-DEXTROSE 2-4 GM/100ML-% IV SOLN
2.0000 g | INTRAVENOUS | Status: AC
  Administered 2021-08-31: 09:00:00 2 g via INTRAVENOUS
  Filled 2021-08-31: qty 100

## 2021-08-31 MED ORDER — CLEVIDIPINE BUTYRATE 0.5 MG/ML IV EMUL: 0.0000 mg/h | INTRAVENOUS | Status: DC

## 2021-08-31 MED ORDER — TICAGRELOR 90 MG PO TABS
90.0000 mg | ORAL_TABLET | Freq: Two times a day (BID) | ORAL | Status: DC
  Administered 2021-08-31 – 2021-09-01 (×2): 90 mg via ORAL
  Filled 2021-08-31 (×2): qty 1

## 2021-08-31 MED ORDER — CHLORHEXIDINE GLUCONATE 0.12 % MT SOLN
OROMUCOSAL | Status: AC
  Administered 2021-08-31: 07:00:00 15 mL
  Filled 2021-08-31: qty 15

## 2021-08-31 MED ORDER — FENTANYL CITRATE (PF) 250 MCG/5ML IJ SOLN
INTRAMUSCULAR | Status: DC | PRN
  Administered 2021-08-31: 100 ug via INTRAVENOUS

## 2021-08-31 MED ORDER — LACTATED RINGERS IV SOLN: INTRAVENOUS | Status: DC | PRN

## 2021-08-31 MED ORDER — DEXAMETHASONE SODIUM PHOSPHATE 10 MG/ML IJ SOLN
INTRAMUSCULAR | Status: DC | PRN
  Administered 2021-08-31: 10 mg via INTRAVENOUS

## 2021-08-31 MED ORDER — TICAGRELOR 90 MG PO TABS: 90.0000 mg | ORAL_TABLET | Freq: Two times a day (BID) | ORAL | Status: DC

## 2021-08-31 MED ORDER — PROPOFOL 10 MG/ML IV BOLUS
INTRAVENOUS | Status: DC | PRN
  Administered 2021-08-31: 110 mg via INTRAVENOUS

## 2021-08-31 MED ORDER — HEPARIN (PORCINE) 25000 UT/250ML-% IV SOLN: 500.0000 [IU]/h | INTRAVENOUS | Status: DC

## 2021-08-31 MED ORDER — ASPIRIN 81 MG PO CHEW
81.0000 mg | CHEWABLE_TABLET | Freq: Every day | ORAL | Status: DC
  Administered 2021-09-01: 81 mg via ORAL
  Filled 2021-08-31: qty 1

## 2021-08-31 MED ORDER — EPHEDRINE SULFATE-NACL 50-0.9 MG/10ML-% IV SOSY
PREFILLED_SYRINGE | INTRAVENOUS | Status: DC | PRN
Start: 2021-08-31 — End: 2021-08-31
  Administered 2021-08-31: 5 mg via INTRAVENOUS

## 2021-08-31 MED ORDER — PROTAMINE SULFATE 10 MG/ML IV SOLN
INTRAVENOUS | Status: DC | PRN
  Administered 2021-08-31: 5 mg via INTRAVENOUS
  Administered 2021-08-31: 10 mg via INTRAVENOUS

## 2021-08-31 MED ORDER — SUGAMMADEX SODIUM 200 MG/2ML IV SOLN
INTRAVENOUS | Status: DC | PRN
  Administered 2021-08-31: 100 mg via INTRAVENOUS
  Administered 2021-08-31: 175 mg via INTRAVENOUS
  Administered 2021-08-31: 25 mg via INTRAVENOUS

## 2021-08-31 MED ORDER — LIDOCAINE HCL 1 % IJ SOLN
INTRAMUSCULAR | Status: AC
  Filled 2021-08-31: qty 20

## 2021-08-31 MED ORDER — EPTIFIBATIDE 20 MG/10ML IV SOLN
INTRAVENOUS | Status: AC | PRN
  Administered 2021-08-31 (×2): 1.5 mg via INTRAVENOUS

## 2021-08-31 MED ORDER — IOHEXOL 300 MG/ML  SOLN
100.0000 mL | Freq: Once | INTRAMUSCULAR | Status: AC | PRN
  Administered 2021-08-31: 13:00:00 10 mL via INTRA_ARTERIAL

## 2021-08-31 MED ORDER — ESMOLOL HCL 100 MG/10ML IV SOLN
INTRAVENOUS | Status: DC | PRN
  Administered 2021-08-31: 30 mg via INTRAVENOUS
  Administered 2021-08-31: 20 mg via INTRAVENOUS
  Administered 2021-08-31: 30 mg via INTRAVENOUS

## 2021-08-31 MED ORDER — TICAGRELOR 90 MG PO TABS: 90.0000 mg | ORAL_TABLET | ORAL | Status: DC

## 2021-08-31 MED ORDER — ONDANSETRON HCL 4 MG/2ML IJ SOLN
INTRAMUSCULAR | Status: DC | PRN
  Administered 2021-08-31: 4 mg via INTRAVENOUS

## 2021-08-31 MED ORDER — FENTANYL CITRATE (PF) 100 MCG/2ML IJ SOLN: 25.0000 ug | INTRAMUSCULAR | Status: DC | PRN

## 2021-08-31 MED ORDER — LIDOCAINE 2% (20 MG/ML) 5 ML SYRINGE
INTRAMUSCULAR | Status: DC | PRN
Start: 2021-08-31 — End: 2021-08-31
  Administered 2021-08-31: 60 mg via INTRAVENOUS

## 2021-08-31 MED ORDER — ONDANSETRON HCL 4 MG/2ML IJ SOLN: 4.0000 mg | Freq: Four times a day (QID) | INTRAMUSCULAR | Status: DC | PRN

## 2021-08-31 NOTE — Progress Notes (Signed)
Patient seen in 4N26 following today's procedure. She is alert, oriented and following commands. Able to move all extremities. Her left wrist is very sore/tender from multiple a-line attempts. Vital signs are stable. Right groin vascular site is currently clean, soft and dry. Minimal tenderness.   Patient with bedrest orders placed. Clear liquid diet tonight. Foley to remain overnight. IR will plan to see patient tomorrow morning for possible discharge home.   Please call IR with any questions.   Soyla Dryer, [place] [phone] 08/31/2021, 4:58 PM

## 2021-08-31 NOTE — Transfer of Care (Addendum)
Immediate Anesthesia Transfer of Care Note  Patient: Teresa [name]  Procedure(s) Performed: IR WITH ANESTHESIA EMBOLIZATION IR TRANSCATH/EMBOLIZ IR 3D INDEPENDENT WKST IR ANGIO INTRA EXTRACRAN SEL INTERNAL CAROTID UNI R MOD SED IR ANGIOGRAM FOLLOW UP STUDY  Patient Location: PACU  Anesthesia Type:General  Level of Consciousness: awake, oriented and patient cooperative  Airway & Oxygen Therapy: Patient Spontanous Breathing and Patient connected to nasal cannula oxygen  Post-op Assessment: Report given to RN and Post -op Vital signs reviewed and stable  Post vital signs: Reviewed  Last Vitals:  Vitals Value Taken Time  BP 116/65 08/31/21 1409  Temp    Pulse 68 08/31/21 1409  Resp 14 08/31/21 1409  SpO2 100 % 08/31/21 1409  Vitals shown include unvalidated device data.  Last Pain:  Vitals:   08/31/21 0724  TempSrc:   PainSc: 0-No pain         Complications: No notable events documented.

## 2021-08-31 NOTE — Anesthesia Postprocedure Evaluation (Signed)
Anesthesia Post Note  Patient: Teresa [name]  Procedure(s) Performed: IR WITH ANESTHESIA EMBOLIZATION IR TRANSCATH/EMBOLIZ IR 3D INDEPENDENT WKST IR ANGIO INTRA EXTRACRAN SEL INTERNAL CAROTID UNI R MOD SED IR ANGIOGRAM FOLLOW UP STUDY     Patient location during evaluation: PACU Anesthesia Type: General Level of consciousness: awake and alert Pain management: pain level controlled Vital Signs Assessment: post-procedure vital signs reviewed and stable Respiratory status: spontaneous breathing, nonlabored ventilation, respiratory function stable and patient connected to nasal cannula oxygen Cardiovascular status: blood pressure returned to baseline and stable Postop Assessment: no apparent nausea or vomiting Anesthetic complications: no   No notable events documented.  Last Vitals:  Vitals:   08/31/21 1845 08/31/21 1900  BP:  (!) 127/58  Pulse: 61 62  Resp: 16 14  Temp:    SpO2: 100% 100%    Last Pain:  Vitals:   08/31/21 1525  TempSrc:   PainSc: 3                  Beryle Lathe

## 2021-08-31 NOTE — Anesthesia Procedure Notes (Signed)
Procedure Name: Intubation Date/Time: 08/31/2021 9:03 AM Performed by: Lovie Chol, CRNA Pre-anesthesia Checklist: Patient identified, Emergency Drugs available, Suction available and Patient being monitored Patient Re-evaluated:Patient Re-evaluated prior to induction Oxygen Delivery Method: Circle System Utilized Preoxygenation: Pre-oxygenation with 100% oxygen Induction Type: IV induction Ventilation: Mask ventilation without difficulty and Oral airway inserted - appropriate to patient size Laryngoscope Size: Hyacinth Meeker and 2 Grade View: Grade I Tube type: Oral Tube size: 7.0 mm Number of attempts: 1 Airway Equipment and Method: Stylet and Oral airway Placement Confirmation: ETT inserted through vocal cords under direct vision, positive ETCO2 and breath sounds checked- equal and bilateral Secured at: 21 cm Tube secured with: Tape Dental Injury: Teeth and Oropharynx as per pre-operative assessment

## 2021-08-31 NOTE — Sedation Documentation (Signed)
ACT 227 notified Dr Corliss Skains

## 2021-08-31 NOTE — Sedation Documentation (Signed)
ACT 354 notified Dr Corliss Skains

## 2021-08-31 NOTE — Sedation Documentation (Signed)
ACT 215 notified Dr Corliss Skains

## 2021-08-31 NOTE — Progress Notes (Signed)
ANTICOAGULATION CONSULT NOTE   Pharmacy Consult:  Heparin Indication: Post interventional neuroradiology procedure  No Known Allergies  Patient Measurements: Height: 4\' 11"  (149.9 cm) Weight: 79.4 kg (175 lb) IBW/kg (Calculated) : 43.2 Heparin Dosing Weight: 64kg  Vital Signs: Temp: 98.5 F (36.9 C) (01/25 2000) Temp Source: Oral (01/25 2000) BP: 102/53 (01/25 2300) Pulse Rate: 68 (01/25 2300)  Labs: Recent Labs    08/31/21 0616 08/31/21 2318  HGB 13.2  --   HCT 40.0  --   PLT 425*  --   LABPROT 11.8  --   INR 0.9  --   HEPARINUNFRC  --  0.29*  CREATININE 1.05*  --      Estimated Creatinine Clearance: 51.3 mL/min (A) (by C-G formula based on SCr of 1.05 mg/dL (H)).   Medical History: Past Medical History:  Diagnosis Date   Acute on chronic respiratory failure with hypoxia (HCC)    Acute renal failure due to tubular necrosis [facility])    Atrial flutter (HCC)    Cardiogenic shock (HCC)    Coronary artery disease 08/13/2020   Coronary artery disease involving native coronary artery of native heart    Coronary artery disease involving native coronary artery of native heart without angina pectoris 09/17/2020   Depressed left ventricular ejection fraction 08/13/2020   Depression, unspecified 10/09/2015   Essential hypertension 10/06/2015   Gastro-esophageal reflux disease without esophagitis 10/06/2015   HFrEF (heart failure with reduced ejection fraction) ([place]) 08/13/2020   History of tonic-clonic seizures 04/25/2020   Hyperlipidemia 10/06/2015   Ischemic cardiomyopathy 08/13/2020   Mixed hyperlipidemia 09/17/2020   Pericardial effusion 08/13/2020   Post operative atrial fibrillation ([place]) 08/13/2020   Seizure disorder ([place]) 03/03/2016   Shortness of breath 04/25/2020   Syncope 04/25/2020   Tobacco use 08/13/2020    Assessment: 89 YOF with aneurysm now s/p embolization.  Heparin started in PACU.  Baseline labs reviewed.  1/25 PM update:  Heparin level above  goal  Goal of Therapy:  Heparin level 0.1-0.25 units/ml Monitor platelets by anticoagulation protocol: Yes   Plan:  Dec heparin to 450 units/hr Heparin off at 0800 1/26 AM per order admin instructions    Narda Bonds, PharmD, [place] Pharmacist Phone: [phone]

## 2021-08-31 NOTE — Progress Notes (Signed)
Radiology was notified that pt's right groin was bleeding at 1400 hrs. I came up to the PACU at this time and identified the pt by name/d.o.b. I applied a quikclot dressing to the site at 1410 hrs and held manual pressure for 15 minutes. Hemostasis was achieved at 1425 hrs. Hemostasis and the distal pulses (doppler) were verified by Marquita Palms and myself. Gauze/Tegaderm was applied to the groin site at this time. Quikclot dressing will need to come off after 24 hrs.

## 2021-08-31 NOTE — Procedures (Signed)
INR. S/P RT common carotid artreriogram. RT CFA approach. S/P embolization of ACOM aneurysm with a  7x 3 mm SL WEB . Post CT brain Ni ICH . 59F angioseal closure at RT CFA site. Distal pulses dopplerable. Extubated.slow in waking up. Pupils 2 to 52mm Rt = LT .(Rt sided ptosis ) No facial asymmetry. Tongue midline.  Moves all 4s to command. ?mild weakness Lt UE old. S.Milayna Rotenberg MD

## 2021-08-31 NOTE — Progress Notes (Signed)
ANTICOAGULATION CONSULT NOTE - Initial Consult  Pharmacy Consult:  Heparin Indication: Post interventional neuroradiology procedure  No Known Allergies  Patient Measurements: Height: 4\' 11"  (149.9 cm) Weight: 79.4 kg (175 lb) IBW/kg (Calculated) : 43.2 Heparin Dosing Weight: 64kg  Vital Signs: Temp: 98 F (36.7 C) (01/25 1340) Temp Source: Oral (01/25 0659) BP: 131/63 (01/25 1425) Pulse Rate: 65 (01/25 1425)  Labs: Recent Labs    08/31/21 0616  HGB 13.2  HCT 40.0  PLT 425*  LABPROT 11.8  INR 0.9  CREATININE 1.05*    Estimated Creatinine Clearance: 51.3 mL/min (A) (by C-G formula based on SCr of 1.05 mg/dL (H)).   Medical History: Past Medical History:  Diagnosis Date   Acute on chronic respiratory failure with hypoxia (HCC)    Acute renal failure due to tubular necrosis [facility])    Atrial flutter (HCC)    Cardiogenic shock (HCC)    Coronary artery disease 08/13/2020   Coronary artery disease involving native coronary artery of native heart    Coronary artery disease involving native coronary artery of native heart without angina pectoris 09/17/2020   Depressed left ventricular ejection fraction 08/13/2020   Depression, unspecified 10/09/2015   Essential hypertension 10/06/2015   Gastro-esophageal reflux disease without esophagitis 10/06/2015   HFrEF (heart failure with reduced ejection fraction) ([place]) 08/13/2020   History of tonic-clonic seizures 04/25/2020   Hyperlipidemia 10/06/2015   Ischemic cardiomyopathy 08/13/2020   Mixed hyperlipidemia 09/17/2020   Pericardial effusion 08/13/2020   Post operative atrial fibrillation ([place]) 08/13/2020   Seizure disorder ([place]) 03/03/2016   Shortness of breath 04/25/2020   Syncope 04/25/2020   Tobacco use 08/13/2020    [name].  Heparin started in PACU.  Baseline labs reviewed.  Goal of Therapy:  Heparin level 0.1-0.25 units/ml Monitor platelets by anticoagulation  protocol: Yes   Plan:  Increase heparin gtt to 550 units/hr - stop in AM per protocol Check 6 hr heparin level KCL 80mEq PO   Teresa [name] D. Mina Marble, PharmD, BCPS, [place] 08/31/2021, 3:03 PM

## 2021-08-31 NOTE — Sedation Documentation (Signed)
ACT 335 notified Dr Corliss Skains

## 2021-09-01 ENCOUNTER — Encounter (HOSPITAL_COMMUNITY): Payer: Self-pay | Admitting: Interventional Radiology

## 2021-09-01 DIAGNOSIS — I671 Cerebral aneurysm, nonruptured: Secondary | ICD-10-CM | POA: Diagnosis not present

## 2021-09-01 LAB — CBC WITH DIFFERENTIAL/PLATELET
Abs Immature Granulocytes: 0.05 10*3/uL (ref 0.00–0.07)
Basophils Absolute: 0 10*3/uL (ref 0.0–0.1)
Basophils Relative: 0 %
Eosinophils Absolute: 0 10*3/uL (ref 0.0–0.5)
Eosinophils Relative: 0 %
HCT: 29.9 % — ABNORMAL LOW (ref 36.0–46.0)
Hemoglobin: 9.9 g/dL — ABNORMAL LOW (ref 12.0–15.0)
Immature Granulocytes: 1 %
Lymphocytes Relative: 9 %
Lymphs Abs: 1 10*3/uL (ref 0.7–4.0)
MCH: 33 pg (ref 26.0–34.0)
MCHC: 33.1 g/dL (ref 30.0–36.0)
MCV: 99.7 fL (ref 80.0–100.0)
Monocytes Absolute: 0.3 10*3/uL (ref 0.1–1.0)
Monocytes Relative: 3 %
Neutro Abs: 9.7 10*3/uL — ABNORMAL HIGH (ref 1.7–7.7)
Neutrophils Relative %: 87 %
Platelets: 345 10*3/uL (ref 150–400)
RBC: 3 MIL/uL — ABNORMAL LOW (ref 3.87–5.11)
RDW: 12.4 % (ref 11.5–15.5)
WBC: 11 10*3/uL — ABNORMAL HIGH (ref 4.0–10.5)
nRBC: 0 % (ref 0.0–0.2)

## 2021-09-01 LAB — BASIC METABOLIC PANEL
Anion gap: 3 — ABNORMAL LOW (ref 5–15)
BUN: 8 mg/dL (ref 8–23)
CO2: 24 mmol/L (ref 22–32)
Calcium: 7.8 mg/dL — ABNORMAL LOW (ref 8.9–10.3)
Chloride: 110 mmol/L (ref 98–111)
Creatinine, Ser: 0.88 mg/dL (ref 0.44–1.00)
GFR, Estimated: >60 mL/min (ref >60)
Glucose, Bld: 157 mg/dL — ABNORMAL HIGH (ref 70–99)
Potassium: 3.7 mmol/L (ref 3.5–5.1)
Sodium: 137 mmol/L (ref 135–145)

## 2021-09-01 NOTE — Progress Notes (Signed)
Patient ambulated in room and hallway using walker on room air and mainly standby assistance.  Occasionally, the patient needed assistance with turning using the walker due to left hand/ finger numbness.  MD and PA notified.

## 2021-09-01 NOTE — Progress Notes (Signed)
Patient seen in 4N26. She is alert, oriented and following commands. Able to move all extremities. Her left wrist is stil;l very sore/tender from PIV site. Vital signs are stable.  Right groin vascular site is currently clean, soft and dry. Minimal tenderness. No hematoma. Steristrips applied to approximate wound edges    Plan for Foley and IV removal. Pt to get her po meds. Encourage po intake OOB to chair.  Anticipate discharge later this morning   Please call IR with any questions.   Ascencion Dike PA-C Interventional Radiology 09/01/2021 9:11 AM

## 2021-09-01 NOTE — Discharge Summary (Signed)
Physician Discharge Summary      Patient ID: Teresa [name] MRN: [id number] DOB/AGE: 10/01/1960 61 y.o.  Admit date: 08/31/2021 Discharge date: 09/01/2021  Admission Diagnoses: Principal Problem:   Brain aneurysm  Discharge Diagnoses:  Principal Problem:   Brain aneurysm    Procedures: Procedure(s): IR WITH ANESTHESIA EMBOLIZATION  Discharged Condition: good  Hospital Course: Admitted to Neuro ICU after successful angiogram with embolization of ACOM aneurysm with a  7x 3 mm SL WEB device. No immediate complications. Stable overnight, primary c/o was left wrist pain secondary to [facility] location. Denies HA, N/V, blurred vision. No motor defects noted. Has received ASA/Brilinta as directed. Determined stable for discharge.  All medications, restrictions, and follow up plans reviewed with pt  Consults: None   Discharge Exam: Blood pressure 126/60, pulse 68, temperature 97.9 F (36.6 C), temperature source Oral, resp. rate 16, height 4\' 11"  (1.499 m), weight 79.4 kg, SpO2 98 %. NAD A&O x 3 Pupils 2 to 29mm Rt = LT .(Rt sided ptosis ) No facial asymmetry. Tongue midline. Moves all 4s to command.  ?mild weakness Lt UE old. Heart: Regul;ar Lungs: CTA Ext: (R)groin soft, NT, no hematoma Distal pulses + by doppler   Disposition: Discharge disposition: 01-Home or Self Care       Discharge Instructions     Diet - low sodium heart healthy   Complete by: As directed    Driving Restrictions   Complete by: As directed    Avoid driving for 2 weeks if possible   Increase activity slowly   Complete by: As directed    Lifting restrictions   Complete by: As directed    Avoid heavy lifting, pushing, pulling greater than 10lbs for 2 weeks if possible   Remove dressing in 24 hours   Complete by: As directed       Allergies as of 09/01/2021   No Known Allergies      Medication List     TAKE these medications    acetaminophen 500 MG tablet Commonly known as:  TYLENOL Take 1,000 mg by mouth every 6 (six) hours as needed for moderate pain or mild pain.   albuterol 108 (90 Base) MCG/ACT inhaler Commonly known as: VENTOLIN HFA Inhale 1 puff into the lungs every 6 (six) hours as needed for wheezing or shortness of breath.   Aspirin 325 MG Caps Take 81 tablets by mouth daily.   atorvastatin 40 MG tablet Commonly known as: LIPITOR Take 40 mg by mouth daily.   Brilinta 90 MG Tabs tablet Generic drug: ticagrelor Take 1 tablet (90 mg total) by mouth 2 (two) times daily.   citalopram 20 MG tablet Commonly known as: CELEXA Take 20 mg by mouth daily.   ezetimibe 10 MG tablet Commonly known as: ZETIA Take 1 tablet (10 mg total) by mouth daily.   ferrous sulfate 324 MG Tbec Take 324 mg by mouth daily with breakfast.   Fish Oil 1000 MG Caps Take 1,000 mg by mouth daily.   furosemide 40 MG tablet Commonly known as: LASIX Take 0.5 tablets (20 mg total) by mouth daily. What changed: when to take this   levETIRAcetam 500 MG 24 hr tablet Commonly known as: KEPPRA XR Take 3 tablets daily What changed:  how much to take when to take this additional instructions   meloxicam 7.5 MG tablet Commonly known as: MOBIC Take 7.5 mg by mouth 2 (two) times daily as needed for pain.   metoprolol succinate 25 MG 24 hr tablet  Commonly known as: TOPROL-XL Take 0.5 tablets (12.5 mg total) by mouth daily.   pantoprazole 40 MG tablet Commonly known as: PROTONIX Take 40 mg by mouth daily.   PREBIOTIC PRODUCT PO Take 1 tablet by mouth daily.   traMADol 50 MG tablet Commonly known as: ULTRAM Take 50 mg by mouth 3 (three) times daily as needed for pain.   Vitamin D 50 MCG (2000 UT) tablet Take 2,000 Units by mouth daily.   zolpidem 10 MG tablet Commonly known as: AMBIEN Take 10 mg by mouth at bedtime.        Follow-up Information     Luanne Bras, MD. Go in 2 week(s).   Specialties: Interventional Radiology, Radiology Why: You may  also call [phone] with questions or concerns. Contact information: [address] [place] [address] [id number]                 Signed: Ascencion Dike PA-C 09/01/2021, 11:23 AM

## 2021-09-03 ENCOUNTER — Other Ambulatory Visit: Payer: Self-pay | Admitting: Cardiology

## 2021-09-05 DIAGNOSIS — I10 Essential (primary) hypertension: Secondary | ICD-10-CM | POA: Diagnosis not present

## 2021-09-05 DIAGNOSIS — N1831 Chronic kidney disease, stage 3a: Secondary | ICD-10-CM | POA: Diagnosis not present

## 2021-09-05 DIAGNOSIS — G5692 Unspecified mononeuropathy of left upper limb: Secondary | ICD-10-CM | POA: Diagnosis not present

## 2021-09-05 DIAGNOSIS — M159 Polyosteoarthritis, unspecified: Secondary | ICD-10-CM | POA: Diagnosis not present

## 2021-09-05 DIAGNOSIS — K219 Gastro-esophageal reflux disease without esophagitis: Secondary | ICD-10-CM | POA: Diagnosis not present

## 2021-09-05 DIAGNOSIS — R69 Illness, unspecified: Secondary | ICD-10-CM | POA: Diagnosis not present

## 2021-09-05 DIAGNOSIS — G40909 Epilepsy, unspecified, not intractable, without status epilepticus: Secondary | ICD-10-CM | POA: Diagnosis not present

## 2021-09-05 DIAGNOSIS — F419 Anxiety disorder, unspecified: Secondary | ICD-10-CM | POA: Diagnosis not present

## 2021-09-05 DIAGNOSIS — D649 Anemia, unspecified: Secondary | ICD-10-CM | POA: Diagnosis not present

## 2021-09-05 DIAGNOSIS — J45909 Unspecified asthma, uncomplicated: Secondary | ICD-10-CM | POA: Diagnosis not present

## 2021-09-05 DIAGNOSIS — I2510 Atherosclerotic heart disease of native coronary artery without angina pectoris: Secondary | ICD-10-CM | POA: Diagnosis not present

## 2021-09-05 DIAGNOSIS — E7800 Pure hypercholesterolemia, unspecified: Secondary | ICD-10-CM | POA: Diagnosis not present

## 2021-09-06 ENCOUNTER — Telehealth: Payer: Self-pay

## 2021-09-06 NOTE — Telephone Encounter (Signed)
Called pt to set her up with an appt due to her being due for her 6 month follow up.

## 2021-09-06 NOTE — Telephone Encounter (Signed)
Refill sent to pharmacy.   

## 2021-09-12 ENCOUNTER — Other Ambulatory Visit (HOSPITAL_COMMUNITY): Payer: Self-pay | Admitting: Interventional Radiology

## 2021-09-12 DIAGNOSIS — I671 Cerebral aneurysm, nonruptured: Secondary | ICD-10-CM

## 2021-09-13 ENCOUNTER — Telehealth: Payer: Self-pay | Admitting: Cardiology

## 2021-09-13 MED ORDER — EZETIMIBE 10 MG PO TABS: 10.0000 mg | ORAL_TABLET | Freq: Every day | ORAL | 5 refills | Status: DC

## 2021-09-13 NOTE — Telephone Encounter (Signed)
°*  STAT* If patient is at the pharmacy, call can be transferred to refill team.   1. Which medications need to be refilled? (please list name of each medication and dose if known)  ezetimibe (ZETIA) 10 MG tablet  2. Which pharmacy/location (including street and city if local pharmacy) is medication to be sent to? [facility] - [address] [place], [place], [place] [address]  3. Do they need a 30 day or 90 day supply?  30 day supply  Patient states she switched pharmacies and she is requesting to have her most recent Rx transferred to the pharmacy listed above.

## 2021-09-13 NOTE — Telephone Encounter (Signed)
Refill sent for Zetia 10 mg to [facility] [place].

## 2021-09-14 ENCOUNTER — Encounter: Payer: Self-pay | Admitting: Neurology

## 2021-09-14 ENCOUNTER — Ambulatory Visit: Payer: 59 | Admitting: Neurology

## 2021-09-14 ENCOUNTER — Other Ambulatory Visit: Payer: Self-pay

## 2021-09-14 VITALS — BP 136/93 | HR 77 | Ht <= 58 in | Wt 171.8 lb

## 2021-09-14 DIAGNOSIS — R29898 Other symptoms and signs involving the musculoskeletal system: Secondary | ICD-10-CM | POA: Diagnosis not present

## 2021-09-14 DIAGNOSIS — G40009 Localization-related (focal) (partial) idiopathic epilepsy and epileptic syndromes with seizures of localized onset, not intractable, without status epilepticus: Secondary | ICD-10-CM | POA: Diagnosis not present

## 2021-09-14 MED ORDER — LEVETIRACETAM ER 500 MG PO TB24: ORAL_TABLET | ORAL | 11 refills | Status: DC
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
## 2021-09-14 NOTE — Patient Instructions (Signed)
Good to see you.  Continue Keppra XR 500mg : take 2 tablets in AM, 1 tablet in PM. Refills sent to [facility] in [place], [place]  2. Referral will be sent to occupational therapy in [place]  3. Follow-up in 6 months, call for any changes   Seizure Precautions: 1. If medication has been prescribed for you to prevent seizures, take it exactly as directed.  Do not stop taking the medicine without talking to your doctor first, even if you have not had a seizure in a long time.   2. Avoid activities in which a seizure would cause danger to yourself or to others.  Don't operate dangerous machinery, swim alone, or climb in high or dangerous places, such as on ladders, roofs, or girders.  Do not drive unless your doctor says you may.  3. If you have any warning that you may have a seizure, lay down in a safe place where you can't hurt yourself.    4.  No driving for 6 months from last seizure, as per [facility].   Please refer to the following link on the Epilepsy Foundation of America's website for more information: http://www.epilepsyfoundation.org/answerplace/Social/driving/drivingu.cfm   5.  Maintain good sleep hygiene. Avoid alcohol.  6.  Contact your doctor if you have any problems that may be related to the medicine you are taking.  7.  Call 911 and bring the patient back to the ED if:        A.  The seizure lasts longer than 5 minutes.       B.  The patient doesn't awaken shortly after the seizure  C.  The patient has new problems such as difficulty seeing, speaking or moving  D.  The patient was injured during the seizure  E.  The patient has a temperature over 102 F (39C)  F.  The patient vomited and now is having trouble breathing

## 2021-09-15 ENCOUNTER — Ambulatory Visit (HOSPITAL_COMMUNITY)
Admission: RE | Admit: 2021-09-15 | Discharge: 2021-09-15 | Disposition: A | Discharge status: Home / Self Care | Payer: 59 | Source: Ambulatory Visit | Attending: Interventional Radiology | Admitting: Interventional Radiology

## 2021-09-15 DIAGNOSIS — I671 Cerebral aneurysm, nonruptured: Secondary | ICD-10-CM

## 2021-09-15 DIAGNOSIS — M1990 Unspecified osteoarthritis, unspecified site: Secondary | ICD-10-CM | POA: Diagnosis not present

## 2021-09-16 HISTORY — PX: IR RADIOLOGIST EVAL & MGMT: IMG5224

## 2021-09-21 ENCOUNTER — Other Ambulatory Visit: Payer: Self-pay

## 2021-09-21 DIAGNOSIS — R29898 Other symptoms and signs involving the musculoskeletal system: Secondary | ICD-10-CM

## 2021-10-04 NOTE — Progress Notes (Signed)
Cardiology Office Note:    Date:  10/05/2021   ID:  Teresa [name], DOB [birth year], MRN [id number]  PCP:  Teresa Curia, MD  Cardiologist:  Teresa Herrlich, MD    Referring MD: Teresa Curia, MD    ASSESSMENT:    1. Coronary artery disease involving native coronary artery of native heart without angina pectoris   2. Hx of CABG   3. Hypertensive heart disease with chronic combined systolic and diastolic congestive heart failure (HCC)   4. Mixed hyperlipidemia    PLAN:    In order of problems listed above:  She has made a good recovery from her very complex cardiac intervention CABG cardiogenic shock, prolonged respiratory failure.  Currently New York Heart Association class I from a cardiology perspective she will stay on long-term aspirin when she stops her short-term Brilinta her high intensity statin and Zetia and her beta-blocker. Stable blood pressure heart failure compensated no longer on a loop diuretic. Continue current lipid-lowering treatment LDL is at target    Next appointment: 9 months   Medication Adjustments/Labs and Tests Ordered: Current medicines are reviewed at length with the patient today.  Concerns regarding medicines are outlined above.  No orders of the defined types were placed in this encounter.  No orders of the defined types were placed in this encounter.   Chief Complaint  Patient presents with   Follow-up   Coronary Artery Disease   Congestive Heart Failure    History of Present Illness:    Teresa [name] is a 61 y.o. female with a hx of CAD with CABG April 25, 2020 cardiomyopathy EF initially 25 to 30% most recent in February 2020 to 40 to 45% hypertensive heart disease hyperlipidemia and a prolonged hospitalization at time of bypass surgery with respiratory failure on mechanical ventilation.  She was last seen 03/16/2021.  More recently she had angiography and embolization of ACOM aneurysm [facility] interventional radiology  09/01/2021.  Compliance with diet, lifestyle and medications: Yes  She is now taking Brilinta after her interventional radiology procedure She remains fatigued she has had no further seizures she is not having edema shortness of breath chest pain palpitation or syncope. She tolerates her combined lipid-lowering therapy without muscle pain or weakness.  Recent labs 06/22/2019 cholesterol 168 LDL 91 triglycerides 94 HDL 60 A1c 5.8 hemoglobin 9.9 creatinine 0.88 Past Medical History:  Diagnosis Date   Acute on chronic respiratory failure with hypoxia (HCC)    Acute renal failure due to tubular necrosis [facility])    Atrial flutter (HCC)    Cardiogenic shock (HCC)    Coronary artery disease 08/13/2020   Coronary artery disease involving native coronary artery of native heart    Coronary artery disease involving native coronary artery of native heart without angina pectoris 09/17/2020   Depressed left ventricular ejection fraction 08/13/2020   Depression, unspecified 10/09/2015   Essential hypertension 10/06/2015   Gastro-esophageal reflux disease without esophagitis 10/06/2015   HFrEF (heart failure with reduced ejection fraction) (HCC) 08/13/2020   History of tonic-clonic seizures 04/25/2020   Hyperlipidemia 10/06/2015   Ischemic cardiomyopathy 08/13/2020   Mixed hyperlipidemia 09/17/2020   Pericardial effusion 08/13/2020   Post operative atrial fibrillation (HCC) 08/13/2020   Seizure disorder (HCC) 03/03/2016   Shortness of breath 04/25/2020   Syncope 04/25/2020   Tobacco use 08/13/2020    Past Surgical History:  Procedure Laterality Date   AORTIC VALVE REPLACEMENT (AVR)/CORONARY ARTERY BYPASS GRAFTING (CABG)  04/26/2020   X4   APPENDECTOMY  IR 3D INDEPENDENT WKST  07/05/2021   IR 3D INDEPENDENT WKST  08/31/2021   IR ANGIO INTRA EXTRACRAN SEL COM CAROTID INNOMINATE BILAT MOD SED  07/05/2021   IR ANGIO INTRA EXTRACRAN SEL INTERNAL CAROTID UNI R MOD SED  08/31/2021   IR ANGIO  VERTEBRAL SEL SUBCLAVIAN INNOMINATE BILAT MOD SED  07/05/2021   IR ANGIOGRAM FOLLOW UP STUDY  08/31/2021   IR RADIOLOGIST EVAL & MGMT  06/16/2021   IR RADIOLOGIST EVAL & MGMT  09/16/2021   IR TRANSCATH/EMBOLIZ  08/31/2021   RADIOLOGY WITH ANESTHESIA N/A 08/31/2021   Procedure: IR WITH ANESTHESIA EMBOLIZATION;  Surgeon: Julieanne Cottoneveshwar, Sanjeev, MD;  Location: [facility] OR;  Service: Radiology;  Laterality: N/A;   TONSILLECTOMY     TOTAL ABDOMINAL HYSTERECTOMY     TOTAL HIP ARTHROPLASTY      Current Medications: Current Meds  Medication Sig   acetaminophen (TYLENOL) 500 MG tablet Take 1,000 mg by mouth every 6 (six) hours as needed for moderate pain or mild pain.   albuterol (VENTOLIN HFA) 108 (90 Base) MCG/ACT inhaler Inhale 1 puff into the lungs every 6 (six) hours as needed for wheezing or shortness of breath.   aspirin EC 81 MG tablet Take 81 mg by mouth daily. [name] whole.   atorvastatin (LIPITOR) 40 MG tablet Take 1 tablet (40 mg total) by mouth daily. Call [phone] to make an appointment for further refills.   Cholecalciferol (VITAMIN D) 50 MCG (2000 UT) tablet Take 2,000 Units by mouth daily.   citalopram (CELEXA) 20 MG tablet Take 20 mg by mouth daily.   ezetimibe (ZETIA) 10 MG tablet Take 1 tablet (10 mg total) by mouth daily. Call [phone] to make an appointment for further refills.   ferrous sulfate 324 MG TBEC Take 324 mg by mouth daily with breakfast.   furosemide (LASIX) 40 MG tablet Take 20 mg by mouth every other day.   levETIRAcetam (KEPPRA XR) 500 MG 24 hr tablet Take 2 tablets in AM, 1 tablet in PM   meloxicam (MOBIC) 7.5 MG tablet Take 7.5 mg by mouth 2 (two) times daily as needed for pain.   metoprolol succinate (TOPROL-XL) 25 MG 24 hr tablet Take 0.5 tablets (12.5 mg total) by mouth daily.   Omega-3 Fatty Acids (FISH OIL) 1000 MG CAPS Take 1,000 mg by mouth daily.   pantoprazole (PROTONIX) 40 MG tablet Take 40 mg by mouth daily.   PREBIOTIC PRODUCT PO Take 1 tablet by  mouth daily.   ticagrelor (BRILINTA) 90 MG TABS tablet Take 45 mg by mouth 2 (two) times daily.   traMADol (ULTRAM) 50 MG tablet Take 50 mg by mouth 3 (three) times daily as needed for pain.   zolpidem (AMBIEN) 10 MG tablet Take 10 mg by mouth at bedtime.     Allergies:   Patient has no known allergies.   Social History   Socioeconomic History   Marital status: Married    Spouse name: Not on file   Number of children: Not on file   Years of education: Not on file   Highest education level: Not on file  Occupational History   Not on file  Tobacco Use   Smoking status: Former    Packs/day: 1.50    Types: Cigarettes    Quit date: 04/07/2020    Years since quitting: 1.4   Smokeless tobacco: Never  Vaping Use   Vaping Use: Never used  Substance and Sexual Activity   Alcohol use: Never   Drug use: Never  Sexual activity: Not on file  Other Topics Concern   Not on file  Social History Narrative   Right handed    Lives with sister    Social Determinants of Health   Financial Resource Strain: Not on file  Food Insecurity: Not on file  Transportation Needs: Not on file  Physical Activity: Not on file  Stress: Not on file  Social Connections: Not on file     Family History: The patient's family history includes Diabetes in her paternal grandmother; Diabetes Mellitus I in her sister; Hypertension in her brother, father, mother, and sister. ROS:   Please see the history of present illness.    All other systems reviewed and are negative.  EKGs/Labs/Other Studies Reviewed:    The following studies were reviewed today: EKG: None today   ZIO monitor Patch Wear Time:  9 days and 9 hours starting Dec 27, 2020. Indication: Dizziness   Patient had a minimum HR of 46 bpm, maximum HR of 98 bpm, and average HR of 60 bpm. Predominant underlying rhythm was Sinus Rhythm. Bundle Branch Block/IVCD was present.   Premature atrial complexes were rare. Premature ventricular complexes  were rare.   Symptoms are associated with sinus rhythm.   No ventricular tachycardia, no pauses, no supraventricular tachycardia noted.   Conclusion: Normal/unremarkable study with no significant arrhythmia.   Transthoracic  Echocardiogram IMPRESSIONS September 10, 2020  1. Left ventricular ejection fraction, by estimation, is 40 to 45%. The left ventricle has mildly decreased function. The left ventricle has no regional wall motion abnormalities. The left ventricular internal cavity size was mildly dilated. Left  ventricular diastolic parameters were normal.   2. Right ventricular systolic function is normal. The right ventricular size is normal. There is normal pulmonary artery systolic pressure.   3. The mitral valve is normal in structure. No evidence of mitral valve regurgitation. No evidence of mitral stenosis.   4. The aortic valve is normal in structure. Aortic valve regurgitation is mild. No aortic stenosis is present.   5. The inferior vena cava is normal in size with greater than 50% respiratory variability, suggesting right atrial pressure of 3 mmHg.   EKG:  EKG ordered today and personally reviewed.  The ekg ordered today demonstrates sinus rhythm right bundle branch block old inferior MI  Recent Labs: 12/08/2020: Magnesium 1.9 09/01/2021: BUN 8; Creatinine, Ser 0.88; Hemoglobin 9.9; Platelets 345; Potassium 3.7; Sodium 137  Recent Lipid Panel    Component Value Date/Time   CHOL 232 (H) 09/17/2020 1446   TRIG 161 (H) 09/17/2020 1446   HDL 46 09/17/2020 1446   CHOLHDL 5.0 (H) 09/17/2020 1446   LDLCALC 157 (H) 09/17/2020 1446    Physical Exam:    VS:  BP 130/80 (BP Location: Right Arm, Patient Position: Sitting, Cuff Size: Normal)    Ht 4\' 10"  (1.473 m)    Wt 170 lb (77.1 kg)    BMI 35.53 kg/m     Wt Readings from Last 3 Encounters:  10/05/21 170 lb (77.1 kg)  09/14/21 171 lb 12.8 oz (77.9 kg)  08/30/21 175 lb (79.4 kg)     GEN: Appears her age well nourished, well  developed in no acute distress HEENT: Normal NECK: No JVD; No carotid bruits LYMPHATICS: No lymphadenopathy CARDIAC: RRR, no murmurs, rubs, gallops RESPIRATORY:  Clear to auscultation without rales, wheezing or rhonchi  ABDOMEN: Soft, non-tender, non-distended MUSCULOSKELETAL:  No edema; No deformity  SKIN: Warm and dry NEUROLOGIC:  Alert and oriented x 3  PSYCHIATRIC:  Normal affect    Signed, Teresa Herrlich, MD  10/05/2021 5:05 PM    [facility]

## 2021-10-05 ENCOUNTER — Ambulatory Visit: Payer: 59 | Admitting: Cardiology

## 2021-10-05 ENCOUNTER — Other Ambulatory Visit: Payer: Self-pay

## 2021-10-05 ENCOUNTER — Encounter: Payer: Self-pay | Admitting: Cardiology

## 2021-10-05 VITALS — BP 130/80 | Ht <= 58 in | Wt 170.0 lb

## 2021-10-05 DIAGNOSIS — I5042 Chronic combined systolic (congestive) and diastolic (congestive) heart failure: Secondary | ICD-10-CM

## 2021-10-05 DIAGNOSIS — I110 Hypertensive heart disease with heart failure: Secondary | ICD-10-CM

## 2021-10-05 DIAGNOSIS — I2510 Atherosclerotic heart disease of native coronary artery without angina pectoris: Secondary | ICD-10-CM | POA: Diagnosis not present

## 2021-10-05 DIAGNOSIS — Z951 Presence of aortocoronary bypass graft: Secondary | ICD-10-CM | POA: Diagnosis not present

## 2021-10-05 DIAGNOSIS — E782 Mixed hyperlipidemia: Secondary | ICD-10-CM | POA: Diagnosis not present

## 2021-10-05 NOTE — Patient Instructions (Signed)
Medication Instructions:  ? ?Your physician recommends that you continue on your current medications as directed. Please refer to the Current Medication list given to you today. ? ? ?*If you need a refill on your cardiac medications before your next appointment, please call your pharmacy* ? ? ?Lab Work: NONE ORDERED  TODAY ? ? ? ?If you have labs (blood work) drawn today and your tests are completely normal, you will receive your results only by: ?MyChart Message (if you have MyChart) OR ?A paper copy in the mail ?If you have any lab test that is abnormal or we need to change your treatment, we will call you to review the results. ? ? ?Testing/Procedures: NONE ORDERED  TODAY ? ? ?Follow-Up: ?At [facility], you and your health needs are our priority.  As part of our continuing mission to provide you with exceptional heart care, we have created designated Provider Care Teams.  These Care Teams include your primary Cardiologist (physician) and Advanced Practice Providers (APPs -  Physician Assistants and Nurse Practitioners) who all work together to provide you with the care you need, when you need it. ? ?We recommend signing up for the patient portal called "MyChart".  Sign up information is provided on this After Visit Summary.  MyChart is used to connect with patients for Virtual Visits (Telemedicine).  Patients are able to view lab/test results, encounter notes, upcoming appointments, etc.  Non-urgent messages can be sent to your provider as well.   ?To learn more about what you can do with MyChart, go to https://[email or web address].com.   ? ?Your next appointment:   ?9 month(s) ? ?The format for your next appointment:   ?In Person ? ?Provider:   ?Brian Munley, MD{ ? ? ? ? ?Other Instructions ? ?

## 2021-10-12 DIAGNOSIS — M159 Polyosteoarthritis, unspecified: Secondary | ICD-10-CM | POA: Diagnosis not present

## 2021-10-12 DIAGNOSIS — Z6835 Body mass index (BMI) 35.0-35.9, adult: Secondary | ICD-10-CM | POA: Diagnosis not present

## 2021-10-12 DIAGNOSIS — J45909 Unspecified asthma, uncomplicated: Secondary | ICD-10-CM | POA: Diagnosis not present

## 2021-10-12 DIAGNOSIS — E7800 Pure hypercholesterolemia, unspecified: Secondary | ICD-10-CM | POA: Diagnosis not present

## 2021-10-12 DIAGNOSIS — G5692 Unspecified mononeuropathy of left upper limb: Secondary | ICD-10-CM | POA: Diagnosis not present

## 2021-10-12 DIAGNOSIS — I2510 Atherosclerotic heart disease of native coronary artery without angina pectoris: Secondary | ICD-10-CM | POA: Diagnosis not present

## 2021-10-12 DIAGNOSIS — D649 Anemia, unspecified: Secondary | ICD-10-CM | POA: Diagnosis not present

## 2021-10-12 DIAGNOSIS — I10 Essential (primary) hypertension: Secondary | ICD-10-CM | POA: Diagnosis not present

## 2021-10-12 DIAGNOSIS — N1831 Chronic kidney disease, stage 3a: Secondary | ICD-10-CM | POA: Diagnosis not present

## 2021-10-12 DIAGNOSIS — R69 Illness, unspecified: Secondary | ICD-10-CM | POA: Diagnosis not present

## 2021-11-09 DIAGNOSIS — F419 Anxiety disorder, unspecified: Secondary | ICD-10-CM | POA: Diagnosis not present

## 2021-11-09 DIAGNOSIS — G5692 Unspecified mononeuropathy of left upper limb: Secondary | ICD-10-CM | POA: Diagnosis not present

## 2021-11-09 DIAGNOSIS — R69 Illness, unspecified: Secondary | ICD-10-CM | POA: Diagnosis not present

## 2021-11-09 DIAGNOSIS — D649 Anemia, unspecified: Secondary | ICD-10-CM | POA: Diagnosis not present

## 2021-11-09 DIAGNOSIS — E7800 Pure hypercholesterolemia, unspecified: Secondary | ICD-10-CM | POA: Diagnosis not present

## 2021-11-09 DIAGNOSIS — I2510 Atherosclerotic heart disease of native coronary artery without angina pectoris: Secondary | ICD-10-CM | POA: Diagnosis not present

## 2021-11-09 DIAGNOSIS — G40909 Epilepsy, unspecified, not intractable, without status epilepticus: Secondary | ICD-10-CM | POA: Diagnosis not present

## 2021-11-09 DIAGNOSIS — J45909 Unspecified asthma, uncomplicated: Secondary | ICD-10-CM | POA: Diagnosis not present

## 2021-11-09 DIAGNOSIS — M25561 Pain in right knee: Secondary | ICD-10-CM | POA: Diagnosis not present

## 2021-11-09 DIAGNOSIS — N1831 Chronic kidney disease, stage 3a: Secondary | ICD-10-CM | POA: Diagnosis not present

## 2021-11-09 DIAGNOSIS — I10 Essential (primary) hypertension: Secondary | ICD-10-CM | POA: Diagnosis not present

## 2021-11-09 DIAGNOSIS — K219 Gastro-esophageal reflux disease without esophagitis: Secondary | ICD-10-CM | POA: Diagnosis not present

## 2021-11-11 ENCOUNTER — Ambulatory Visit: Payer: 59 | Admitting: Cardiology

## 2021-11-21 DIAGNOSIS — E7800 Pure hypercholesterolemia, unspecified: Secondary | ICD-10-CM | POA: Diagnosis not present

## 2021-11-21 DIAGNOSIS — I2510 Atherosclerotic heart disease of native coronary artery without angina pectoris: Secondary | ICD-10-CM | POA: Diagnosis not present

## 2021-11-21 DIAGNOSIS — Z6835 Body mass index (BMI) 35.0-35.9, adult: Secondary | ICD-10-CM | POA: Diagnosis not present

## 2021-11-21 DIAGNOSIS — I10 Essential (primary) hypertension: Secondary | ICD-10-CM | POA: Diagnosis not present

## 2021-11-21 DIAGNOSIS — M25561 Pain in right knee: Secondary | ICD-10-CM | POA: Diagnosis not present

## 2021-11-21 DIAGNOSIS — G5692 Unspecified mononeuropathy of left upper limb: Secondary | ICD-10-CM | POA: Diagnosis not present

## 2021-11-21 DIAGNOSIS — N1831 Chronic kidney disease, stage 3a: Secondary | ICD-10-CM | POA: Diagnosis not present

## 2021-11-21 DIAGNOSIS — R69 Illness, unspecified: Secondary | ICD-10-CM | POA: Diagnosis not present

## 2021-11-21 DIAGNOSIS — M159 Polyosteoarthritis, unspecified: Secondary | ICD-10-CM | POA: Diagnosis not present

## 2021-11-21 DIAGNOSIS — D649 Anemia, unspecified: Secondary | ICD-10-CM | POA: Diagnosis not present

## 2022-01-04 DIAGNOSIS — G40909 Epilepsy, unspecified, not intractable, without status epilepticus: Secondary | ICD-10-CM | POA: Diagnosis not present

## 2022-01-04 DIAGNOSIS — D649 Anemia, unspecified: Secondary | ICD-10-CM | POA: Diagnosis not present

## 2022-01-04 DIAGNOSIS — K219 Gastro-esophageal reflux disease without esophagitis: Secondary | ICD-10-CM | POA: Diagnosis not present

## 2022-01-04 DIAGNOSIS — G5692 Unspecified mononeuropathy of left upper limb: Secondary | ICD-10-CM | POA: Diagnosis not present

## 2022-01-04 DIAGNOSIS — E7800 Pure hypercholesterolemia, unspecified: Secondary | ICD-10-CM | POA: Diagnosis not present

## 2022-01-04 DIAGNOSIS — J45909 Unspecified asthma, uncomplicated: Secondary | ICD-10-CM | POA: Diagnosis not present

## 2022-01-04 DIAGNOSIS — I2510 Atherosclerotic heart disease of native coronary artery without angina pectoris: Secondary | ICD-10-CM | POA: Diagnosis not present

## 2022-01-04 DIAGNOSIS — N1831 Chronic kidney disease, stage 3a: Secondary | ICD-10-CM | POA: Diagnosis not present

## 2022-01-04 DIAGNOSIS — I10 Essential (primary) hypertension: Secondary | ICD-10-CM | POA: Diagnosis not present

## 2022-01-04 DIAGNOSIS — M25561 Pain in right knee: Secondary | ICD-10-CM | POA: Diagnosis not present

## 2022-01-04 DIAGNOSIS — M159 Polyosteoarthritis, unspecified: Secondary | ICD-10-CM | POA: Diagnosis not present

## 2022-01-04 DIAGNOSIS — R69 Illness, unspecified: Secondary | ICD-10-CM | POA: Diagnosis not present

## 2022-02-01 DIAGNOSIS — I10 Essential (primary) hypertension: Secondary | ICD-10-CM | POA: Diagnosis not present

## 2022-02-01 DIAGNOSIS — M159 Polyosteoarthritis, unspecified: Secondary | ICD-10-CM | POA: Diagnosis not present

## 2022-02-01 DIAGNOSIS — R69 Illness, unspecified: Secondary | ICD-10-CM | POA: Diagnosis not present

## 2022-02-01 DIAGNOSIS — I2510 Atherosclerotic heart disease of native coronary artery without angina pectoris: Secondary | ICD-10-CM | POA: Diagnosis not present

## 2022-02-01 DIAGNOSIS — K219 Gastro-esophageal reflux disease without esophagitis: Secondary | ICD-10-CM | POA: Diagnosis not present

## 2022-02-01 DIAGNOSIS — Z6835 Body mass index (BMI) 35.0-35.9, adult: Secondary | ICD-10-CM | POA: Diagnosis not present

## 2022-02-01 DIAGNOSIS — E7800 Pure hypercholesterolemia, unspecified: Secondary | ICD-10-CM | POA: Diagnosis not present

## 2022-02-01 DIAGNOSIS — G5692 Unspecified mononeuropathy of left upper limb: Secondary | ICD-10-CM | POA: Diagnosis not present

## 2022-02-01 DIAGNOSIS — J45909 Unspecified asthma, uncomplicated: Secondary | ICD-10-CM | POA: Diagnosis not present

## 2022-02-01 DIAGNOSIS — N1831 Chronic kidney disease, stage 3a: Secondary | ICD-10-CM | POA: Diagnosis not present

## 2022-02-01 DIAGNOSIS — D649 Anemia, unspecified: Secondary | ICD-10-CM | POA: Diagnosis not present

## 2022-02-21 DIAGNOSIS — K219 Gastro-esophageal reflux disease without esophagitis: Secondary | ICD-10-CM | POA: Diagnosis not present

## 2022-02-21 DIAGNOSIS — N1831 Chronic kidney disease, stage 3a: Secondary | ICD-10-CM | POA: Diagnosis not present

## 2022-02-21 DIAGNOSIS — I2510 Atherosclerotic heart disease of native coronary artery without angina pectoris: Secondary | ICD-10-CM | POA: Diagnosis not present

## 2022-02-21 DIAGNOSIS — E7800 Pure hypercholesterolemia, unspecified: Secondary | ICD-10-CM | POA: Diagnosis not present

## 2022-02-21 DIAGNOSIS — F419 Anxiety disorder, unspecified: Secondary | ICD-10-CM | POA: Diagnosis not present

## 2022-02-21 DIAGNOSIS — R69 Illness, unspecified: Secondary | ICD-10-CM | POA: Diagnosis not present

## 2022-02-21 DIAGNOSIS — G5692 Unspecified mononeuropathy of left upper limb: Secondary | ICD-10-CM | POA: Diagnosis not present

## 2022-02-21 DIAGNOSIS — I10 Essential (primary) hypertension: Secondary | ICD-10-CM | POA: Diagnosis not present

## 2022-02-21 DIAGNOSIS — M159 Polyosteoarthritis, unspecified: Secondary | ICD-10-CM | POA: Diagnosis not present

## 2022-02-21 DIAGNOSIS — J45909 Unspecified asthma, uncomplicated: Secondary | ICD-10-CM | POA: Diagnosis not present

## 2022-02-21 DIAGNOSIS — D649 Anemia, unspecified: Secondary | ICD-10-CM | POA: Diagnosis not present

## 2022-02-21 DIAGNOSIS — G40909 Epilepsy, unspecified, not intractable, without status epilepticus: Secondary | ICD-10-CM | POA: Diagnosis not present

## 2022-03-22 DIAGNOSIS — R69 Illness, unspecified: Secondary | ICD-10-CM | POA: Diagnosis not present

## 2022-03-22 DIAGNOSIS — D649 Anemia, unspecified: Secondary | ICD-10-CM | POA: Diagnosis not present

## 2022-03-22 DIAGNOSIS — M159 Polyosteoarthritis, unspecified: Secondary | ICD-10-CM | POA: Diagnosis not present

## 2022-03-22 DIAGNOSIS — J45909 Unspecified asthma, uncomplicated: Secondary | ICD-10-CM | POA: Diagnosis not present

## 2022-03-22 DIAGNOSIS — I2510 Atherosclerotic heart disease of native coronary artery without angina pectoris: Secondary | ICD-10-CM | POA: Diagnosis not present

## 2022-03-22 DIAGNOSIS — E7800 Pure hypercholesterolemia, unspecified: Secondary | ICD-10-CM | POA: Diagnosis not present

## 2022-03-22 DIAGNOSIS — R42 Dizziness and giddiness: Secondary | ICD-10-CM | POA: Diagnosis not present

## 2022-03-22 DIAGNOSIS — I10 Essential (primary) hypertension: Secondary | ICD-10-CM | POA: Diagnosis not present

## 2022-03-22 DIAGNOSIS — F3341 Major depressive disorder, recurrent, in partial remission: Secondary | ICD-10-CM | POA: Diagnosis not present

## 2022-03-22 DIAGNOSIS — R5382 Chronic fatigue, unspecified: Secondary | ICD-10-CM | POA: Diagnosis not present

## 2022-03-22 DIAGNOSIS — K219 Gastro-esophageal reflux disease without esophagitis: Secondary | ICD-10-CM | POA: Diagnosis not present

## 2022-03-24 ENCOUNTER — Other Ambulatory Visit: Payer: Self-pay | Admitting: Cardiology

## 2022-04-05 DIAGNOSIS — K219 Gastro-esophageal reflux disease without esophagitis: Secondary | ICD-10-CM | POA: Diagnosis not present

## 2022-04-05 DIAGNOSIS — M25562 Pain in left knee: Secondary | ICD-10-CM | POA: Diagnosis not present

## 2022-04-05 DIAGNOSIS — R69 Illness, unspecified: Secondary | ICD-10-CM | POA: Diagnosis not present

## 2022-04-05 DIAGNOSIS — N1831 Chronic kidney disease, stage 3a: Secondary | ICD-10-CM | POA: Diagnosis not present

## 2022-04-05 DIAGNOSIS — M159 Polyosteoarthritis, unspecified: Secondary | ICD-10-CM | POA: Diagnosis not present

## 2022-04-05 DIAGNOSIS — R739 Hyperglycemia, unspecified: Secondary | ICD-10-CM | POA: Diagnosis not present

## 2022-04-05 DIAGNOSIS — E7800 Pure hypercholesterolemia, unspecified: Secondary | ICD-10-CM | POA: Diagnosis not present

## 2022-04-05 DIAGNOSIS — I10 Essential (primary) hypertension: Secondary | ICD-10-CM | POA: Diagnosis not present

## 2022-04-05 DIAGNOSIS — D649 Anemia, unspecified: Secondary | ICD-10-CM | POA: Diagnosis not present

## 2022-04-05 DIAGNOSIS — I2510 Atherosclerotic heart disease of native coronary artery without angina pectoris: Secondary | ICD-10-CM | POA: Diagnosis not present

## 2022-04-05 DIAGNOSIS — F419 Anxiety disorder, unspecified: Secondary | ICD-10-CM | POA: Diagnosis not present

## 2022-04-05 DIAGNOSIS — R42 Dizziness and giddiness: Secondary | ICD-10-CM | POA: Diagnosis not present

## 2022-04-19 DIAGNOSIS — R7303 Prediabetes: Secondary | ICD-10-CM | POA: Diagnosis not present

## 2022-04-19 DIAGNOSIS — R69 Illness, unspecified: Secondary | ICD-10-CM | POA: Diagnosis not present

## 2022-04-19 DIAGNOSIS — K219 Gastro-esophageal reflux disease without esophagitis: Secondary | ICD-10-CM | POA: Diagnosis not present

## 2022-04-19 DIAGNOSIS — M159 Polyosteoarthritis, unspecified: Secondary | ICD-10-CM | POA: Diagnosis not present

## 2022-04-19 DIAGNOSIS — E7800 Pure hypercholesterolemia, unspecified: Secondary | ICD-10-CM | POA: Diagnosis not present

## 2022-04-19 DIAGNOSIS — F3341 Major depressive disorder, recurrent, in partial remission: Secondary | ICD-10-CM | POA: Diagnosis not present

## 2022-04-19 DIAGNOSIS — I2510 Atherosclerotic heart disease of native coronary artery without angina pectoris: Secondary | ICD-10-CM | POA: Diagnosis not present

## 2022-04-19 DIAGNOSIS — N1831 Chronic kidney disease, stage 3a: Secondary | ICD-10-CM | POA: Diagnosis not present

## 2022-04-19 DIAGNOSIS — I10 Essential (primary) hypertension: Secondary | ICD-10-CM | POA: Diagnosis not present

## 2022-04-19 DIAGNOSIS — D649 Anemia, unspecified: Secondary | ICD-10-CM | POA: Diagnosis not present

## 2022-04-19 DIAGNOSIS — R42 Dizziness and giddiness: Secondary | ICD-10-CM | POA: Diagnosis not present

## 2022-05-01 ENCOUNTER — Telehealth: Payer: Self-pay | Admitting: Cardiology

## 2022-05-01 ENCOUNTER — Other Ambulatory Visit: Payer: Self-pay

## 2022-05-01 MED ORDER — METOPROLOL SUCCINATE ER 25 MG PO TB24: 12.5000 mg | ORAL_TABLET | Freq: Every day | ORAL | 1 refills | Status: DC

## 2022-05-01 NOTE — Telephone Encounter (Signed)
Refilled Metoprolol Succinate 25 q d #90 - [facility] [place]

## 2022-05-01 NOTE — Telephone Encounter (Signed)
 *  STAT* If patient is at the pharmacy, call can be transferred to refill team.   1. Which medications need to be refilled? (please list name of each medication and dose if known)   metoprolol succinate (TOPROL-XL) 25 MG 24 hr tablet  2. Which pharmacy/location (including street and city if local pharmacy) is medication to be sent to?  [id number] - [place], [place] - [address]  3. Do they need a 30 day or 90 day supply? 90 day

## 2022-05-02 ENCOUNTER — Telehealth (HOSPITAL_COMMUNITY): Payer: Self-pay

## 2022-05-02 ENCOUNTER — Other Ambulatory Visit (HOSPITAL_COMMUNITY): Payer: Self-pay | Admitting: Interventional Radiology

## 2022-05-02 DIAGNOSIS — I671 Cerebral aneurysm, nonruptured: Secondary | ICD-10-CM

## 2022-05-02 NOTE — Telephone Encounter (Signed)
Called to schedule mra, no answer, vm full. AW  

## 2022-05-16 ENCOUNTER — Ambulatory Visit: Payer: 59 | Admitting: Neurology

## 2022-05-16 ENCOUNTER — Encounter: Payer: Self-pay | Admitting: Neurology

## 2022-05-16 VITALS — BP 125/83 | HR 90 | Ht <= 58 in | Wt 177.0 lb

## 2022-05-16 DIAGNOSIS — R29898 Other symptoms and signs involving the musculoskeletal system: Secondary | ICD-10-CM

## 2022-05-16 DIAGNOSIS — G40009 Localization-related (focal) (partial) idiopathic epilepsy and epileptic syndromes with seizures of localized onset, not intractable, without status epilepticus: Secondary | ICD-10-CM | POA: Diagnosis not present

## 2022-05-16 MED ORDER — LEVETIRACETAM ER 500 MG PO TB24: ORAL_TABLET | ORAL | 11 refills | Status: DC
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
## 2022-05-16 NOTE — Patient Instructions (Signed)
Good to see you.  Schedule EMG/NCV of the left upper extremity  2. Continue Keppra XR 500mg : take 2 tablets in AM, 1 tablet in PM  3. Follow-up in 6 months, call for any changes   Seizure Precautions: 1. If medication has been prescribed for you to prevent seizures, take it exactly as directed.  Do not stop taking the medicine without talking to your doctor first, even if you have not had a seizure in a long time.   2. Avoid activities in which a seizure would cause danger to yourself or to others.  Don't operate dangerous machinery, swim alone, or climb in high or dangerous places, such as on ladders, roofs, or girders.  Do not drive unless your doctor says you may.  3. If you have any warning that you may have a seizure, lay down in a safe place where you can't hurt yourself.    4.  No driving for 6 months from last seizure, as per [facility].   Please refer to the following link on the [facility] website for more information: http://www.epilepsyfoundation.org/answerplace/Social/driving/drivingu.cfm   5.  Maintain good sleep hygiene. Avoid alcohol.  6.  Contact your doctor if you have any problems that may be related to the medicine you are taking.  7.  Call 911 and bring the patient back to the ED if:        A.  The seizure lasts longer than 5 minutes.       B.  The patient doesn't awaken shortly after the seizure  C.  The patient has new problems such as difficulty seeing, speaking or moving  D.  The patient was injured during the seizure  E.  The patient has a temperature over 102 F (39C)  F.  The patient vomited and now is having trouble breathing

## 2022-06-06 ENCOUNTER — Ambulatory Visit: Payer: 59 | Admitting: Neurology

## 2022-06-06 DIAGNOSIS — M5412 Radiculopathy, cervical region: Secondary | ICD-10-CM

## 2022-06-06 DIAGNOSIS — R29898 Other symptoms and signs involving the musculoskeletal system: Secondary | ICD-10-CM | POA: Diagnosis not present

## 2022-06-06 DIAGNOSIS — G5602 Carpal tunnel syndrome, left upper limb: Secondary | ICD-10-CM

## 2022-06-06 NOTE — Procedures (Signed)
  [facility]  [place], [place]  [place], [place] [address] Tel: [phone] Fax: [phone] Test Date:  06/06/2022  Patient: Teresa [name] DOB: 06-Dec-1960 Physician: Narda Amber, DO  Sex: Female Height: 4\' 10"  Ref Phys: Ellouise Newer, MD  ID#: [id number]   Technician:    History: This is a 61 year old female referred for evaluation of left hand paresthesias and weakness.  NCV & EMG Findings: Extensive electrodiagnostic testing of the left upper extremity shows:  Left median sensory response shows prolonged latency (4.0 ms).  Left ulnar sensory response is within normal limits.   Left median and ulnar motor responses are within normal limits.   Chronic motor axonal loss changes are seen affecting the C5 myotome on the left, without accompanied active denervation.    Impression: Left median neuropathy at or distal to the wrist, consistent with a clinical diagnosis of carpal tunnel syndrome.  Overall, these findings are mild in degree electrically. Chronic C5 radiculopathy affecting the left upper extremity, mild.   ___________________________ Narda Amber, DO    Nerve Conduction Studies   Stim Site NR Peak (ms) Norm Peak (ms) O-P Amp (V) Norm O-P Amp  Left Median Anti Sensory (2nd Digit)  32 C  Wrist    *4.0 <3.8 13.8 >10  Left Ulnar Anti Sensory (5th Digit)  32 C  Wrist    2.9 <3.2 19.8 >5     Stim Site NR Onset (ms) Norm Onset (ms) O-P Amp (mV) Norm O-P Amp Site1 Site2 Delta-0 (ms) Dist (cm) Vel (m/s) Norm Vel (m/s)  Left Median Motor (Abd Poll Brev)  32 C  Wrist    3.1 <4.0 7.4 >5 Elbow Wrist 4.8 25.0 52 >50  Elbow    7.9  6.7         Left Ulnar Motor (Abd Dig Minimi)  32 C  Wrist    2.3 <3.1 7.6 >7 B Elbow Wrist 3.1 19.0 61 >50  B Elbow    5.4  7.6  A Elbow B Elbow 1.2 8.0 67 >50  A Elbow    6.6  7.2          Electromyography   Side Muscle Ins.Act Fibs Fasc Recrt Amp Dur Poly Activation Comment  Left 1stDorInt Nml Nml Nml Nml Nml Nml  Nml Nml N/A  Left Abd Poll Brev Nml Nml Nml Nml Nml Nml Nml Nml N/A  Left PronatorTeres Nml Nml Nml Nml Nml Nml Nml Nml N/A  Left Biceps Nml Nml Nml Nml Nml Nml Nml Nml N/A  Left Triceps Nml Nml Nml Nml Nml Nml Nml Nml N/A  Left Deltoid Nml Nml Nml *1- *1+ *1+ *1+ Nml N/A  Left Infraspinatus Nml Nml Nml *1- *1+ *1+ *1+ Nml N/A      Waveforms:

## 2022-06-08 ENCOUNTER — Other Ambulatory Visit: Payer: Self-pay

## 2022-06-08 ENCOUNTER — Telehealth: Payer: Self-pay | Admitting: Neurology

## 2022-06-08 DIAGNOSIS — R29898 Other symptoms and signs involving the musculoskeletal system: Secondary | ICD-10-CM

## 2022-06-08 DIAGNOSIS — G5602 Carpal tunnel syndrome, left upper limb: Secondary | ICD-10-CM

## 2022-06-08 DIAGNOSIS — G40009 Localization-related (focal) (partial) idiopathic epilepsy and epileptic syndromes with seizures of localized onset, not intractable, without status epilepticus: Secondary | ICD-10-CM

## 2022-06-08 DIAGNOSIS — I729 Aneurysm of unspecified site: Secondary | ICD-10-CM

## 2022-06-08 DIAGNOSIS — M5412 Radiculopathy, cervical region: Secondary | ICD-10-CM

## 2022-06-08 NOTE — Telephone Encounter (Signed)
done

## 2022-06-08 NOTE — Telephone Encounter (Signed)
Pt called in and left a message. She is returning a call to our office. 

## 2022-06-08 NOTE — Telephone Encounter (Signed)
Called and talked to pt and informed her of results per Dr. Delice Lesch. She understood and was okay to order the MRI. Order made.

## 2022-06-21 ENCOUNTER — Ambulatory Visit
Admission: RE | Admit: 2022-06-21 | Discharge: 2022-06-21 | Disposition: A | Discharge status: Home / Self Care | Payer: 59 | Source: Ambulatory Visit | Attending: Neurology | Admitting: Neurology

## 2022-06-21 DIAGNOSIS — G40009 Localization-related (focal) (partial) idiopathic epilepsy and epileptic syndromes with seizures of localized onset, not intractable, without status epilepticus: Secondary | ICD-10-CM

## 2022-06-21 DIAGNOSIS — M5412 Radiculopathy, cervical region: Secondary | ICD-10-CM

## 2022-06-21 DIAGNOSIS — G5602 Carpal tunnel syndrome, left upper limb: Secondary | ICD-10-CM

## 2022-06-21 DIAGNOSIS — I729 Aneurysm of unspecified site: Secondary | ICD-10-CM

## 2022-06-21 DIAGNOSIS — R29898 Other symptoms and signs involving the musculoskeletal system: Secondary | ICD-10-CM

## 2022-07-03 ENCOUNTER — Ambulatory Visit (HOSPITAL_COMMUNITY)
Admission: RE | Admit: 2022-07-03 | Discharge: 2022-07-03 | Disposition: A | Discharge status: Home / Self Care | Payer: 59 | Source: Ambulatory Visit | Attending: Interventional Radiology | Admitting: Interventional Radiology

## 2022-07-03 DIAGNOSIS — I671 Cerebral aneurysm, nonruptured: Secondary | ICD-10-CM | POA: Insufficient documentation

## 2022-07-04 ENCOUNTER — Telehealth: Payer: Self-pay | Admitting: Neurology

## 2022-07-04 NOTE — Telephone Encounter (Signed)
Patient wants to get the results from the MRI  please call

## 2022-07-04 NOTE — Telephone Encounter (Signed)
Pls let her know the cervical MRI looked fine, there were arthritis changes seen but nothing that was significantly pressing on the nerves that she would need to see a surgeon. If she continues to have the hand weakness and has pain, we can still refer to Ortho if she wishes.

## 2022-07-04 NOTE — Telephone Encounter (Signed)
Pt called informed that cervical MRI looked fine, there were arthritis changes seen but nothing that was significantly pressing on the nerves that she would need to see a surgeon. If she continues to have the hand weakness and has pain, we can still refer to Ortho if she wishes. Pt stated that she will thank about it an call us back

## 2022-07-07 ENCOUNTER — Telehealth (HOSPITAL_COMMUNITY): Payer: Self-pay

## 2022-07-07 NOTE — Telephone Encounter (Signed)
Pt agreed to f/u in 6 months with a mra. AW

## 2022-07-09 NOTE — Progress Notes (Unsigned)
Cardiology Office Note:    Date:  07/10/2022   ID:  [name], DOB 1961-03-03, MRN [id number]  PCP:  Simone Curia, MD  Cardiologist:  Norman Herrlich, MD    Referring MD: Simone Curia, MD    ASSESSMENT:    1. Coronary artery disease involving native coronary artery of native heart without angina pectoris   2. Hx of CABG   3. Ischemic cardiomyopathy   4. Hypertensive heart disease with chronic combined systolic and diastolic congestive heart failure (HCC)   5. Mixed hyperlipidemia    PLAN:    In order of problems listed above:  Overall she has done well recovering from CABG and severe LV dysfunction she is experiencing nonanginal chest pain appears to be musculoskeletal and related to sternotomy I have told her she can take Aleve over-the-counter daily if not taking Mobic and will do an EKG in the office today for assurance.  Although her medication list says she is taking Brilinta that she is not.  Continue aspirin beta-blocker lipid-lowering therapy with her high intensity statin. Recheck echocardiogram with her post CABG cardiomyopathy Stable hypertension blood pressure well-controlled and heart failure compensated continue her current minimum dose diuretic And your statin follow-up labs next week PCP   Next appointment: 1 year   Medication Adjustments/Labs and Tests Ordered: Current medicines are reviewed at length with the patient today.  Concerns regarding medicines are outlined above.  No orders of the defined types were placed in this encounter.  No orders of the defined types were placed in this encounter.   Chief Complaint  Patient presents with   Follow-up   Coronary Artery Disease    History of Present Illness:    Teresa [name] is a 61 y.o. female with a hx of CAD with CABG April 25, 2020 cardiomyopathy EF initially 25 to 30% most recent in February 2022 to 40 to 45% hypertensive heart disease hyperlipidemia and a prolonged hospitalization at time of bypass  surgery with respiratory failure on mechanical ventilation. last seen 10/05/2021.  She underwent angiography and embolization of intracranial aneurysm [facility] 09/01/2021 and follows with neurology for seizure disorder.  Compliance with diet, lifestyle and medications: Yes  Overall she is struggling she just had her MRI performed with persistent weakness and pain in the left hand. She has had falls and she had 1 this morning come to the office and has an ecchymosis on her left calf. She has occasional chest pain mostly at night nonexertional sharp in nature and associated chest wall tenderness at the site of CABG She is not having angina edema orthopnea palpitation or syncope Seeing her PCP next week will have labs rechecked She tolerates her lipid-lowering therapy and her last lipid profile 10/12/2021 cholesterol 140 LDL 79 Past Medical History:  Diagnosis Date   Acute on chronic respiratory failure with hypoxia (HCC)    Acute renal failure due to tubular necrosis [facility])    Atrial flutter (HCC)    Cardiogenic shock (HCC)    Coronary artery disease 08/13/2020   Coronary artery disease involving native coronary artery of native heart    Coronary artery disease involving native coronary artery of native heart without angina pectoris 09/17/2020   Depressed left ventricular ejection fraction 08/13/2020   Depression, unspecified 10/09/2015   Essential hypertension 10/06/2015   Gastro-esophageal reflux disease without esophagitis 10/06/2015   HFrEF (heart failure with reduced ejection fraction) (HCC) 08/13/2020   History of tonic-clonic seizures 04/25/2020   Hyperlipidemia 10/06/2015  Ischemic cardiomyopathy 08/13/2020   Mixed hyperlipidemia 09/17/2020   Pericardial effusion 08/13/2020   Post operative atrial fibrillation (HCC) 08/13/2020   Seizure disorder (HCC) 03/03/2016   Shortness of breath 04/25/2020   Syncope 04/25/2020   Tobacco use 08/13/2020    Past Surgical  History:  Procedure Laterality Date   AORTIC VALVE REPLACEMENT (AVR)/CORONARY ARTERY BYPASS GRAFTING (CABG)  04/26/2020   X4   APPENDECTOMY     IR 3D INDEPENDENT [facility]KST  07/05/2021   IR 3D INDEPENDENT [facility]KST  08/31/2021   IR ANGIO INTRA EXTRACRAN SEL COM CAROTID INNOMINATE BILAT MOD SED  07/05/2021   IR ANGIO INTRA EXTRACRAN SEL INTERNAL CAROTID UNI R MOD SED  08/31/2021   IR ANGIO VERTEBRAL SEL SUBCLAVIAN INNOMINATE BILAT MOD SED  07/05/2021   IR ANGIOGRAM FOLLOW UP STUDY  08/31/2021   IR RADIOLOGIST EVAL & MGMT  06/16/2021   IR RADIOLOGIST EVAL & MGMT  09/16/2021   IR TRANSCATH/EMBOLIZ  08/31/2021   RADIOLOGY WITH ANESTHESIA N/A 08/31/2021   Procedure: IR WITH ANESTHESIA EMBOLIZATION;  Surgeon: Julieanne Cotton, MD;  Location: [facility] OR;  Service: Radiology;  Laterality: N/A;   TONSILLECTOMY     TOTAL ABDOMINAL HYSTERECTOMY     TOTAL HIP ARTHROPLASTY      Current Medications: Current Meds  Medication Sig   acetaminophen (TYLENOL) 500 MG tablet Take 1,000 mg by mouth every 6 (six) hours as needed for moderate pain or mild pain.   albuterol (VENTOLIN HFA) 108 (90 Base) MCG/ACT inhaler Inhale 1 puff into the lungs every 6 (six) hours as needed for wheezing or shortness of breath.   aspirin EC 81 MG tablet Take 81 mg by mouth daily. Swallow whole.   atorvastatin (LIPITOR) 40 MG tablet Take 1 tablet (40 mg total) by mouth daily. Call [phone] to make an appointment for further refills.   Cholecalciferol (VITAMIN D) 50 MCG (2000 UT) tablet Take 2,000 Units by mouth daily.   citalopram (CELEXA) 20 MG tablet Take 20 mg by mouth daily.   ezetimibe (ZETIA) 10 MG tablet Take 1 tablet (10 mg total) by mouth daily.   ferrous sulfate 324 MG TBEC Take 324 mg by mouth daily with breakfast.   furosemide (LASIX) 40 MG tablet Take 20 mg by mouth every other day.   levETIRAcetam (KEPPRA XR) 500 MG 24 hr tablet Take 2 tablets in AM, 1 tablet in PM   meloxicam (MOBIC) 7.5 MG tablet Take 7.5 mg by mouth 2 (two)  times daily as needed for pain.   metoprolol succinate (TOPROL-XL) 25 MG 24 hr tablet Take 0.5 tablets (12.5 mg total) by mouth daily.   Omega-3 Fatty Acids (FISH OIL) 1000 MG CAPS Take 1,000 mg by mouth daily.   pantoprazole (PROTONIX) 40 MG tablet Take 40 mg by mouth daily.   PREBIOTIC PRODUCT PO Take 1 tablet by mouth daily.   ticagrelor (BRILINTA) 90 MG TABS tablet Take 45 mg by mouth 2 (two) times daily.   traMADol (ULTRAM) 50 MG tablet Take 50 mg by mouth 3 (three) times daily as needed for pain.   zolpidem (AMBIEN) 10 MG tablet Take 10 mg by mouth at bedtime.     Allergies:   Patient has no known allergies.   Social History   Socioeconomic History   Marital status: Married    Spouse name: Not on file   Number of children: Not on file   Years of education: Not on file   Highest education level: Not on file  Occupational History  Not on file  Tobacco Use   Smoking status: Former    Packs/day: 1.50    Types: Cigarettes    Quit date: 04/07/2020    Years since quitting: 2.2   Smokeless tobacco: Never  Vaping Use   Vaping Use: Never used  Substance and Sexual Activity   Alcohol use: Never   Drug use: Never   Sexual activity: Not on file  Other Topics Concern   Not on file  Social History Narrative   Right handed    Lives with sister    Social Determinants of Health   Financial Resource Strain: Not on file  Food Insecurity: Not on file  Transportation Needs: Not on file  Physical Activity: Not on file  Stress: Not on file  Social Connections: Not on file     Family History: The patient's family history includes Diabetes in her paternal grandmother; Diabetes Mellitus I in her sister; Hypertension in her brother, father, mother, and sister. ROS:   Please see the history of present illness.    All other systems reviewed and are negative.  EKGs/Labs/Other Studies Reviewed:    The following studies were reviewed today:  Recent Labs: 09/01/2021: BUN 8;  Creatinine, Ser 0.88; Hemoglobin 9.9; Platelets 345; Potassium 3.7; Sodium 137  Recent Lipid Panel    Component Value Date/Time   CHOL 232 (H) 09/17/2020 1446   TRIG 161 (H) 09/17/2020 1446   HDL 46 09/17/2020 1446   CHOLHDL 5.0 (H) 09/17/2020 1446   LDLCALC 157 (H) 09/17/2020 1446    Physical Exam:    VS:  BP 126/82 (BP Location: Right Arm, Patient Position: Sitting, Cuff Size: Large)   Pulse 98   Ht 4\' 11"  (1.499 m)   Wt 184 lb (83.5 kg)   SpO2 96%   BMI 37.16 kg/m     Wt Readings from Last 3 Encounters:  07/10/22 184 lb (83.5 kg)  05/16/22 177 lb (80.3 kg)  10/05/21 170 lb (77.1 kg)     GEN:  Well nourished, well developed in no acute distress HEENT: Normal NECK: No JVD; No carotid bruits LYMPHATICS: No lymphadenopathy CARDIAC: RRR, no murmurs, rubs, gallops marked tenderness sternum to palpation of the left CC J RESPIRATORY:  Clear to auscultation without rales, wheezing or rhonchi  ABDOMEN: Soft, non-tender, non-distended MUSCULOSKELETAL:  No edema; No deformity  SKIN: Warm and dry NEUROLOGIC:  Alert and oriented x 3 PSYCHIATRIC:  Normal affect    Signed, 12/05/21, MD  07/10/2022 11:16 AM    [facility]

## 2022-07-10 ENCOUNTER — Ambulatory Visit: Payer: 59 | Attending: Cardiology | Admitting: Cardiology

## 2022-07-10 ENCOUNTER — Encounter: Payer: Self-pay | Admitting: Cardiology

## 2022-07-10 VITALS — BP 126/82 | HR 98 | Ht 59.0 in | Wt 184.0 lb

## 2022-07-10 DIAGNOSIS — Z951 Presence of aortocoronary bypass graft: Secondary | ICD-10-CM

## 2022-07-10 DIAGNOSIS — I255 Ischemic cardiomyopathy: Secondary | ICD-10-CM

## 2022-07-10 DIAGNOSIS — I5042 Chronic combined systolic (congestive) and diastolic (congestive) heart failure: Secondary | ICD-10-CM

## 2022-07-10 DIAGNOSIS — I2510 Atherosclerotic heart disease of native coronary artery without angina pectoris: Secondary | ICD-10-CM | POA: Diagnosis not present

## 2022-07-10 DIAGNOSIS — E782 Mixed hyperlipidemia: Secondary | ICD-10-CM

## 2022-07-10 DIAGNOSIS — I110 Hypertensive heart disease with heart failure: Secondary | ICD-10-CM | POA: Diagnosis not present

## 2022-07-10 MED ORDER — NAPROXEN SODIUM 220 MG PO TABS: 220.0000 mg | ORAL_TABLET | Freq: Every day | ORAL | 3 refills | Status: DC

## 2022-07-10 NOTE — Patient Instructions (Signed)
Medication Instructions:  Your physician has recommended you make the following change in your medication:   START: Aleve 220 mg (1 tablet) daily at midday  *If you need a refill on your cardiac medications before your next appointment, please call your pharmacy*   Lab Work: None If you have labs (blood work) drawn today and your tests are completely normal, you will receive your results only by: MyChart Message (if you have MyChart) OR A paper copy in the mail If you have any lab test that is abnormal or we need to change your treatment, we will call you to review the results.   Testing/Procedures: Your physician has requested that you have an echocardiogram. Echocardiography is a painless test that uses sound waves to create images of your heart. It provides your doctor with information about the size and shape of your heart and how well your heart's chambers and valves are working. This procedure takes approximately one hour. There are no restrictions for this procedure. Please do NOT wear cologne, perfume, aftershave, or lotions (deodorant is allowed). Please arrive 15 minutes prior to your appointment time.    Follow-Up: At [facility], you and your health needs are our priority.  As part of our continuing mission to provide you with exceptional heart care, we have created designated Provider Care Teams.  These Care Teams include your primary Cardiologist (physician) and Advanced Practice Providers (APPs -  Physician Assistants and Nurse Practitioners) who all work together to provide you with the care you need, when you need it.  We recommend signing up for the patient portal called "MyChart".  Sign up information is provided on this After Visit Summary.  MyChart is used to connect with patients for Virtual Visits (Telemedicine).  Patients are able to view lab/test results, encounter notes, upcoming appointments, etc.  Non-urgent messages can be sent to your provider as  well.   To learn more about what you can do with MyChart, go to [email or web address].    Your next appointment:   1 year(s)  The format for your next appointment:   In Person  Provider:   Norman Herrlich, MD    Other Instructions Stop aspirin for 1 week and then restart.  Important Information About Sugar

## 2022-07-11 ENCOUNTER — Ambulatory Visit: Payer: 59 | Attending: Cardiology

## 2022-07-11 DIAGNOSIS — I5042 Chronic combined systolic (congestive) and diastolic (congestive) heart failure: Secondary | ICD-10-CM

## 2022-07-11 DIAGNOSIS — E782 Mixed hyperlipidemia: Secondary | ICD-10-CM

## 2022-07-11 DIAGNOSIS — I2510 Atherosclerotic heart disease of native coronary artery without angina pectoris: Secondary | ICD-10-CM | POA: Diagnosis not present

## 2022-07-11 DIAGNOSIS — I255 Ischemic cardiomyopathy: Secondary | ICD-10-CM | POA: Diagnosis not present

## 2022-07-11 DIAGNOSIS — Z951 Presence of aortocoronary bypass graft: Secondary | ICD-10-CM | POA: Diagnosis not present

## 2022-07-11 DIAGNOSIS — I110 Hypertensive heart disease with heart failure: Secondary | ICD-10-CM | POA: Diagnosis not present

## 2022-07-11 LAB — ECHOCARDIOGRAM COMPLETE
Area-P 1/2: 3.2 cm2
Calc EF: 43.9 %
MV M vel: 3.91 m/s
MV Peak grad: 61.2 mmHg
P 1/2 time: 498 msec
S' Lateral: 4.3 cm
Single Plane A2C EF: 37.9 %
Single Plane A4C EF: 46.5 %

## 2022-09-29 ENCOUNTER — Other Ambulatory Visit: Payer: Self-pay | Admitting: Cardiology

## 2022-11-29 ENCOUNTER — Ambulatory Visit: Payer: 59 | Admitting: Neurology

## 2023-03-19 ENCOUNTER — Other Ambulatory Visit: Payer: Self-pay | Admitting: Cardiology

## 2023-04-11 ENCOUNTER — Other Ambulatory Visit: Payer: Self-pay | Admitting: Cardiology

## 2023-04-13 ENCOUNTER — Encounter: Payer: Self-pay | Admitting: Neurology

## 2023-04-13 ENCOUNTER — Ambulatory Visit: Payer: 59 | Admitting: Neurology

## 2023-04-13 VITALS — BP 103/67 | HR 80 | Ht 59.0 in | Wt 181.6 lb

## 2023-04-13 DIAGNOSIS — G40009 Localization-related (focal) (partial) idiopathic epilepsy and epileptic syndromes with seizures of localized onset, not intractable, without status epilepticus: Secondary | ICD-10-CM

## 2023-04-13 DIAGNOSIS — G5602 Carpal tunnel syndrome, left upper limb: Secondary | ICD-10-CM | POA: Diagnosis not present

## 2023-04-13 MED ORDER — LEVETIRACETAM ER 500 MG PO TB24: ORAL_TABLET | ORAL | 3 refills | Status: DC
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
## 2023-04-13 NOTE — Patient Instructions (Signed)
Good to see you.  Continue Levetiracetam ER (Keppra) 500mg : Take 2 tablets in AM, 1 tablet in PM  2. Referral will be sent to the Hand specialist in [place]  3. Discuss shoulder pain with Dr. Nedra Hai  4. Follow-up in 6 months, call for any changes   Seizure Precautions: 1. If medication has been prescribed for you to prevent seizures, take it exactly as directed.  Do not stop taking the medicine without talking to your doctor first, even if you have not had a seizure in a long time.   2. Avoid activities in which a seizure would cause danger to yourself or to others.  Don't operate dangerous machinery, swim alone, or climb in high or dangerous places, such as on ladders, roofs, or girders.  Do not drive unless your doctor says you may.  3. If you have any warning that you may have a seizure, lay down in a safe place where you can't hurt yourself.    4.  No driving for 6 months from last seizure, as per [facility].   Please refer to the following link on the [employer]ile[employer] of [employer]'s website for more information: http://www.epilepsyfoundation.org/answerplace/Social/driving/drivingu.cfm   5.  Maintain good sleep hygiene.  6.  Contact your doctor if you have any problems that may be related to the medicine you are taking.  7.  Call 911 and bring the patient back to the ED if:        A.  The seizure lasts longer than 5 minutes.       B.  The patient doesn't awaken shortly after the seizure  C.  The patient has new problems such as difficulty seeing, speaking or moving  D.  The patient was injured during the seizure  E.  The patient has a temperature over 102 F (39C)  F.  The patient vomited and now is having trouble breathing

## 2023-05-07 DIAGNOSIS — G5602 Carpal tunnel syndrome, left upper limb: Secondary | ICD-10-CM

## 2023-05-07 HISTORY — DX: Carpal tunnel syndrome, left upper limb: G56.02

## 2023-07-10 ENCOUNTER — Other Ambulatory Visit: Payer: Self-pay | Admitting: Cardiology

## 2023-07-23 DIAGNOSIS — G5620 Lesion of ulnar nerve, unspecified upper limb: Secondary | ICD-10-CM

## 2023-07-23 HISTORY — DX: Lesion of ulnar nerve, unspecified upper limb: G56.20

## 2023-07-24 ENCOUNTER — Telehealth: Payer: Self-pay | Admitting: *Deleted

## 2023-07-24 NOTE — Telephone Encounter (Signed)
   Name: Teresa [name]  DOB: [birth year]  MRN: [id number]  Primary Cardiologist: Thomasene Ripple, DO  Chart reviewed as part of pre-operative protocol coverage. Because of Mitsue L Moltz's past medical history and time since last visit, she will require a follow-up in-office visit in order to better assess preoperative cardiovascular risk. Pt has not been seen in over a year.   Pre-op covering staff: - Please schedule appointment and call patient to inform them. If patient already had an upcoming appointment within acceptable timeframe, please add "pre-op clearance" to the appointment notes so provider is aware. - Please contact requesting surgeon's office via preferred method (i.e, phone, fax) to inform them of need for appointment prior to surgery.   Joylene Grapes, NP  07/24/2023, 5:01 PM

## 2023-07-24 NOTE — Telephone Encounter (Signed)
   Pre-operative Risk Assessment    Patient Name: Teresa [name]  DOB: 07/21/1961 MRN: [id number]  DATE OF LAST VISIT: 07/10/22 DR. MUNLEY DATE OF NEXT VISIT: 09/10/23 DR. MUNLEY; 1 YR F/U    Request for Surgical Clearance    Procedure:   LEFT CARPAL TUNNEL AND CUBITAL TUNNEL RELEASE  Date of Surgery:  Clearance TBD                                 Surgeon:  DR. CHARLES BENFIELD Surgeon's Group or Practice Name:  [facility] Phone number:  [phone] ATTN: KERRI MAZE Fax number:  [phone]   Type of Clearance Requested:   - Medical  - Pharmacy:  Hold Aspirin     Type of Anesthesia:  MAC w/REGIONAL BLOCK   Additional requests/questions:    [name]   07/24/2023, 9:25 AM

## 2023-07-24 NOTE — Telephone Encounter (Signed)
Looks like pt is following Dr. Dulce Sellar in [place]. Left message to call back to schedule a sooner appt with Dr. Dulce Sellar as she is now needing preop clearance as well as her yrly f/u that is currently scheduled for 09/2023.

## 2023-07-25 ENCOUNTER — Other Ambulatory Visit: Payer: Self-pay | Admitting: Cardiology

## 2023-07-25 NOTE — Progress Notes (Addendum)
Cardiology Office Note:  .   Date:  07/26/2023  ID:  Teresa [name], DOB 1960-09-14, MRN [id number] PCP: Simone Curia, MD  [facility] Providers Cardiologist:  Norman Herrlich, MD    History of Present Illness: .   Teresa [name] is a 62 y.o. 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  07/11/2022 echo EF 45 to 50%, grade 1 DD, mild MR, mild AR 12/27/2020 monitor average heart rate 60 bpm, rare PACs rare PVCs  09/10/2020 echo EF 40 to 45%, no valvular abnormalities 08/13/2020 monitor predominantly sinus rhythm, average heart rate 67 bpm, unremarkable study 04/25/2020 non-STEMI >> CABG  x 4 >> atrial fibrillation surrounding perioperative time 07/08/2020 echo EF 45 to 50%, grade 2 DD  Most recently evaluated by Dr. Dulce Sellar on 07/10/2022, she was well from a cardiac perspective, echocardiogram was arranged for evaluation of cardiomyopathy, she advised to follow-up in 1 year.  She presents today accompanied by her sister, for preoperative evaluation.  She has been doing stable since she was last evaluated in our office.  She states she does have episode of chest pain, hard for her to explain, states it is essentially unchanged over the last year.  She also has some DOE, asked her to explain when she experiences this, typically when she is making her bed. Also some dizziness with position changes. She denies palpitations, dyspnea, pnd, orthopnea, n, v, syncope, edema, weight gain, or early satiety.   ROS: Review of Systems  Respiratory:  Positive for shortness of breath.   Musculoskeletal:  Positive for joint pain.  Neurological:  Positive for dizziness.  All other systems reviewed and are negative.    Studies Reviewed: Marland Kitchen   EKG Interpretation Date/Time:  Thursday July 26 2023 13:36:29 EST Ventricular Rate:  71 PR Interval:  126 QRS Duration:  118 QT  Interval:  414 QTC Calculation: 449 R Axis:   26  Text Interpretation: Normal sinus rhythm - no changes Incomplete right bundle branch block Inferior infarct , age undetermined Abnormal ECG No previous ECGs available Confirmed by Wallis Bamberg [phone]) on 07/26/2023 1:51:24 PM    Cardiac Studies & Procedures      ECHOCARDIOGRAM  ECHOCARDIOGRAM COMPLETE 07/11/2022  Narrative ECHOCARDIOGRAM REPORT    Patient Name:   Teresa [name] Date of Exam: 07/11/2022 Medical Rec #:  [id number]    Height:       59.0 in Accession #:    [id number]   Weight:       184.0 lb Date of Birth:  [birth year]   BSA:          1.780 m Patient Age:    61 years     BP:           126/82 mmHg Patient Gender: F            HR:           68 bpm. Exam Location:  [facility]  Procedure: 2D Echo, Cardiac Doppler, Color Doppler and Strain Analysis  Indications:    Coronary artery disease involving native coronary artery of native heart without angina pectoris [I25.10 (ICD-10-CM)]; Hx of CABG [Z95.1 (ICD-10-CM)]; Ischemic cardiomyopathy [I25.5 (ICD-10-CM)]; Hypertensive heart disease with chronic combined systolic and diastolic congestive heart failure (HCC) [I11.0, I50.42 (ICD-10-CM)]; Mixed hyperlipidemia [E78.2 (ICD-10-CM)]  History:        Patient has prior history of Echocardiogram examinations, most  recent 09/10/2020. CHF, CAD; Risk Factors:Hypertension and Dyslipidemia.  Sonographer:    Margreta Journey RDCS Referring Phys: [id number] BRIAN J [facility]   Sonographer Comments: Suboptimal parasternal window and suboptimal subcostal window. Restricted mobility. Recent Fall. IMPRESSIONS   1. Systolic function can only be assessed from the apical views and is global. Left ventricular ejection fraction, by estimation, is 45 to 50%. The left ventricle has mildly decreased function. Left ventricular endocardial border not optimally defined to evaluate regional wall motion. The left ventricular internal cavity size was mildly  dilated. Left ventricular diastolic parameters are consistent with Grade I diastolic dysfunction (impaired relaxation). 2. Right ventricular systolic function was not well visualized. The right ventricular size is not well visualized. 3. The mitral valve is normal in structure. Mild mitral valve regurgitation. No evidence of mitral stenosis. 4. The aortic valve is tricuspid. Aortic valve regurgitation is mild. No aortic stenosis is present. 5. The inferior vena cava is normal in size with greater than 50% respiratory variability, suggesting right atrial pressure of 3 mmHg.  FINDINGS Left Ventricle: Systolic function can only be assessed from the apical views and is global. Left ventricular ejection fraction, by estimation, is 45 to 50%. The left ventricle has mildly decreased function. Left ventricular endocardial border not optimally defined to evaluate regional wall motion. Global longitudinal strain performed but not reported based on interpreter judgement due to suboptimal tracking. The left ventricular internal cavity size was mildly dilated. There is no left ventricular hypertrophy. Left ventricular diastolic parameters are consistent with Grade I diastolic dysfunction (impaired relaxation). Indeterminate filling pressures.  Right Ventricle: The right ventricular size is not well visualized. Right vetricular wall thickness was not assessed. Right ventricular systolic function was not well visualized.  Left Atrium: Left atrial size was normal in size.  Right Atrium: Right atrial size was not assessed.  Pericardium: There is no evidence of pericardial effusion.  Mitral Valve: The mitral valve is normal in structure. Mild mitral valve regurgitation. No evidence of mitral valve stenosis.  Tricuspid Valve: The tricuspid valve is not well visualized. Tricuspid valve regurgitation is not demonstrated. No evidence of tricuspid stenosis.  Aortic Valve: The aortic valve is tricuspid. Aortic valve  regurgitation is mild. Aortic regurgitation PHT measures 498 msec. No aortic stenosis is present.  Pulmonic Valve: The pulmonic valve was not well visualized. Pulmonic valve regurgitation is not visualized. No evidence of pulmonic stenosis.  Aorta: The aortic root was not well visualized, the ascending aorta was not well visualized and the aortic arch was not well visualized.  Venous: A normal flow pattern is recorded from the right upper pulmonary vein. The inferior vena cava is normal in size with greater than 50% respiratory variability, suggesting right atrial pressure of 3 mmHg.  IAS/Shunts: No atrial level shunt detected by color flow Doppler.   LEFT VENTRICLE PLAX 2D LVIDd:         5.50 cm      Diastology LVIDs:         4.30 cm      LV e' medial:    5.22 cm/s LV PW:         1.00 cm      LV E/e' medial:  11.3 LV IVS:        1.00 cm      LV e' lateral:   9.25 cm/s LVOT diam:     2.00 cm      LV E/e' lateral: 6.4 LV SV:  78 LV SV Index:   44 LVOT Area:     3.14 cm  LV Volumes (MOD) LV vol d, MOD A2C: 82.6 ml LV vol d, MOD A4C: 105.0 ml LV vol s, MOD A2C: 51.3 ml LV vol s, MOD A4C: 56.2 ml LV SV MOD A2C:     31.3 ml LV SV MOD A4C:     105.0 ml LV SV MOD BP:      43.1 ml  RIGHT VENTRICLE            IVC RV Basal diam:  2.60 cm    IVC diam: 1.40 cm RV S prime:     7.94 cm/s TAPSE (M-mode): 1.1 cm  LEFT ATRIUM             Index        RIGHT ATRIUM           Index LA diam:        3.00 cm 1.69 cm/m   RA Area:     13.70 cm LA Vol (A2C):   24.1 ml 13.54 ml/m  RA Volume:   34.90 ml  19.61 ml/m LA Vol (A4C):   23.6 ml 13.26 ml/m LA Biplane Vol: 24.0 ml 13.48 ml/m AORTIC VALVE LVOT Vmax:   124.00 cm/s LVOT Vmean:  77.300 cm/s LVOT VTI:    0.247 m AI PHT:      498 msec  AORTA Ao Root diam: 3.60 cm Ao Asc diam:  3.60 cm Ao Desc diam: 1.90 cm  MITRAL VALVE MV Area (PHT): 3.20 cm    SHUNTS MV Decel Time: 237 msec    Systemic VTI:  0.25 m MR Peak grad: 61.2  mmHg    Systemic Diam: 2.00 cm MR Vmax:      391.00 cm/s MV E velocity: 59.10 cm/s MV A velocity: 60.80 cm/s MV E/A ratio:  0.97  Norman Herrlich MD Electronically signed by Norman Herrlich MD Signature Date/Time: 07/11/2022/5:30:14 PM    Final   MONITORS  LONG TERM MONITOR (3-14 DAYS) 01/13/2021  Narrative Patch Wear Time:  9 days and 9 hours starting Dec 27, 2020. Indication: Dizziness  Patient had a minimum HR of 46 bpm, maximum HR of 98 bpm, and average HR of 60 bpm. Predominant underlying rhythm was Sinus Rhythm. Bundle Branch Block/IVCD was present.  Premature atrial complexes were rare. Premature ventricular complexes were rare.  Symptoms are associated with sinus rhythm.  No ventricular tachycardia, no pauses, no supraventricular tachycardia noted.  Conclusion: Normal/unremarkable study with no significant arrhythmia.           Risk Assessment/Calculations:    CHA2DS2-VASc Score = 3   This indicates a 3.2% annual risk of stroke. The patient's score is based upon: CHF History: 1 HTN History: 0 Diabetes History: 0 Stroke History: 0 Vascular Disease History: 1 Age Score: 0 Gender Score: 1            Physical Exam:   VS:  BP 112/77 (BP Location: Right Arm, Patient Position: Sitting, Cuff Size: Normal)   Pulse 71   Ht 4\' 11"  (1.499 m)   Wt 177 lb (80.3 kg)   SpO2 95%   BMI 35.75 kg/m    Wt Readings from Last 3 Encounters:  07/26/23 177 lb (80.3 kg)  04/13/23 181 lb 9.6 oz (82.4 kg)  07/10/22 184 lb (83.5 kg)    GEN: Well nourished, well developed in no acute distress NECK: No JVD; No carotid bruits CARDIAC: RRR, no murmurs, rubs, gallops  RESPIRATORY:  Clear to auscultation without rales, wheezing or rhonchi  ABDOMEN: Soft, non-tender, non-distended EXTREMITIES:  No edema; No deformity   ASSESSMENT AND PLAN: .   CAD -CABG x 4 in 2021. Continue aspirin 81 mg daily, continue Lipitor 40 mg daily, continue metoprolol 12.5 mg daily.  She does have episodes  of chest pain that are hard for her to explain although they do not sound to be consistent with angina, also has dizziness, some DOE-I think this is all a component of deconditioning, along with heart failure however she has not had an ischemic evaluation since her bypass surgery in 2021.  Will plan for Lexiscan.  HFmrEF - echo 07/2022 EF 45 to 50%, NYHA class II.  Continue Lasix 20 mg daily, continue metoprolol 12.5 mg daily.   Dyslipidemia-this appears to be monitored by her PCP, currently on Lipitor 40 mg daily.  Will request recent lab work.  Preoperative evaluation-she has upcoming carpal tunnel surgery, unfortunately she is not able to meet greater than 4 METS of physical activity, this is likely her baseline however she has not had an ischemic evaluation since her bypass surgery in 2021, we will arrange for Lexiscan for stress evaluation.   **addendum, as patient was not able to meet > 4 METS of physical activity, we arranged a stress evaluation which was negative. She can undergo surgery at an acceptable risk. Regarding her aspirin, she may hold this for 7 days prior to surgery and resumed as soon as possible after surgery.      Informed Consent   Shared Decision Making/Informed Consent The risks [chest pain, shortness of breath, cardiac arrhythmias, dizziness, blood pressure fluctuations, myocardial infarction, stroke/transient ischemic attack, nausea, vomiting, allergic reaction, radiation exposure, metallic taste sensation and life-threatening complications (estimated to be 1 in 10,000)], benefits (risk stratification, diagnosing coronary artery disease, treatment guidance) and alternatives of a nuclear stress test were discussed in detail with Ms. Lockmiller and she agrees to proceed.     Dispo: Will request labs from PCP, stress evaluation for upcoming surgery.  Keep follow-up that is already scheduled with Dr. Dulce Sellar.  Signed, Flossie Dibble, NP

## 2023-07-25 NOTE — Telephone Encounter (Signed)
2nd attempt to reach pt, see previous notes.

## 2023-07-26 ENCOUNTER — Encounter: Payer: Self-pay | Admitting: Cardiology

## 2023-07-26 ENCOUNTER — Ambulatory Visit: Payer: 59 | Attending: Cardiology | Admitting: Cardiology

## 2023-07-26 VITALS — BP 112/77 | HR 71 | Ht 59.0 in | Wt 177.0 lb

## 2023-07-26 DIAGNOSIS — E785 Hyperlipidemia, unspecified: Secondary | ICD-10-CM

## 2023-07-26 DIAGNOSIS — I5020 Unspecified systolic (congestive) heart failure: Secondary | ICD-10-CM

## 2023-07-26 DIAGNOSIS — I4892 Unspecified atrial flutter: Secondary | ICD-10-CM

## 2023-07-26 DIAGNOSIS — I2510 Atherosclerotic heart disease of native coronary artery without angina pectoris: Secondary | ICD-10-CM

## 2023-07-26 DIAGNOSIS — Z01810 Encounter for preprocedural cardiovascular examination: Secondary | ICD-10-CM

## 2023-07-26 DIAGNOSIS — Z720 Tobacco use: Secondary | ICD-10-CM

## 2023-07-26 NOTE — Telephone Encounter (Signed)
Patient is scheduled to see Wallis Bamberg, NP today for preop clearance

## 2023-07-26 NOTE — Patient Instructions (Signed)
Medication Instructions:  Your physician recommends that you continue on your current medications as directed. Please refer to the Current Medication list given to you today.  *If you need a refill on your cardiac medications before your next appointment, please call your pharmacy*   Lab Work: None ordered If you have labs (blood work) drawn today and your tests are completely normal, you will receive your results only by: MyChart Message (if you have MyChart) OR A paper copy in the mail If you have any lab test that is abnormal or we need to change your treatment, we will call you to review the results.   Testing/Procedures: You are scheduled for a Myocardial Perfusion Imaging Study.  Please arrive 15 minutes prior to your appointment time for registration and insurance purposes.  The test will take approximately 3 to 4 hours to complete; you may bring reading material.  If someone comes with you to your appointment, they will need to remain in the main lobby due to limited space in the testing area.   How to prepare for your Myocardial Perfusion Test: Do not eat or drink 3 hours prior to your test, except you may have water. Do not consume products containing caffeine (regular or decaffeinated) 12 hours prior to your test. (ex: coffee, chocolate, sodas, tea). Do bring a list of your current medications with you.  If not listed below, you may take your medications as normal. Do not take metoprolol (Lopressor, Toprol) for 24 hours prior to the test.  Bring the medication to your appointment as you may be required to take it once the test is complete. Do wear comfortable clothes (no dresses or overalls) and walking shoes, tennis shoes preferred (No heels or open toe shoes are allowed). Do NOT wear cologne, perfume, aftershave, or lotions (deodorant is allowed). If these instructions are not followed, your test will have to be rescheduled.  If you cannot keep your appointment, please  provide 24 hours notification to the Nuclear Lab, to avoid a possible $50 charge to your account.  Follow-Up: At [facility], you and your health needs are our priority.  As part of our continuing mission to provide you with exceptional heart care, we have created designated Provider Care Teams.  These Care Teams include your primary Cardiologist (physician) and Advanced Practice Providers (APPs -  Physician Assistants and Nurse Practitioners) who all work together to provide you with the care you need, when you need it.  We recommend signing up for the patient portal called "MyChart".  Sign up information is provided on this After Visit Summary.  MyChart is used to connect with patients for Virtual Visits (Telemedicine).  Patients are able to view lab/test results, encounter notes, upcoming appointments, etc.  Non-urgent messages can be sent to your provider as well.   To learn more about what you can do with MyChart, go to [email or web address].    Your next appointment:   Keep follow up appointment with Dr. Dulce Sellar   Other Instructions  Cardiac Nuclear Scan A cardiac nuclear scan is a test that is done to check the flow of blood to your heart. It is done when you are resting and when you are exercising. The test looks for problems such as: Not enough blood reaching a portion of the heart. The heart muscle not working as it should. You may need this test if you have: Heart disease. Lab results that are not normal. Had heart surgery or a balloon procedure to open up  blocked arteries (angioplasty) or a small mesh tube (stent). Chest pain. Shortness of breath. Had a heart attack. In this test, a special dye (tracer) is put into your bloodstream. The tracer will travel to your heart. A camera will then take pictures of your heart to see how the tracer moves through your heart. This test is usually done at a hospital and takes 2-4 hours. Tell a doctor about: Any allergies you  have. All medicines you are taking, including vitamins, herbs, eye drops, creams, and over-the-counter medicines. Any bleeding problems you have. Any surgeries you have had. Any medical conditions you have. Whether you are pregnant or may be pregnant. Any history of asthma or long-term (chronic) lung disease. Any history of heart rhythm disorders or heart valve conditions. What are the risks? Your doctor will talk with you about risks. These may include: Serious chest pain and heart attack. This is only a risk if the stress portion of the test is done. Fast or uneven heartbeats (palpitations). A feeling of warmth in your chest. This feeling usually does not last long. Allergic reaction to the tracer. Shortness of breath or trouble breathing. What happens before the test? Ask your doctor about changing or stopping your normal medicines. Follow instructions from your doctor about what you cannot eat or drink. Remove your jewelry on the day of the test. Ask your doctor if you need to avoid nicotine or caffeine. What happens during the test? An IV tube will be inserted into one of your veins. Your doctor will give you a small amount of tracer through the IV tube. You will wait for 20-40 minutes while the tracer moves through your bloodstream. Your heart will be monitored with an electrocardiogram (ECG). You will lie down on an exam table. Pictures of your heart will be taken for about 15-20 minutes. You may also have a stress test. For this test, one of these things may be done: You will be asked to exercise on a treadmill or a stationary bike. You will be given medicines that will make your heart work harder. This is done if you are unable to exercise. When blood flow to your heart has peaked, a tracer will again be given through the IV tube. After 20-40 minutes, you will get back on the exam table. More pictures will be taken of your heart. Depending on the tracer that is used, more  pictures may need to be taken 3-4 hours later. Your IV tube will be removed when the test is over. The test may vary among doctors and hospitals. What happens after the test? Ask your doctor: Whether you can return to your normal schedule, including diet, activities, travel, and medicines. Whether you should drink more fluids. This will help to remove the tracer from your body. Ask your doctor, or the department that is doing the test: When will my results be ready? How will I get my results? What are my treatment options? What other tests do I need? What are my next steps? This information is not intended to replace advice given to you by your health care provider. Make sure you discuss any questions you have with your health care provider. Document Revised: 12/20/2021 Document Reviewed: 12/20/2021 Elsevier Patient Education  2023 [employer].

## 2023-07-30 ENCOUNTER — Encounter (HOSPITAL_COMMUNITY): Payer: Self-pay

## 2023-08-02 ENCOUNTER — Ambulatory Visit: Payer: 59 | Attending: Cardiology

## 2023-08-02 DIAGNOSIS — I2510 Atherosclerotic heart disease of native coronary artery without angina pectoris: Secondary | ICD-10-CM | POA: Diagnosis not present

## 2023-08-02 LAB — MYOCARDIAL PERFUSION IMAGING
LV dias vol: 36 mL (ref 46–106)
LV sys vol: 84 mL
Nuc Stress EF: 57 %
Peak HR: 89 {beats}/min
Rest HR: 64 {beats}/min
Rest Nuclear Isotope Dose: 9.9 mCi
SDS: 2
SRS: 6
SSS: 8
ST Depression (mm): 0 mm
Stress Nuclear Isotope Dose: 29.3 mCi
TID: 0.97

## 2023-08-02 MED ORDER — TECHNETIUM TC 99M TETROFOSMIN IV KIT
9.9000 | PACK | Freq: Once | INTRAVENOUS | Status: AC | PRN
  Administered 2023-08-02: 9.9 via INTRAVENOUS

## 2023-08-02 MED ORDER — REGADENOSON 0.4 MG/5ML IV SOLN
0.4000 mg | Freq: Once | INTRAVENOUS | Status: AC
  Administered 2023-08-02: 0.4 mg via INTRAVENOUS

## 2023-08-02 MED ORDER — TECHNETIUM TC 99M TETROFOSMIN IV KIT
29.3000 | PACK | Freq: Once | INTRAVENOUS | Status: AC | PRN
Start: 2023-08-02 — End: 2023-08-02
  Administered 2023-08-02: 29.3 via INTRAVENOUS

## 2023-09-06 ENCOUNTER — Encounter: Payer: Self-pay | Admitting: Neurology

## 2023-09-07 ENCOUNTER — Encounter: Payer: Self-pay | Admitting: Cardiology

## 2023-09-09 NOTE — Progress Notes (Unsigned)
Cardiology Office Note:    Date:  09/10/2023   ID:  Teresa [name], DOB 15-Aug-1960, MRN [id number]  PCP:  Simone Curia, MD  Cardiologist:  Norman Herrlich, MD    Referring MD: Simone Curia, MD    ASSESSMENT:    1. Coronary artery disease involving native coronary artery of native heart without angina pectoris   2. Hx of CABG   3. Ischemic cardiomyopathy   4. Hypertensive heart disease with chronic combined systolic and diastolic congestive heart failure (HCC)   5. Hyperlipidemia LDL goal <70    PLAN:    In order of problems listed above:  Doing well following complex CAD CABG and a prolonged hospital recovery course. Continue medical treatment including her aspirin 81 mg daily high intensity statin atorvastatin and Zetia 40 and 10 mg daily and her beta-blocker metoprolol Toprol-XL 12 and half milligrams daily Continue treatment including her diuretic beta-blocker recheck echocardiogram.  If ejection fraction is reduced she would benefit from a vasodilator or Entresto Continue her current loop diuretic she has no edema furosemide 20 mg every other day   Next appointment: 1 year   Medication Adjustments/Labs and Tests Ordered: Current medicines are reviewed at length with the patient today.  Concerns regarding medicines are outlined above.  No orders of the defined types were placed in this encounter.  No orders of the defined types were placed in this encounter.    History of Present Illness:    Teresa [name] is a 63 y.o. female with a hx of CAD with CABG September 2021 initially with severe LV dysfunction EF 25 to 30% having improved with guideline directed therapy hypertensive heart disease hyperlipidemia embolization therapy of intracranial aneurysm January 2022 complicated with seizure disorder last seen 07/10/2022.  Following that visit she had an echocardiogram performed 07/11/2022 showing left ventricular systolic function mildly diminished EF in the range of 45 to 50% normal  right ventricular size and function and mild mitral and aortic regurgitation.  Compliance with diet, lifestyle and medications: Yes  Pending carpal tunnel surgery next week and has an appointment with her PCP tomorrow labs to be done AND drawn APO B also. At times she gets brief momentary sharp localized right costochondral chest pain reproducible on exam Not having angina edema shortness of breath orthopnea palpitation or syncope Past Medical History:  Diagnosis Date   Acute on chronic respiratory failure with hypoxia (HCC)    Acute renal failure due to tubular necrosis 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   Cardiogenic shock (HCC)    Carpal tunnel syndrome of left wrist 05/07/2023   Coronary artery disease 08/13/2020   Coronary artery disease involving native coronary artery of native heart    Coronary artery disease involving native coronary artery of native heart without angina pectoris 09/17/2020   Depressed left ventricular ejection fraction 08/13/2020   Depression, unspecified 10/09/2015   Essential hypertension 10/06/2015   Gastro-esophageal reflux disease without esophagitis 10/06/2015   HFrEF (heart failure with reduced ejection fraction) (HCC) 08/13/2020   History of tonic-clonic seizures 04/25/2020   Hyperlipidemia 10/06/2015   Hypertensive heart disease 10/06/2015   Ischemic cardiomyopathy 08/13/2020   Mixed hyperlipidemia 09/17/2020   Pericardial effusion 08/13/2020   Post operative atrial fibrillation (HCC) 08/13/2020   Seizure disorder (HCC) 03/03/2016   Shortness of breath 04/25/2020   Syncope 04/25/2020   Tobacco use 08/13/2020   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   albuterol (VENTOLIN HFA) 108 (90 Base) MCG/ACT inhaler Inhale 1 puff into the lungs every  6 (six) hours as needed for wheezing or shortness of breath.   aspirin EC 81 MG tablet Take 81 mg by mouth daily. Swallow whole.   atorvastatin (LIPITOR) 40 MG tablet Take 1 tablet (40 mg total) by mouth daily. Call [phone] to make an appointment for further refills.   Cholecalciferol (VITAMIN D) 50 MCG (2000 UT) tablet Take 2,000 Units by mouth daily.   citalopram (CELEXA) 20 MG tablet Take 20 mg by mouth daily.   ezetimibe (ZETIA) 10 MG tablet TAKE 1 TABLET BY MOUTH EVERY DAY   ferrous sulfate 324 MG TBEC Take 324 mg by mouth daily with breakfast.   furosemide (LASIX) 20 MG tablet Take 20 mg by mouth every other day.   KLOR-CON M20 20 MEQ tablet Take 20 mEq by mouth daily.   levETIRAcetam (KEPPRA XR) 500 MG 24 hr tablet Take 2 tablets in AM, 1 tablet in PM   meloxicam (MOBIC) 7.5 MG tablet Take 7.5 mg by mouth 2 (two) times daily as needed for pain.   metoprolol succinate (TOPROL-XL) 25 MG 24 hr tablet Take 0.5 tablets (12.5 mg total) by mouth daily. Patient needs appointment for further refills. 2nd attempt   naproxen sodium (ALEVE) 220 MG tablet Take 1 tablet (220 mg total) by mouth daily. Midday   Omega-3 Fatty Acids (FISH OIL) 1000 MG CAPS Take 1,000 mg by mouth daily.   pantoprazole (PROTONIX) 40 MG tablet Take 40 mg by mouth daily.   potassium chloride (KLOR-CON) 20 MEQ packet Take 20 mEq by mouth daily.   traMADol (ULTRAM) 50 MG tablet Take 50 mg by mouth 3 (three) times daily as needed for pain.   zolpidem (AMBIEN) 10 MG tablet Take 10 mg by mouth at bedtime.      EKGs/Labs/Other Studies Reviewed:    The following studies were reviewed today:  Cardiac Studies & Procedures     STRESS TESTS  MYOCARDIAL PERFUSION IMAGING 08/02/2023  Narrative   Findings are consistent with no ischemia and no infarction. The study is low risk.   No ST deviation was noted.   Left ventricular function is normal. Nuclear stress EF: 57%. The left ventricular ejection fraction is normal  (55-65%). End diastolic cavity size is normal.   Prior study not available for comparison.  ECHOCARDIOGRAM  ECHOCARDIOGRAM COMPLETE 07/11/2022  Narrative ECHOCARDIOGRAM REPORT    Patient Name:   Teresa [name] Date of Exam: 07/11/2022 Medical Rec #:  [id number]    Height:       59.0 in Accession #:    [id number]   Weight:       184.0 lb Date of Birth:  1960/12/29   BSA:          1.780 m Patient Age:    61 years     BP:           126/82 mmHg Patient Gender: F            HR:           68 bpm. Exam Location:  [place]  Procedure: 2D Echo, Cardiac Doppler, Color Doppler and Strain Analysis  Indications:    Coronary artery disease involving native coronary artery of native heart without angina pectoris [I25.10 (ICD-10-CM)]; Hx of CABG [  Z95.1 (ICD-10-CM)]; Ischemic cardiomyopathy [I25.5 (ICD-10-CM)]; Hypertensive heart disease with chronic combined systolic and diastolic congestive heart failure (HCC) [I11.0, I50.42 (ICD-10-CM)]; Mixed hyperlipidemia [E78.2 (ICD-10-CM)]  History:        Patient has prior history of Echocardiogram examinations, most recent 09/10/2020. CHF, CAD; Risk Factors:Hypertension and Dyslipidemia.  Sonographer:    Margreta Journey RDCS Referring Phys: [id number] Lani Havlik J [facility]   Sonographer Comments: Suboptimal parasternal window and suboptimal subcostal window. Restricted mobility. Recent Fall. IMPRESSIONS   1. Systolic function can only be assessed from the apical views and is global. Left ventricular ejection fraction, by estimation, is 45 to 50%. The left ventricle has mildly decreased function. Left ventricular endocardial border not optimally defined to evaluate regional wall motion. The left ventricular internal cavity size was mildly dilated. Left ventricular diastolic parameters are consistent with Grade I diastolic dysfunction (impaired relaxation). 2. Right ventricular systolic function was not well visualized. The right ventricular size is not well  visualized. 3. The mitral valve is normal in structure. Mild mitral valve regurgitation. No evidence of mitral stenosis. 4. The aortic valve is tricuspid. Aortic valve regurgitation is mild. No aortic stenosis is present. 5. The inferior vena cava is normal in size with greater than 50% respiratory variability, suggesting right atrial pressure of 3 mmHg.  FINDINGS Left Ventricle: Systolic function can only be assessed from the apical views and is global. Left ventricular ejection fraction, by estimation, is 45 to 50%. The left ventricle has mildly decreased function. Left ventricular endocardial border not optimally defined to evaluate regional wall motion. Global longitudinal strain performed but not reported based on interpreter judgement due to suboptimal tracking. The left ventricular internal cavity size was mildly dilated. There is no left ventricular hypertrophy. Left ventricular diastolic parameters are consistent with Grade I diastolic dysfunction (impaired relaxation). Indeterminate filling pressures.  Right Ventricle: The right ventricular size is not well visualized. Right vetricular wall thickness was not assessed. Right ventricular systolic function was not well visualized.  Left Atrium: Left atrial size was normal in size.  Right Atrium: Right atrial size was not assessed.  Pericardium: There is no evidence of pericardial effusion.  Mitral Valve: The mitral valve is normal in structure. Mild mitral valve regurgitation. No evidence of mitral valve stenosis.  Tricuspid Valve: The tricuspid valve is not well visualized. Tricuspid valve regurgitation is not demonstrated. No evidence of tricuspid stenosis.  Aortic Valve: The aortic valve is tricuspid. Aortic valve regurgitation is mild. Aortic regurgitation PHT measures 498 msec. No aortic stenosis is present.  Pulmonic Valve: The pulmonic valve was not well visualized. Pulmonic valve regurgitation is not visualized. No evidence of  pulmonic stenosis.  Aorta: The aortic root was not well visualized, the ascending aorta was not well visualized and the aortic arch was not well visualized.  Venous: A normal flow pattern is recorded from the right upper pulmonary vein. The inferior vena cava is normal in size with greater than 50% respiratory variability, suggesting right atrial pressure of 3 mmHg.  IAS/Shunts: No atrial level shunt detected by color flow Doppler.   LEFT VENTRICLE PLAX 2D LVIDd:         5.50 cm      Diastology LVIDs:         4.30 cm      LV e' medial:    5.22 cm/s LV PW:         1.00 cm      LV E/e' medial:  11.3 LV IVS:  1.00 cm      LV e' lateral:   9.25 cm/s LVOT diam:     2.00 cm      LV E/e' lateral: 6.4 LV SV:         78 LV SV Index:   44 LVOT Area:     3.14 cm  LV Volumes (MOD) LV vol d, MOD A2C: 82.6 ml LV vol d, MOD A4C: 105.0 ml LV vol s, MOD A2C: 51.3 ml LV vol s, MOD A4C: 56.2 ml LV SV MOD A2C:     31.3 ml LV SV MOD A4C:     105.0 ml LV SV MOD BP:      43.1 ml  RIGHT VENTRICLE            IVC RV Basal diam:  2.60 cm    IVC diam: 1.40 cm RV S prime:     7.94 cm/s TAPSE (M-mode): 1.1 cm  LEFT ATRIUM             Index        RIGHT ATRIUM           Index LA diam:        3.00 cm 1.69 cm/m   RA Area:     13.70 cm LA Vol (A2C):   24.1 ml 13.54 ml/m  RA Volume:   34.90 ml  19.61 ml/m LA Vol (A4C):   23.6 ml 13.26 ml/m LA Biplane Vol: 24.0 ml 13.48 ml/m AORTIC VALVE LVOT Vmax:   124.00 cm/s LVOT Vmean:  77.300 cm/s LVOT VTI:    0.247 m AI PHT:      498 msec  AORTA Ao Root diam: 3.60 cm Ao Asc diam:  3.60 cm Ao Desc diam: 1.90 cm  MITRAL VALVE MV Area (PHT): 3.20 cm    SHUNTS MV Decel Time: 237 msec    Systemic VTI:  0.25 m MR Peak grad: 61.2 mmHg    Systemic Diam: 2.00 cm MR Vmax:      391.00 cm/s MV E velocity: 59.10 cm/s MV A velocity: 60.80 cm/s MV E/A ratio:  0.97  Norman Herrlich MD Electronically signed by Norman Herrlich MD Signature Date/Time:  07/11/2022/5:30:14 PM    Final   MONITORS  LONG TERM MONITOR (3-14 DAYS) 01/13/2021  Narrative Patch Wear Time:  9 days and 9 hours starting Dec 27, 2020. Indication: Dizziness  Patient had a minimum HR of 46 bpm, maximum HR of 98 bpm, and average HR of 60 bpm. Predominant underlying rhythm was Sinus Rhythm. Bundle Branch Block/IVCD was present.  Premature atrial complexes were rare. Premature ventricular complexes were rare.  Symptoms are associated with sinus rhythm.  No ventricular tachycardia, no pauses, no supraventricular tachycardia noted.  Conclusion: Normal/unremarkable study with no significant arrhythmia.               Recent Labs: No results found for requested labs within last 365 days.  Recent Lipid Panel    Component Value Date/Time   CHOL 232 (H) 09/17/2020 1446   TRIG 161 (H) 09/17/2020 1446   HDL 46 09/17/2020 1446   CHOLHDL 5.0 (H) 09/17/2020 1446   LDLCALC 157 (H) 09/17/2020 1446    Physical Exam:    VS:  BP 120/74   Pulse 85   Ht 4\' 11"  (1.499 m)   Wt 183 lb (83 kg)   SpO2 95%   BMI 36.96 kg/m     Wt Readings from Last 3 Encounters:  09/10/23 183 lb (83 kg)  08/02/23 177 lb (80.3 kg)  07/26/23 177 lb (80.3 kg)     GEN:  Well nourished, well developed in no acute distress HEENT: Normal NECK: No JVD; No carotid bruits LYMPHATICS: No lymphadenopathy CARDIAC: Tenderness costochondral junction on the right RRR, no murmurs, rubs, gallops RESPIRATORY:  Clear to auscultation without rales, wheezing or rhonchi  ABDOMEN: Soft, non-tender, non-distended MUSCULOSKELETAL:  No edema; No deformity  SKIN: Warm and dry NEUROLOGIC:  Alert and oriented x 3 PSYCHIATRIC:  Normal affect    Signed, Norman Herrlich, MD  09/10/2023 2:36 PM    [facility]

## 2023-09-10 ENCOUNTER — Encounter: Payer: Self-pay | Admitting: Cardiology

## 2023-09-10 ENCOUNTER — Ambulatory Visit: Payer: Medicare HMO | Attending: Cardiology | Admitting: Cardiology

## 2023-09-10 VITALS — BP 120/74 | HR 85 | Ht 59.0 in | Wt 183.0 lb

## 2023-09-10 DIAGNOSIS — I5042 Chronic combined systolic (congestive) and diastolic (congestive) heart failure: Secondary | ICD-10-CM

## 2023-09-10 DIAGNOSIS — I2510 Atherosclerotic heart disease of native coronary artery without angina pectoris: Secondary | ICD-10-CM

## 2023-09-10 DIAGNOSIS — I255 Ischemic cardiomyopathy: Secondary | ICD-10-CM

## 2023-09-10 DIAGNOSIS — I110 Hypertensive heart disease with heart failure: Secondary | ICD-10-CM | POA: Diagnosis not present

## 2023-09-10 DIAGNOSIS — Z951 Presence of aortocoronary bypass graft: Secondary | ICD-10-CM

## 2023-09-10 DIAGNOSIS — E785 Hyperlipidemia, unspecified: Secondary | ICD-10-CM

## 2023-09-10 NOTE — Patient Instructions (Signed)
Medication Instructions:  Your physician recommends that you continue on your current medications as directed. Please refer to the Current Medication list given to you today.  *If you need a refill on your cardiac medications before your next appointment, please call your pharmacy*   Lab Work: None If you have labs (blood work) drawn today and your tests are completely normal, you will receive your results only by: MyChart Message (if you have MyChart) OR A paper copy in the mail If you have any lab test that is abnormal or we need to change your treatment, we will call you to review the results.   Testing/Procedures: Your physician has requested that you have an echocardiogram. Echocardiography is a painless test that uses sound waves to create images of your heart. It provides your doctor with information about the size and shape of your heart and how well your heart's chambers and valves are working. This procedure takes approximately one hour. There are no restrictions for this procedure. Please do NOT wear cologne, perfume, aftershave, or lotions (deodorant is allowed). Please arrive 15 minutes prior to your appointment time.  Please note: We ask at that you not bring children with you during ultrasound (echo/ vascular) testing. Due to room size and safety concerns, children are not allowed in the ultrasound rooms during exams. Our front office staff cannot provide observation of children in our lobby area while testing is being conducted. An adult accompanying a patient to their appointment will only be allowed in the ultrasound room at the discretion of the ultrasound technician under special circumstances. We apologize for any inconvenience.    Follow-Up: At [facility], you and your health needs are our priority.  As part of our continuing mission to provide you with exceptional heart care, we have created designated Provider Care Teams.  These Care Teams include your  primary Cardiologist (physician) and Advanced Practice Providers (APPs -  Physician Assistants and Nurse Practitioners) who all work together to provide you with the care you need, when you need it.  We recommend signing up for the patient portal called "MyChart".  Sign up information is provided on this After Visit Summary.  MyChart is used to connect with patients for Virtual Visits (Telemedicine).  Patients are able to view lab/test results, encounter notes, upcoming appointments, etc.  Non-urgent messages can be sent to your provider as well.   To learn more about what you can do with MyChart, go to [email or web address].    Your next appointment:   1 year(s)  Provider:   Norman Herrlich, MD    Other Instructions Please do an Apo B lab draw at Dr. Marigene Ehlers office in the am.

## 2023-09-10 NOTE — Addendum Note (Signed)
Addended by: Roxanne Mins I on: 09/10/2023 02:47 PM   Modules accepted: Orders

## 2023-09-11 ENCOUNTER — Telehealth: Payer: Self-pay | Admitting: Cardiology

## 2023-09-11 ENCOUNTER — Other Ambulatory Visit: Payer: Self-pay

## 2023-09-11 DIAGNOSIS — E785 Hyperlipidemia, unspecified: Secondary | ICD-10-CM

## 2023-09-11 NOTE — Progress Notes (Signed)
Patient's complete chart, PMH and all heart studies reviewed with Dr Chancy Milroy, he states that patient may be done at [facility].

## 2023-09-11 NOTE — Telephone Encounter (Signed)
Called patient and informed her that she could have her Apo B lab drawn at our office. Patient verbalized understanding and had no further questions at this time.

## 2023-09-11 NOTE — Telephone Encounter (Signed)
 Patient is calling stating her PCP was not able to draw the APOB lab due to it not being in  their system.  Due to this she is wanting to know if an order can be placed for her to have it performed at our office when she comes in for her echo.  Please advise.

## 2023-09-12 ENCOUNTER — Encounter (HOSPITAL_BASED_OUTPATIENT_CLINIC_OR_DEPARTMENT_OTHER): Payer: Self-pay | Admitting: Orthopedic Surgery

## 2023-09-12 ENCOUNTER — Other Ambulatory Visit: Payer: Self-pay

## 2023-09-12 ENCOUNTER — Other Ambulatory Visit: Payer: Self-pay | Admitting: Cardiology

## 2023-09-14 ENCOUNTER — Other Ambulatory Visit: Payer: Self-pay | Admitting: Cardiology

## 2023-09-14 NOTE — Telephone Encounter (Signed)
 Mediation sent using the following previous instructions: naproxen  sodium (ALEVE ) 220 MG tablet Medication Date: 07/10/2022 Department: [facility] [place] Ordering/Authorizing: Monetta Redell PARAS, MD   Order Providers  Prescribing Provider Encounter Provider  Monetta Redell PARAS, MD Monetta Redell PARAS, MD   Outpatient Medication Detail   Disp Refills Start End   naproxen  sodium (ALEVE ) 220 MG tablet 90 tablet 3 07/10/2022 --   Sig - Route: Take 1 tablet (220 mg total) by mouth daily. Midday - Oral   Sent to pharmacy as: naproxen  sodium (ALEVE ) 220 MG tablet   E-Prescribing Status: Receipt confirmed by pharmacy (07/10/2022 11:24 AM EST)    Pharmacy  [id number] - [place], [place] - [address]

## 2023-09-17 ENCOUNTER — Telehealth: Payer: Self-pay | Admitting: *Deleted

## 2023-09-17 DIAGNOSIS — I2510 Atherosclerotic heart disease of native coronary artery without angina pectoris: Secondary | ICD-10-CM

## 2023-09-17 DIAGNOSIS — Z01812 Encounter for preprocedural laboratory examination: Secondary | ICD-10-CM

## 2023-09-17 NOTE — Telephone Encounter (Signed)
 Labs ordered for Carpal Tunnel Surgery per [facility]

## 2023-09-18 LAB — BASIC METABOLIC PANEL
BUN/Creatinine Ratio: 13 (ref 12–28)
BUN: 13 mg/dL (ref 8–27)
CO2: 20 mmol/L (ref 20–29)
Calcium: 9.3 mg/dL (ref 8.7–10.3)
Chloride: 103 mmol/L (ref 96–106)
Creatinine, Ser: 0.98 mg/dL (ref 0.57–1.00)
Glucose: 125 mg/dL — ABNORMAL HIGH (ref 70–99)
Potassium: 4.4 mmol/L (ref 3.5–5.2)
Sodium: 138 mmol/L (ref 134–144)
eGFR: 65 mL/min/{1.73_m2} (ref >59)

## 2023-09-18 LAB — APOLIPOPROTEIN B: Apolipoprotein B: 77 mg/dL (ref <90)

## 2023-09-18 NOTE — Anesthesia Preprocedure Evaluation (Signed)
Anesthesia Evaluation  Patient identified by MRN, date of birth, ID band Patient awake    Reviewed: Allergy & Precautions, NPO status , Patient's Chart, lab work & pertinent test results  Airway Mallampati: II  TM Distance: >3 FB Neck ROM: Full    Dental no notable dental hx. (+) Upper Dentures, Dental Advisory Given   Pulmonary former smoker   Pulmonary exam normal breath sounds clear to auscultation       Cardiovascular hypertension, Pt. on medications Normal cardiovascular exam Rhythm:Regular Rate:Normal  09/12/2023 echo Left ventricular function is normal. Nuclear stress EF: 57%. The left ventricular ejection fraction is normal (55-65%). End diastolic cavity size is normal. Prior study not available for comparison.     Neuro/Psych  PSYCHIATRIC DISORDERS  Depression       GI/Hepatic ,GERD  Controlled and Medicated,,  Endo/Other    Renal/GU      Musculoskeletal  (+) Arthritis ,    Abdominal  (+) + obese (BMI 37)  Peds  Hematology   Anesthesia Other Findings   Reproductive/Obstetrics                             Anesthesia Physical Anesthesia Plan  ASA: 2  Anesthesia Plan: MAC   Post-op Pain Management:    Induction:   PONV Risk Score and Plan: Treatment may vary due to age or medical condition, Midazolam and Propofol infusion  Airway Management Planned: Natural Airway and Nasal Cannula  Additional Equipment: None  Intra-op Plan:   Post-operative Plan:   Informed Consent: I have reviewed the patients History and Physical, chart, labs and discussed the procedure including the risks, benefits and alternatives for the proposed anesthesia with the patient or authorized representative who has indicated his/her understanding and acceptance.     Dental advisory given  Plan Discussed with: CRNA and Surgeon  Anesthesia Plan Comments:        Anesthesia Quick Evaluation

## 2023-09-19 ENCOUNTER — Encounter (HOSPITAL_BASED_OUTPATIENT_CLINIC_OR_DEPARTMENT_OTHER)
Admission: RE | Disposition: A | Discharge status: Home / Self Care | Payer: Self-pay | Source: Home / Self Care | Attending: Orthopedic Surgery

## 2023-09-19 ENCOUNTER — Other Ambulatory Visit: Payer: Self-pay

## 2023-09-19 ENCOUNTER — Ambulatory Visit (HOSPITAL_BASED_OUTPATIENT_CLINIC_OR_DEPARTMENT_OTHER): Payer: Medicare HMO | Admitting: Anesthesiology

## 2023-09-19 ENCOUNTER — Ambulatory Visit (HOSPITAL_BASED_OUTPATIENT_CLINIC_OR_DEPARTMENT_OTHER)
Admission: RE | Admit: 2023-09-19 | Discharge: 2023-09-19 | Disposition: A | Discharge status: Home / Self Care | Payer: Medicare HMO | Attending: Orthopedic Surgery | Admitting: Orthopedic Surgery

## 2023-09-19 ENCOUNTER — Encounter (HOSPITAL_BASED_OUTPATIENT_CLINIC_OR_DEPARTMENT_OTHER): Payer: Self-pay | Admitting: Orthopedic Surgery

## 2023-09-19 DIAGNOSIS — G5602 Carpal tunnel syndrome, left upper limb: Secondary | ICD-10-CM | POA: Insufficient documentation

## 2023-09-19 DIAGNOSIS — F32A Depression, unspecified: Secondary | ICD-10-CM | POA: Diagnosis not present

## 2023-09-19 DIAGNOSIS — Z79899 Other long term (current) drug therapy: Secondary | ICD-10-CM | POA: Diagnosis not present

## 2023-09-19 DIAGNOSIS — Z87891 Personal history of nicotine dependence: Secondary | ICD-10-CM | POA: Diagnosis not present

## 2023-09-19 DIAGNOSIS — G5622 Lesion of ulnar nerve, left upper limb: Secondary | ICD-10-CM

## 2023-09-19 DIAGNOSIS — Z6837 Body mass index (BMI) 37.0-37.9, adult: Secondary | ICD-10-CM | POA: Diagnosis not present

## 2023-09-19 DIAGNOSIS — E669 Obesity, unspecified: Secondary | ICD-10-CM | POA: Insufficient documentation

## 2023-09-19 DIAGNOSIS — G40909 Epilepsy, unspecified, not intractable, without status epilepticus: Secondary | ICD-10-CM | POA: Diagnosis not present

## 2023-09-19 DIAGNOSIS — Z7982 Long term (current) use of aspirin: Secondary | ICD-10-CM | POA: Insufficient documentation

## 2023-09-19 DIAGNOSIS — Z01818 Encounter for other preprocedural examination: Secondary | ICD-10-CM

## 2023-09-19 DIAGNOSIS — I5022 Chronic systolic (congestive) heart failure: Secondary | ICD-10-CM | POA: Diagnosis not present

## 2023-09-19 DIAGNOSIS — K219 Gastro-esophageal reflux disease without esophagitis: Secondary | ICD-10-CM | POA: Diagnosis not present

## 2023-09-19 DIAGNOSIS — M1990 Unspecified osteoarthritis, unspecified site: Secondary | ICD-10-CM | POA: Diagnosis not present

## 2023-09-19 DIAGNOSIS — E782 Mixed hyperlipidemia: Secondary | ICD-10-CM | POA: Insufficient documentation

## 2023-09-19 DIAGNOSIS — I10 Essential (primary) hypertension: Secondary | ICD-10-CM | POA: Diagnosis not present

## 2023-09-19 HISTORY — PX: CARPAL TUNNEL RELEASE: SHX101

## 2023-09-19 HISTORY — DX: Unspecified osteoarthritis, unspecified site: M19.90

## 2023-09-19 HISTORY — PX: ULNAR TUNNEL RELEASE: SHX820

## 2023-09-19 SURGERY — CARPAL TUNNEL RELEASE
Anesthesia: Monitor Anesthesia Care | Site: Arm Lower | Laterality: Left

## 2023-09-19 MED ORDER — OXYCODONE HCL 5 MG PO TABS: 5.0000 mg | ORAL_TABLET | Freq: Four times a day (QID) | ORAL | 0 refills | Status: AC | PRN

## 2023-09-19 MED ORDER — CEFAZOLIN SODIUM-DEXTROSE 2-4 GM/100ML-% IV SOLN
2.0000 g | INTRAVENOUS | Status: AC
  Administered 2023-09-19: 2 g via INTRAVENOUS

## 2023-09-19 MED ORDER — ACETAMINOPHEN 500 MG PO TABS: 1000.0000 mg | ORAL_TABLET | Freq: Once | ORAL | Status: DC

## 2023-09-19 MED ORDER — PROPOFOL 500 MG/50ML IV EMUL
INTRAVENOUS | Status: DC | PRN
  Administered 2023-09-19: 50 ug/kg/min via INTRAVENOUS

## 2023-09-19 MED ORDER — ACETAMINOPHEN 500 MG PO TABS
ORAL_TABLET | ORAL | Status: AC
  Filled 2023-09-19: qty 2

## 2023-09-19 MED ORDER — MIDAZOLAM HCL 2 MG/2ML IJ SOLN
2.0000 mg | Freq: Once | INTRAMUSCULAR | Status: AC
  Administered 2023-09-19: 1 mg via INTRAVENOUS

## 2023-09-19 MED ORDER — MIDAZOLAM HCL 2 MG/2ML IJ SOLN
INTRAMUSCULAR | Status: AC
  Filled 2023-09-19: qty 2

## 2023-09-19 MED ORDER — ROSUVASTATIN CALCIUM 40 MG PO TABS: 40.0000 mg | ORAL_TABLET | Freq: Every day | ORAL | 3 refills | Status: DC

## 2023-09-19 MED ORDER — LACTATED RINGERS IV SOLN
INTRAVENOUS | Status: DC
Start: 2023-09-19 — End: 2023-09-19

## 2023-09-19 MED ORDER — PHENYLEPHRINE HCL (PRESSORS) 10 MG/ML IV SOLN
INTRAVENOUS | Status: DC | PRN
  Administered 2023-09-19: 160 ug via INTRAVENOUS

## 2023-09-19 MED ORDER — EPHEDRINE SULFATE (PRESSORS) 50 MG/ML IJ SOLN
INTRAMUSCULAR | Status: DC | PRN
  Administered 2023-09-19: 10 mg via INTRAVENOUS

## 2023-09-19 MED ORDER — ONDANSETRON HCL 4 MG/2ML IJ SOLN
INTRAMUSCULAR | Status: AC
Start: 2023-09-19 — End: ?
  Filled 2023-09-19: qty 2

## 2023-09-19 MED ORDER — PROPOFOL 10 MG/ML IV BOLUS
INTRAVENOUS | Status: DC | PRN
  Administered 2023-09-19: 150 mg via INTRAVENOUS

## 2023-09-19 MED ORDER — KETOROLAC TROMETHAMINE 15 MG/ML IJ SOLN: 15.0000 mg | Freq: Once | INTRAMUSCULAR | Status: DC | PRN

## 2023-09-19 MED ORDER — FENTANYL CITRATE (PF) 100 MCG/2ML IJ SOLN: 25.0000 ug | INTRAMUSCULAR | Status: DC | PRN

## 2023-09-19 MED ORDER — LIDOCAINE HCL (PF) 1 % IJ SOLN
INTRAMUSCULAR | Status: AC
Start: 2023-09-19 — End: ?
  Filled 2023-09-19: qty 30

## 2023-09-19 MED ORDER — FENTANYL CITRATE (PF) 100 MCG/2ML IJ SOLN
INTRAMUSCULAR | Status: AC
  Filled 2023-09-19: qty 2

## 2023-09-19 MED ORDER — ACETAMINOPHEN 500 MG PO TABS
1000.0000 mg | ORAL_TABLET | Freq: Once | ORAL | Status: AC
  Administered 2023-09-19: 1000 mg via ORAL

## 2023-09-19 MED ORDER — ONDANSETRON HCL 4 MG/2ML IJ SOLN
INTRAMUSCULAR | Status: DC | PRN
  Administered 2023-09-19: 4 mg via INTRAVENOUS

## 2023-09-19 MED ORDER — LACTATED RINGERS IV SOLN: INTRAVENOUS | Status: DC | PRN

## 2023-09-19 MED ORDER — CEFAZOLIN SODIUM-DEXTROSE 2-4 GM/100ML-% IV SOLN
INTRAVENOUS | Status: AC
  Filled 2023-09-19: qty 100

## 2023-09-19 MED ORDER — 0.9 % SODIUM CHLORIDE (POUR BTL) OPTIME
TOPICAL | Status: DC | PRN
Start: 2023-09-19 — End: 2023-09-19
  Administered 2023-09-19: 1000 mL

## 2023-09-19 MED ORDER — FENTANYL CITRATE (PF) 100 MCG/2ML IJ SOLN
100.0000 ug | Freq: Once | INTRAMUSCULAR | Status: AC
  Administered 2023-09-19: 50 ug via INTRAVENOUS

## 2023-09-19 MED ORDER — BUPIVACAINE HCL (PF) 0.5 % IJ SOLN
INTRAMUSCULAR | Status: DC | PRN
  Administered 2023-09-19: 30 mL via PERINEURAL

## 2023-09-19 MED ORDER — PROPOFOL 10 MG/ML IV BOLUS
INTRAVENOUS | Status: AC
Start: 2023-09-19 — End: ?
  Filled 2023-09-19: qty 20

## 2023-09-19 SURGICAL SUPPLY — 43 items
BLADE SURG 15 STRL LF DISP TIS (BLADE) ×1 IMPLANT
BNDG ELASTIC 3INX 5YD STR LF (GAUZE/BANDAGES/DRESSINGS) ×1 IMPLANT
BNDG ELASTIC 6INX 5YD STR LF (GAUZE/BANDAGES/DRESSINGS) IMPLANT
BNDG ESMARK 4X9 LF (GAUZE/BANDAGES/DRESSINGS) ×1 IMPLANT
BNDG GAUZE DERMACEA FLUFF 4 (GAUZE/BANDAGES/DRESSINGS) ×1 IMPLANT
BNDG PLASTER X FAST 3X3 WHT LF (CAST SUPPLIES) IMPLANT
CHLORAPREP W/TINT 26 (MISCELLANEOUS) ×1 IMPLANT
CORD BIPOLAR FORCEPS 12FT (ELECTRODE) ×1 IMPLANT
COVER BACK TABLE 60X90IN (DRAPES) ×1 IMPLANT
CUFF TOURN SGL QUICK 18X4 (TOURNIQUET CUFF) ×1 IMPLANT
CUFF TRNQT CYL 24X4X16.5-23 (TOURNIQUET CUFF) IMPLANT
DERMABOND ADVANCED .7 DNX12 (GAUZE/BANDAGES/DRESSINGS) IMPLANT
DRAPE EXTREMITY T 121X128X90 (DISPOSABLE) ×1 IMPLANT
DRAPE IMP U-DRAPE 54X76 (DRAPES) ×1 IMPLANT
DRAPE SURG 17X23 STRL (DRAPES) ×1 IMPLANT
GAUZE SPONGE 4X4 12PLY STRL (GAUZE/BANDAGES/DRESSINGS) ×1 IMPLANT
GAUZE XEROFORM 1X8 LF (GAUZE/BANDAGES/DRESSINGS) ×1 IMPLANT
GLOVE BIO SURGEON STRL SZ7 (GLOVE) ×1 IMPLANT
GLOVE BIOGEL PI IND STRL 7.0 (GLOVE) ×1 IMPLANT
GOWN STRL REUS W/ TWL LRG LVL3 (GOWN DISPOSABLE) ×2 IMPLANT
NDL HYPO 25X1 1.5 SAFETY (NEEDLE) ×1 IMPLANT
NEEDLE HYPO 25X1 1.5 SAFETY (NEEDLE) IMPLANT
NS IRRIG 1000ML POUR BTL (IV SOLUTION) ×1 IMPLANT
PACK BASIN DAY SURGERY FS (CUSTOM PROCEDURE TRAY) ×1 IMPLANT
PAD CAST 3X4 CTTN HI CHSV (CAST SUPPLIES) ×1 IMPLANT
SHEET MEDIUM DRAPE 40X70 STRL (DRAPES) ×1 IMPLANT
SLEEVE SCD COMPRESS KNEE MED (STOCKING) IMPLANT
SLING ARM FOAM STRAP LRG (SOFTGOODS) IMPLANT
STRIP CLOSURE SKIN 1/2X4 (GAUZE/BANDAGES/DRESSINGS) IMPLANT
SUCTION TUBE FRAZIER 10FR DISP (SUCTIONS) IMPLANT
SUT ETHIBOND 3-0 V-5 (SUTURE) IMPLANT
SUT ETHILON 3 0 PS 1 (SUTURE) ×1 IMPLANT
SUT ETHILON 4 0 PS 2 18 (SUTURE) ×1 IMPLANT
SUT MNCRL AB 3-0 PS2 18 (SUTURE) IMPLANT
SUT MNCRL AB 4-0 PS2 18 (SUTURE) IMPLANT
SUT VIC AB 4-0 PS2 18 (SUTURE) IMPLANT
SUT VICRYL 0 SH 27 (SUTURE) IMPLANT
SYR 10ML LL (SYRINGE) IMPLANT
SYR BULB EAR ULCER 3OZ GRN STR (SYRINGE) ×1 IMPLANT
SYR CONTROL 10ML LL (SYRINGE) ×1 IMPLANT
TOWEL GREEN STERILE FF (TOWEL DISPOSABLE) ×2 IMPLANT
TUBE CONNECTING 20X1/4 (TUBING) IMPLANT
UNDERPAD 30X36 HEAVY ABSORB (UNDERPADS AND DIAPERS) ×1 IMPLANT

## 2023-09-19 NOTE — Progress Notes (Signed)
Assisted Dr. Richardson Landry with left, supraclavicular, ultrasound guided block. Side rails up, monitors on throughout procedure. See vital signs in flow sheet. Tolerated Procedure well.

## 2023-09-19 NOTE — Transfer of Care (Signed)
Immediate Anesthesia Transfer of Care Note  Patient: Teresa [name]  Procedure(s) Performed: CARPAL TUNNEL RELEASE (Left: Arm Lower) CUBITAL TUNNEL RELEASE (Left: Arm Lower)  Patient Location: PACU  Anesthesia Type:General  Level of Consciousness: awake, alert , and oriented  Airway & Oxygen Therapy: Patient Spontanous Breathing and Patient connected to face mask oxygen  Post-op Assessment: Report given to RN and Post -op Vital signs reviewed and stable  Post vital signs: Reviewed and stable  Last Vitals:  Vitals Value Taken Time  BP 146/67 09/19/23 0945  Temp    Pulse 78 09/19/23 0945  Resp 15 09/19/23 0945  SpO2 99 % 09/19/23 0945  Vitals shown include unfiled device data.  Last Pain:  Vitals:   09/19/23 0726  TempSrc: Oral  PainSc: 2       Patients Stated Pain Goal: 8 (09/19/23 0726)  Complications: No notable events documented.

## 2023-09-19 NOTE — Anesthesia Postprocedure Evaluation (Signed)
Anesthesia Post Note  Patient: [name]  Procedure(s) Performed: CARPAL TUNNEL RELEASE (Left: Arm Lower) CUBITAL TUNNEL RELEASE (Left: Arm Lower)     Patient location during evaluation: PACU Anesthesia Type: General Level of consciousness: awake and alert Pain management: pain level controlled Vital Signs Assessment: post-procedure vital signs reviewed and stable Respiratory status: spontaneous breathing, nonlabored ventilation, respiratory function stable and patient connected to nasal cannula oxygen Cardiovascular status: blood pressure returned to baseline and stable Postop Assessment: no apparent nausea or vomiting Anesthetic complications: no  No notable events documented.  Last Vitals:  Vitals:   09/19/23 0945 09/19/23 1017  BP: (!) 146/67 (!) 150/84  Pulse: 81 78  Resp: 18 20  Temp:  (!) 36.1 C  SpO2: 98% 93%    Last Pain:  Vitals:   09/19/23 1017  TempSrc: Temporal  PainSc: 0-No pain                 [name]

## 2023-09-19 NOTE — H&P (Signed)
HAND SURGERY   HPI: Patient is a 63 y.o. 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  Patient denies any changes to their medical history or new systemic symptoms today.    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   Cardiogenic shock (HCC)    Carpal tunnel syndrome of left wrist 05/07/2023   Coronary artery disease 08/13/2020   Coronary artery disease involving native coronary artery of native heart    Coronary artery disease involving native coronary artery of native heart without angina pectoris 09/17/2020   Depressed left ventricular ejection fraction 08/13/2020   Depression, unspecified 10/09/2015   Essential hypertension 10/06/2015   Gastro-esophageal reflux disease without esophagitis 10/06/2015   HFrEF (heart failure with reduced ejection fraction) (HCC) 08/13/2020   History of tonic-clonic seizures 04/25/2020   Hyperlipidemia 10/06/2015   Hypertensive heart disease 10/06/2015   Ischemic cardiomyopathy 08/13/2020   Mixed hyperlipidemia 09/17/2020   Pericardial effusion 08/13/2020   Post operative atrial fibrillation (HCC) 08/13/2020   Seizure disorder (HCC) 03/03/2016   Shortness of breath 04/25/2020   Syncope 04/25/2020   Tobacco use 08/13/2020   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   Past Surgical History:  Procedure Laterality Date   AORTIC VALVE REPLACEMENT (AVR)/CORONARY ARTERY BYPASS GRAFTING (CABG)  04/26/2020   X4   APPENDECTOMY     IR 3D INDEPENDENT WKST  07/05/2021   IR 3D INDEPENDENT WKST  08/31/2021   IR ANGIO INTRA EXTRACRAN SEL COM CAROTID INNOMINATE BILAT MOD SED  07/05/2021   IR ANGIO INTRA EXTRACRAN SEL  INTERNAL CAROTID UNI R MOD SED  08/31/2021   IR ANGIO VERTEBRAL SEL SUBCLAVIAN INNOMINATE BILAT MOD SED  07/05/2021   IR ANGIOGRAM FOLLOW UP STUDY  08/31/2021   IR RADIOLOGIST EVAL & MGMT  06/16/2021   IR RADIOLOGIST EVAL & MGMT  09/16/2021   IR TRANSCATH/EMBOLIZ  08/31/2021   RADIOLOGY WITH ANESTHESIA N/A 08/31/2021   Procedure: IR WITH ANESTHESIA EMBOLIZATION;  Surgeon: Julieanne Cotton, MD;  Location: [facility] OR;  Service: Radiology;  Laterality: N/A;   TONSILLECTOMY     TOTAL ABDOMINAL HYSTERECTOMY     TOTAL HIP ARTHROPLASTY     Social History   Socioeconomic History   Marital status: Married    Spouse name: Not on file   Number of children: Not on file   Years of education: Not on file   Highest education level: Not on file  Occupational History   Not on file  Tobacco Use   Smoking status: Former    Current packs/day: 0.00    Types: Cigarettes    Quit date: 04/07/2020    Years since quitting: 3.4   Smokeless tobacco: Never  Vaping Use   Vaping status: Never Used  Substance and Sexual Activity   Alcohol use: Never   Drug use: Never   Sexual activity: Not Currently    Birth control/protection: Surgical    Comment: hyst  Other Topics Concern   Not on file  Social History Narrative   Right handed    Lives with sister    Social Drivers of Health   Financial Resource Strain: Not on file  Food Insecurity:  Not on file  Transportation Needs: Not on file  Physical Activity: Not on file  Stress: Not on file  Social Connections: Not on file   Family History  Problem Relation Age of Onset   Hypertension Mother    Hypertension Father    Hypertension Sister    Diabetes Mellitus I Sister    Hypertension Brother    Diabetes Paternal Grandmother    - negative except otherwise stated in the family history section No Known Allergies Prior to Admission medications   Medication Sig Start Date End Date Taking? Authorizing Provider  acetaminophen (TYLENOL) 500 MG tablet Take 1,000  mg by mouth every 6 (six) hours as needed for moderate pain or mild pain.   Yes [provider]  albuterol (VENTOLIN HFA) 108 (90 Base) MCG/ACT inhaler Inhale 1 puff into the lungs every 6 (six) hours as needed for wheezing or shortness of breath. 09/20/15  Yes [provider]  aspirin EC 81 MG tablet Take 81 mg by mouth daily. Swallow whole.   Yes [provider]  atorvastatin (LIPITOR) 40 MG tablet Take 1 tablet (40 mg total) by mouth daily. Call [phone] to make an appointment for further refills. 09/06/21  Yes Tobb, Kardie, DO  Cholecalciferol (VITAMIN D) 50 MCG (2000 UT) tablet Take 2,000 Units by mouth daily.   Yes [provider]  citalopram (CELEXA) 20 MG tablet Take 20 mg by mouth daily. 04/01/20  Yes [provider]  ezetimibe (ZETIA) 10 MG tablet TAKE 1 TABLET BY MOUTH EVERY DAY 03/19/23  Yes Baldo Daub, MD  ferrous sulfate 324 MG TBEC Take 324 mg by mouth daily with breakfast.   Yes [provider]  furosemide (LASIX) 20 MG tablet Take 20 mg by mouth every other day.   Yes [provider]  KLOR-CON M20 20 MEQ tablet Take 20 mEq by mouth daily. 03/20/23  Yes [provider]  levETIRAcetam (KEPPRA XR) 500 MG 24 hr tablet Take 2 tablets in AM, 1 tablet in PM 04/13/23  Yes Van Clines, MD  meloxicam (MOBIC) 7.5 MG tablet Take 7.5 mg by mouth 2 (two) times daily as needed for pain. 02/28/21  Yes [provider]  metoprolol succinate (TOPROL-XL) 25 MG 24 hr tablet Take 0.5 tablets (12.5 mg total) by mouth daily. 09/12/23  Yes Baldo Daub, MD  Omega-3 Fatty Acids (FISH OIL) 1000 MG CAPS Take 1,000 mg by mouth daily.   Yes [provider]  pantoprazole (PROTONIX) 40 MG tablet Take 40 mg by mouth daily. 04/15/20  Yes [provider]  traMADol (ULTRAM) 50 MG tablet Take 50 mg by mouth 3 (three) times daily as needed for pain. 11/26/20  Yes [provider]  zolpidem (AMBIEN) 10 MG tablet  Take 10 mg by mouth at bedtime. 10/26/20  Yes [provider]  [employer] NAPROXEN SODIUM 220 MG tablet TAKE 1 TABLET (220 MG TOTAL) BY MOUTH DAILY. MIDDAY 09/14/23   Baldo Daub, MD   No results found. - Positive ROS: All other systems have been reviewed and were otherwise negative with the exception of those mentioned in the HPI and as above.  Physical Exam: General: No acute distress, resting comfortably Cardiovascular: BUE warm and well perfused, normal rate Respiratory: Normal WOB on RA Skin: Warm and dry Neurologic: Sensation intact distally Psychiatric: Patient is at baseline mood and affect  Left Upper Extremity  Negative Tinel at the wrist with positive Phalen and Durkan signs.  Positive Tinel at  the medial elbow.  No ulnar nerve instability.  4/5 thenar motor strength but without obvious atrophy.  Hand warm and well perfused w/ BCR.    Assessment: 63 yo F w/ recurrent left carpal tunnel syndrome and primary cubital tunnel syndrome both of which have failed conservative management.   Plan: OR today for revision left carpal tunnel release and cubital tunnel release. We again reviewed the risks of surgery which include bleeding, infection, damage to the median or ulnar nerves or their branches, persistent symptoms, sensitivity around the incisions, delayed wound healing, need for additional surgery.    Marlyne Beards, M.D. [facility] 7:36 AM

## 2023-09-19 NOTE — Anesthesia Procedure Notes (Signed)
Procedure Name: MAC Date/Time: 09/19/2023 9:04 AM  Performed by: Karen Kitchens, CRNAPre-anesthesia Checklist: Emergency Drugs available, Patient identified, Suction available, Patient being monitored and Timeout performed Patient Re-evaluated:Patient Re-evaluated prior to induction Oxygen Delivery Method: Simple face mask Placement Confirmation: positive ETCO2, CO2 detector and breath sounds checked- equal and bilateral Comments: Light sedation

## 2023-09-19 NOTE — Interval H&P Note (Signed)
History and Physical Interval Note:  09/19/2023 8:26 AM  Teresa [name]  has presented today for surgery, with the diagnosis of Left carpal and cubital tunnel syndrome.  The various methods of treatment have been discussed with the patient and family. After consideration of risks, benefits and other options for treatment, the patient has consented to  Procedure(s) with comments: CARPAL TUNNEL RELEASE (Left) - regional CUBITAL TUNNEL RELEASE (Left) - regional as a surgical intervention.  The patient's history has been reviewed, patient examined, no change in status, stable for surgery.  I have reviewed the patient's chart and labs.  Questions were answered to the patient's satisfaction.     Rayman Petrosian

## 2023-09-19 NOTE — Discharge Instructions (Addendum)
Post Anesthesia Home Care Instructions  Activity: Get plenty of rest for the remainder of the day. A responsible individual must stay with you for 24 hours following the procedure.  For the next 24 hours, DO NOT: -Drive a car -Advertising copywriter -Drink alcoholic beverages -Take any medication unless instructed by your physician -Make any legal decisions or sign important papers.  Meals: Start with liquid foods such as gelatin or soup. Progress to regular foods as tolerated. Avoid greasy, spicy, heavy foods. If nausea and/or vomiting occur, drink only clear liquids until the nausea and/or vomiting subsides. Call your physician if vomiting continues.  Special Instructions/Symptoms: Your throat may feel dry or sore from the anesthesia or the breathing tube placed in your throat during surgery. If this causes discomfort, gargle with warm salt water. The discomfort should disappear within 24 hours.  If you had a scopolamine patch placed behind your ear for the management of post- operative nausea and/or vomiting:  1. The medication in the patch is effective for 72 hours, after which it should be removed.  Wrap patch in a tissue and discard in the trash. Wash hands thoroughly with soap and water. 2. You may remove the patch earlier than 72 hours if you experience unpleasant side effects which may include dry mouth, dizziness or visual disturbances. 3. Avoid touching the patch. Wash your hands with soap and water after contact with the patch.       Waylan Rocher, M.D. Hand Surgery  POST-OPERATIVE DISCHARGE INSTRUCTIONS   PRESCRIPTIONS: You may have been given a prescription to be taken as directed for post-operative pain control.  You may also take over the counter ibuprofen/aleve and tylenol for pain. Take this as directed on the packaging. Do not exceed 3000 mg tylenol/acetaminophen in 24 hours.  Ibuprofen 600-800 mg (3-4) tablets by mouth every 6 hours as needed for pain.   OR Aleve 2 tablets by mouth every 12 hours (twice daily) as needed for pain.  AND/OR Tylenol 1000 mg (2 tablets) every 8 hours as needed for pain.  Please use your pain medication carefully, as refills are limited and you may not be provided with one.  As stated above, please use over the counter pain medicine - it will also be helpful with decreasing your swelling.    ANESTHESIA: After your surgery, post-surgical discomfort or pain is likely. This discomfort can last several days to a few weeks. At certain times of the day your discomfort may be more intense.   Did you receive a nerve block?  A nerve block can provide pain relief for one hour to two days after your surgery. As long as the nerve block is working, you will experience little or no sensation in the area the surgeon operated on.  As the nerve block wears off, you will begin to experience pain or discomfort. It is very important that you begin taking your prescribed pain medication before the nerve block fully wears off. Treating your pain at the first sign of the block wearing off will ensure your pain is better controlled and more tolerable when full-sensation returns. Do not wait until the pain is intolerable, as the medicine will be less effective. It is better to treat pain in advance than to try and catch up.   General Anesthesia:  If you did not receive a nerve block during your surgery, you will need to start taking your pain medication shortly after your surgery and should continue to do so as prescribed by your  Careers adviser.     ICE AND ELEVATION: You may use ice for the first 48-72 hours, but it is not critical.   Motion of your fingers is very important to decrease the swelling.  Elevation, as much as possible for the next 48 hours, is critical for decreasing swelling as well as for pain relief. Elevation means when you are seated or lying down, you hand should be at or above your heart. When walking, the hand needs to be  at or above the level of your elbow.  If the bandage gets too tight, it may need to be loosened. Please contact our office and we will instruct you in how to do this.    SURGICAL BANDAGES:  Keep your dressing and/or splint clean and dry at all times.  Do not remove until you are seen again in the office.  If careful, you may place a plastic bag over your bandage and tape the end to shower, but be careful, do not get your bandages wet.     HAND THERAPY:  You may not need any. If you do, we will begin this at your follow up visit in the clinic.    ACTIVITY AND WORK: You are encouraged to move any fingers which are not in the bandage.  Light use of the fingers is allowed to assist the other hand with daily hygiene and eating, but strong gripping or lifting is often uncomfortable and should be avoided.  You might miss a variable period of time from work and hopefully this issue has been discussed prior to surgery. You may not do any heavy work with your affected hand for about 2 weeks.    [facility] Second Floor, [address] [place], [place] [address] [phone]   Post Anesthesia Home Care Instructions  Activity: Get plenty of rest for the remainder of the day. A responsible individual must stay with you for 24 hours following the procedure.  For the next 24 hours, DO NOT: -Drive a car -Advertising copywriter -Drink alcoholic beverages -Take any medication unless instructed by your physician -Make any legal decisions or sign important papers.  Meals: Start with liquid foods such as gelatin or soup. Progress to regular foods as tolerated. Avoid greasy, spicy, heavy foods. If nausea and/or vomiting occur, drink only clear liquids until the nausea and/or vomiting subsides. Call your physician if vomiting continues.  Special Instructions/Symptoms: Your throat may feel dry or sore from the anesthesia or the breathing tube placed in your throat during surgery. If this causes  discomfort, gargle with warm salt water. The discomfort should disappear within 24 hours.  If you had a scopolamine patch placed behind your ear for the management of post- operative nausea and/or vomiting:  1. The medication in the patch is effective for 72 hours, after which it should be removed.  Wrap patch in a tissue and discard in the trash. Wash hands thoroughly with soap and water. 2. You may remove the patch earlier than 72 hours if you experience unpleasant side effects which may include dry mouth, dizziness or visual disturbances. 3. Avoid touching the patch. Wash your hands with soap and water after contact with the patch.     Regional Anesthesia Blocks  1. You may not be able to move or feel the "blocked" extremity after a regional anesthetic block. This may last may last from 3-48 hours after placement, but it will go away. The length of time depends on the medication injected and your individual  response to the medication. As the nerves start to wake up, you may experience tingling as the movement and feeling returns to your extremity. If the numbness and inability to move your extremity has not gone away after 48 hours, please call your surgeon.   2. The extremity that is blocked will need to be protected until the numbness is gone and the strength has returned. Because you cannot feel it, you will need to take extra care to avoid injury. Because it may be weak, you may have difficulty moving it or using it. You may not know what position it is in without looking at it while the block is in effect.  3. For blocks in the legs and feet, returning to weight bearing and walking needs to be done carefully. You will need to wait until the numbness is entirely gone and the strength has returned. You should be able to move your leg and foot normally before you try and bear weight or walk. You will need someone to be with you when you first try to ensure you do not fall and possibly risk  injury.  4. Bruising and tenderness at the needle site are common side effects and will resolve in a few days.  5. Persistent numbness or new problems with movement should be communicated to the surgeon or the [facility] [phone] [facility] [phone]).  No tylenol until after 1:30pm today if needed.

## 2023-09-19 NOTE — Anesthesia Procedure Notes (Signed)
Anesthesia Regional Block: Supraclavicular block   Pre-Anesthetic Checklist: , timeout performed,  Correct Patient, Correct Site, Correct Laterality,  Correct Procedure, Correct Position, site marked,  Risks and benefits discussed,  Surgical consent,  Pre-op evaluation,  At surgeon's request and post-op pain management  Laterality: Upper and Left  Prep: chloraprep       Needles:  Injection technique: Single-shot  Needle Type: Echogenic Needle     Needle Length: 5cm  Needle Gauge: 21     Additional Needles:   Procedures:,,,, ultrasound used (permanent image in chart),,     Nerve Stimulator or Paresthesia:   Additional Responses:  Block tested.  Patient tolerated procedure well Narrative:  Start time: 09/19/2023 8:09 AM End time: 09/19/2023 8:15 AM Injection made incrementally with aspirations every 5 mL.  Performed by: Personally  Anesthesiologist: [name], MD  Additional Notes: Block tested. Patient tolerated procedure well.

## 2023-09-19 NOTE — Op Note (Signed)
Date of Surgery: 09/19/2023  INDICATIONS: Patient is a 63 y.o.-year-old female with left carpal and cubital tunnel syndromes that were confirmed with electrodiagnostic studies and have failed conservative management.  Risks, benefits, and alternatives to surgery were again discussed with the patient in the preoperative area. The patient wishes to proceed with surgery.  Informed consent was signed after our discussion.   PREOPERATIVE DIAGNOSIS:  Left carpal tunnel syndrome Left cubital tunnel syndrome  POSTOPERATIVE DIAGNOSIS: Same.  PROCEDURE:  Left carpal tunnel release Left in-situ cubital tunnel release   SURGEON: Waylan Rocher, M.D.  ASSIST: None  ANESTHESIA:  general, regional  IV FLUIDS AND URINE: See anesthesia.  ESTIMATED BLOOD LOSS: 10 mL.  IMPLANTS: * No implants in log *   DRAINS: None  COMPLICATIONS: None  DESCRIPTION OF PROCEDURE: The patient was met in the preoperative holding area where the surgical site was marked and the consent form was signed.  The patient was then taken to the operating room and remained on the strether.  All bony prominences were well padded.  A hand table was placed adjacent to the left upper extremity and locked into place. A tourniquet was applied high on the left upper arm.  General endotracheal anesthesia was induced.  The operative extremity was prepped and draped in the usual and sterile fashion.  A formal time-out was performed to confirm that this was the correct patient, surgery, side, and site.    Following timeout, the limb was exsanguinated and the tourniquet inflated to 250 mmHg.  I began with the carpal tunnel release.  A longitudinal incision was made in line with the radial border of the ring finger from distal to the wrist flexion crease to the intersection of Kaplan's cardinal line.  The skin and subcutaneous tissue was sharply divided.  The longitudinally running palmar fascia was incised.  The thenar musculature was  bluntly swept off of the transverse carpal ligament.  The ligament was divided from proximal to distal until the fat surrounding the palmar arch was encountered. Retractors were then placed in the proximal aspect of the wound to visualize the distal antebrachial fascia.  The fascia was sharply divided under direct visualization.   The wound was then thoroughly irrigated with sterile saline.  The wound was then closed with 4-0 nylon sutures in a horizontal mattress fashion.  I then turned my attention to the elbow.  A longitudinal incision was made at the posterior aspect of the medial epicondyle extending both proximally and distally in line with the ulnar nerve.  The skin was incised.  Blunt dissection was used through the adipose tissue.  Small crossing vessels were coagulated with bipolar cautery.  Further blunt dissection was used to identify the ulnar nerve just posterior and proximal to the medial epicondyle.  The ulnar nerve was decompressed distally.  Osborne's ligament was released.  The fascia overlying the flexor carpi ulnaris was sharply divided. The humeral and ulnar heads of the FCU muscle belly were identified.  Blunt dissection was used to divide the muscle bellies to further decompress the ulnar nerve.  The nerve was then decompressed proximally.  The fascia overlying the ulnar nerve was divided with care taken to protect the adjacent venous plexus.  The nerve was decompressed proximally to the level of the arcade of Struthers.  With the nerve fully decompressed, passive flexion and extension of the elbow was performed without evidence of subluxation or instability.  The wound was then thoroughly irrigated with copious sterile saline.  The  tourniquet was deflated.  Hemostasis was achieved with both direct pressure over the wound and bipolar electrocautery.  The wound was then closed in layered fashion using a 3-0 Monocryl in a buried interrupted fashion followed by 4-0 nylon suture in horizontal  mattress fashion.  Both wounds were cleaned and dressed with xeroform, folded kerlix, cast padding, and an ace wrap.   The patient was reversed from sedation and extubated uneventfully.  All counts were correct x 2 at the end of the procedure.  The patient was then taken to the PACU in stable condition.   POSTOPERATIVE PLAN: She will be discharged to home with appropriate pain medication and discharge instructions.  I'll see her back in the office in 10-14 days for her first postop visit.   Waylan Rocher, MD 9:52 AM

## 2023-09-19 NOTE — Anesthesia Procedure Notes (Signed)
Procedure Name: LMA Insertion Date/Time: 09/19/2023 9:10 AM  Performed by: Karen Kitchens, CRNAPre-anesthesia Checklist: Patient identified, Emergency Drugs available, Suction available and Patient being monitored Patient Re-evaluated:Patient Re-evaluated prior to induction Oxygen Delivery Method: Circle system utilized Preoxygenation: Pre-oxygenation with 100% oxygen Induction Type: IV induction Ventilation: Mask ventilation without difficulty LMA: LMA inserted LMA Size: 4.0 Number of attempts: 1 Airway Equipment and Method: Bite block Placement Confirmation: positive ETCO2 Tube secured with: Tape Dental Injury: Teeth and Oropharynx as per pre-operative assessment  Comments: Pt had some pain with the cubital tunnel incision, lma inserted and TIVA.

## 2023-09-20 ENCOUNTER — Encounter (HOSPITAL_BASED_OUTPATIENT_CLINIC_OR_DEPARTMENT_OTHER): Payer: Self-pay | Admitting: Orthopedic Surgery

## 2023-09-28 ENCOUNTER — Other Ambulatory Visit: Payer: Medicare HMO

## 2023-10-09 ENCOUNTER — Other Ambulatory Visit: Payer: Self-pay | Admitting: Cardiology

## 2023-10-16 ENCOUNTER — Ambulatory Visit: Payer: Medicare HMO | Attending: Cardiology

## 2023-10-16 DIAGNOSIS — Z951 Presence of aortocoronary bypass graft: Secondary | ICD-10-CM | POA: Diagnosis not present

## 2023-10-16 DIAGNOSIS — I2510 Atherosclerotic heart disease of native coronary artery without angina pectoris: Secondary | ICD-10-CM

## 2023-10-16 DIAGNOSIS — I110 Hypertensive heart disease with heart failure: Secondary | ICD-10-CM

## 2023-10-16 DIAGNOSIS — I5042 Chronic combined systolic (congestive) and diastolic (congestive) heart failure: Secondary | ICD-10-CM

## 2023-10-16 DIAGNOSIS — I255 Ischemic cardiomyopathy: Secondary | ICD-10-CM | POA: Diagnosis not present

## 2023-10-16 DIAGNOSIS — E785 Hyperlipidemia, unspecified: Secondary | ICD-10-CM

## 2023-10-16 LAB — ECHOCARDIOGRAM COMPLETE
Area-P 1/2: 4.13 cm2
Calc EF: 50.2 %
P 1/2 time: 619 ms
S' Lateral: 4.5 cm
Single Plane A2C EF: 50 %
Single Plane A4C EF: 47.3 %

## 2023-10-26 DIAGNOSIS — I5020 Unspecified systolic (congestive) heart failure: Secondary | ICD-10-CM

## 2023-10-29 ENCOUNTER — Telehealth: Payer: Self-pay | Admitting: Cardiology

## 2023-10-29 ENCOUNTER — Other Ambulatory Visit: Payer: Self-pay

## 2023-10-29 MED ORDER — SPIRONOLACTONE 25 MG PO TABS: 12.5000 mg | ORAL_TABLET | Freq: Every day | ORAL | 3 refills | Status: AC

## 2023-10-29 MED ORDER — FARXIGA 10 MG PO TABS: 10.0000 mg | ORAL_TABLET | Freq: Every day | ORAL | Status: DC

## 2023-10-29 MED ORDER — VALSARTAN 40 MG PO TABS: 40.0000 mg | ORAL_TABLET | Freq: Two times a day (BID) | ORAL | 0 refills | Status: DC

## 2023-10-29 MED ORDER — FARXIGA 10 MG PO TABS: 10.0000 mg | ORAL_TABLET | Freq: Every day | ORAL | 3 refills | Status: AC

## 2023-10-29 NOTE — Telephone Encounter (Signed)
 Left VM to call back

## 2023-10-29 NOTE — Telephone Encounter (Signed)
 Spoke with pt and reviewed results and recommendations as per Dr. Hulen Shouts result note. Pt verbalized understanding and had no additional questions.

## 2023-10-29 NOTE — Telephone Encounter (Signed)
 Patient calling in with question concerning her mychart message. Please advise

## 2023-10-30 ENCOUNTER — Other Ambulatory Visit: Payer: Self-pay

## 2023-11-02 ENCOUNTER — Ambulatory Visit: Payer: 59 | Admitting: Neurology

## 2023-11-20 ENCOUNTER — Other Ambulatory Visit: Payer: Self-pay | Admitting: Cardiology

## 2023-11-20 ENCOUNTER — Telehealth: Payer: Self-pay

## 2023-11-20 DIAGNOSIS — N289 Disorder of kidney and ureter, unspecified: Secondary | ICD-10-CM

## 2023-11-20 DIAGNOSIS — I5020 Unspecified systolic (congestive) heart failure: Secondary | ICD-10-CM

## 2023-11-20 LAB — BASIC METABOLIC PANEL WITH GFR
BUN/Creatinine Ratio: 13 (ref 12–28)
BUN: 18 mg/dL (ref 8–27)
CO2: 21 mmol/L (ref 20–29)
Calcium: 9.8 mg/dL (ref 8.7–10.3)
Chloride: 101 mmol/L (ref 96–106)
Creatinine, Ser: 1.41 mg/dL — ABNORMAL HIGH (ref 0.57–1.00)
Glucose: 104 mg/dL — ABNORMAL HIGH (ref 70–99)
Potassium: 4.7 mmol/L (ref 3.5–5.2)
Sodium: 138 mmol/L (ref 134–144)
eGFR: 42 mL/min/{1.73_m2} — ABNORMAL LOW (ref >59)

## 2023-11-20 NOTE — Telephone Encounter (Signed)
Left vm and MyChart message

## 2023-11-20 NOTE — Telephone Encounter (Signed)
-----   Message from Micael Adas Revankar sent at 11/20/2023  9:43 AM EDT ----- Renal insufficiency.  Recheck in 1 month copy primary care Nelia Balzarine, MD 11/20/2023 9:42 AM

## 2023-11-21 NOTE — Telephone Encounter (Signed)
 Pt returning call to a nurse

## 2023-11-22 NOTE — Telephone Encounter (Signed)
 Called and spoke to patient. Reviewed lab results as per Dr. Barclay Leyden note. Patient verbalized understanding and had no further questions.

## 2023-11-22 NOTE — Telephone Encounter (Signed)
 Pt returning call, requesting cb

## 2023-11-27 ENCOUNTER — Other Ambulatory Visit: Payer: Self-pay

## 2023-11-27 ENCOUNTER — Telehealth: Payer: Self-pay | Admitting: Cardiology

## 2023-11-27 NOTE — Telephone Encounter (Signed)
 BP log looks good. How is she feeling?  Dr. Sandee Crook wanted to get her on Entresto.  Need to repeat BMET, and if this is ok, then we will transition her to Entresto.

## 2023-11-27 NOTE — Telephone Encounter (Signed)
 Called the patient and informed her of the results of her blood pressure log. Patient is still feeling tired and at times will become dizzy. She had a BMP drawn on 11/19/23.

## 2023-11-27 NOTE — Telephone Encounter (Signed)
 Called patient and informed her of Pattricia Bores NP's recommendation below:  "I think we need to see her in the office.  Her kidney function is slightly elevated, this is not atypical but I need to see if we need to stop her lasix ".   An appointment was scheduled for her to see Pattricia Bores NP on 12/03/23 at 2:45 pm. Patient verbalized understanding and had no further questions at this time.

## 2023-11-30 NOTE — Progress Notes (Signed)
 Cardiology Office Note:  .   Date:  12/03/2023  ID:  Teresa [name], DOB Nov 23, 1960, MRN [id number] PCP: Angelique Barer, MD  [facility] Providers Cardiologist:  Zoe Hinds, MD    History of Present Illness: .   Teresa [name] is a 63 y.o. 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  10/16/2023 echo EF 35 to 40%, global hypokinesis, mild MR >> recommendations to start valsartan  with plans to transition to [facility] 08/02/2023 Lexiscan  normal, low risk study, EF 57% 07/11/2022 echo EF 45 to 50%, grade 1 DD, mild MR, mild AR 12/27/2020 monitor average heart rate 60 bpm, rare PACs rare PVCs  09/10/2020 echo EF 40 to 45%, no valvular abnormalities 08/13/2020 monitor predominantly sinus rhythm, average heart rate 67 bpm, unremarkable study 04/25/2020 non-STEMI >> CABG  x 4 >> atrial fibrillation surrounding perioperative time 07/08/2020 echo EF 45 to 50%, grade 2 DD  Evaluated by Dr. Sandee Crook on 07/10/2022, she was well from a cardiac perspective, echocardiogram was arranged for evaluation of cardiomyopathy, she advised to follow-up in 1 year.  Evaluated by myself on 07/26/2023 for preoperative exam, we arranged a Lexiscan  which was overall normal, EF 57%.  She saw Dr. Sandee Crook on 09/10/2023, repeat echocardiogram was arranged which revealed an EF of 35 to 40%, GDMT was initiated including Farxiga , valsartan  twice daily and spironolactone .  She was asked to keep a blood pressure log with hopes of being able to transition to her Entresto.  Her blood pressure log appeared to reveal low normal results so we contacted her to see how she was feeling, she had complaints of dizziness and was brought in for follow-up appointment.  Presents today accompanied by her sister for follow-up as outlined above.  Her blood pressure is low normal at 96/68 and she is symptomatic.  She says she has felt  relatively terrible since she was started on her medications approximately 6 weeks ago.  She endorses dizziness, as well as a few falls.  She is staying well-hydrated.  She does have episodes of chest pain however she states they typically last for a few seconds and are not bothersome for her, do not sound to be consistent with angina.  She also endorses palpitations but again these are very brief as well. She denies chest pain, palpitations, dyspnea, pnd, orthopnea, n, v,, syncope, edema, weight gain, or early satiety.    ROS: Review of Systems  Musculoskeletal:  Positive for joint pain.  Neurological:  Positive for dizziness.  All other systems reviewed and are negative.    Studies Reviewed: .        Cardiac Studies & Procedures   ______________________________________________________________________________________________   STRESS TESTS  MYOCARDIAL PERFUSION IMAGING 08/02/2023  Narrative   Findings are consistent with no ischemia and no infarction. The study is low risk.   No ST deviation was noted.   Left ventricular function is normal. Nuclear stress EF: 57%. The left ventricular ejection fraction is normal (55-65%). End diastolic cavity size is normal.   Prior study not available for comparison.   ECHOCARDIOGRAM  ECHOCARDIOGRAM COMPLETE 10/16/2023  Narrative ECHOCARDIOGRAM REPORT    Patient Name:   Teresa [name] Date of Exam: 10/16/2023 Medical Rec #:  [id number]    Height:       59.0 in Accession #:    [id number]   Weight:       183.2 lb  Date of Birth:  07/12/1961   BSA:          1.777 m Patient Age:    62 years     BP:           120/74 mmHg Patient Gender: F            HR:           73 bpm. Exam Location:  [facility]  Procedure: 2D Echo, Cardiac Doppler, Color Doppler and Strain Analysis (Both Spectral and Color Flow Doppler were utilized during procedure).  Indications:    Coronary artery disease involving native coronary artery of native heart without angina  pectoris [I25.10 (ICD-10-CM)]; Hx of CABG [Z95.1 (ICD-10-CM)]; Ischemic cardiomyopathy [I25.5 (ICD-10-CM)]; Hypertensive heart disease with chronic combined systolic and diastolic congestive heart failure (HCC) [I11.0, I50.42 (ICD-10-CM)]; Hyperlipidemia LDL goal <70 [E78.5 (ICD-10-CM)]  History:        Patient has prior history of Echocardiogram examinations, most recent 07/11/2022. CHF and Cardiomyopathy, CAD, Prior CABG; Risk Factors:Hypertension and Dyslipidemia.  Sonographer:    Erminia Hazel RDCS Referring Phys: [id number] BRIAN J MUNLEY  IMPRESSIONS   1. Left ventricular ejection fraction, by estimation, is 35 to 40%. The left ventricle has moderately decreased function. The left ventricle demonstrates global hypokinesis. The left ventricular internal cavity size was moderately dilated. Left ventricular diastolic parameters were normal. 2. Right ventricular systolic function is moderately reduced. The right ventricular size is normal. 3. The mitral valve is normal in structure. Mild mitral valve regurgitation. No evidence of mitral stenosis. 4. The aortic valve was not well visualized. Aortic valve regurgitation is mild. No aortic stenosis is present. 5. Aortic DTA is NWV.  FINDINGS Left Ventricle: Left ventricular ejection fraction, by estimation, is 35 to 40%. The left ventricle has moderately decreased function. The left ventricle demonstrates global hypokinesis. Global longitudinal strain performed but not reported based on interpreter judgement due to suboptimal tracking. The left ventricular internal cavity size was moderately dilated. There is no left ventricular hypertrophy. Left ventricular diastolic parameters were normal. Normal left ventricular filling pressure.  Right Ventricle: The right ventricular size is normal. No increase in right ventricular wall thickness. Right ventricular systolic function is moderately reduced.  Left Atrium: Left atrial size was normal in  size.  Right Atrium: Right atrial size was normal in size.  Pericardium: There is no evidence of pericardial effusion.  Mitral Valve: The mitral valve is normal in structure. Mild mitral valve regurgitation. No evidence of mitral valve stenosis.  Tricuspid Valve: The tricuspid valve is normal in structure. Tricuspid valve regurgitation is not demonstrated. No evidence of tricuspid stenosis.  Aortic Valve: The aortic valve was not well visualized. Aortic valve regurgitation is mild. Aortic regurgitation PHT measures 619 msec. No aortic stenosis is present.  Pulmonic Valve: The pulmonic valve was normal in structure. Pulmonic valve regurgitation is not visualized. No evidence of pulmonic stenosis.  Aorta: The aortic root and ascending aorta are structurally normal, with no evidence of dilitation, the aortic arch was not well visualized and DTA is NWV.  Venous: The pulmonary veins were not well visualized. The inferior vena cava was not well visualized.  IAS/Shunts: No atrial level shunt detected by color flow Doppler.   LEFT VENTRICLE PLAX 2D LVIDd:         5.70 cm      Diastology LVIDs:         4.50 cm      LV e' medial:    6.42 cm/s LV PW:  1.00 cm      LV E/e' medial:  9.1 LV IVS:        1.00 cm      LV e' lateral:   9.79 cm/s LVOT diam:     2.00 cm      LV E/e' lateral: 6.0 LV SV:         66 LV SV Index:   37 LVOT Area:     3.14 cm  LV Volumes (MOD) LV vol d, MOD A2C: 91.2 ml LV vol d, MOD A4C: 112.0 ml LV vol s, MOD A2C: 45.6 ml LV vol s, MOD A4C: 59.0 ml LV SV MOD A2C:     45.6 ml LV SV MOD A4C:     112.0 ml LV SV MOD BP:      53.8 ml  RIGHT VENTRICLE            IVC RV Basal diam:  3.10 cm    IVC diam: 1.40 cm RV Mid diam:    2.30 cm RV S prime:     7.94 cm/s TAPSE (M-mode): 1.2 cm  LEFT ATRIUM             Index        RIGHT ATRIUM           Index LA diam:        3.00 cm 1.69 cm/m   RA Area:     11.00 cm LA Vol (A2C):   50.4 ml 28.36 ml/m  RA Volume:    25.60 ml  14.41 ml/m LA Vol (A4C):   38.1 ml 21.44 ml/m LA Biplane Vol: 43.8 ml 24.65 ml/m AORTIC VALVE LVOT Vmax:   105.50 cm/s LVOT Vmean:  68.400 cm/s LVOT VTI:    0.211 m AI PHT:      619 msec  AORTA Ao Root diam: 3.70 cm Ao Asc diam:  3.50 cm Ao Desc diam: 2.10 cm  MITRAL VALVE MV Area (PHT): 4.13 cm    SHUNTS MV Decel Time: 184 msec    Systemic VTI:  0.21 m MV E velocity: 58.65 cm/s  Systemic Diam: 2.00 cm MV A velocity: 60.50 cm/s MV E/A ratio:  0.97  Zoe Hinds MD Electronically signed by Zoe Hinds MD Signature Date/Time: 10/16/2023/5:45:36 PM    Final    MONITORS  LONG TERM MONITOR (3-14 DAYS) 01/13/2021  Narrative Patch Wear Time:  9 days and 9 hours starting Dec 27, 2020. Indication: Dizziness  Patient had a minimum HR of 46 bpm, maximum HR of 98 bpm, and average HR of 60 bpm. Predominant underlying rhythm was Sinus Rhythm. Bundle Branch Block/IVCD was present.  Premature atrial complexes were rare. Premature ventricular complexes were rare.  Symptoms are associated with sinus rhythm.  No ventricular tachycardia, no pauses, no supraventricular tachycardia noted.  Conclusion: Normal/unremarkable study with no significant arrhythmia.       ______________________________________________________________________________________________      Risk Assessment/Calculations:    CHA2DS2-VASc Score = 3   This indicates a 3.2% annual risk of stroke. The patient's score is based upon: CHF History: 1 HTN History: 0 Diabetes History: 0 Stroke History: 0 Vascular Disease History: 1 Age Score: 0 Gender Score: 1            Physical Exam:   VS:  BP 96/68   Pulse 100   Ht 4\' 10"  (1.473 m)   Wt 178 lb 12.8 oz (81.1 kg)   SpO2 96%   BMI 37.37 kg/m    Wt  Readings from Last 3 Encounters:  12/03/23 178 lb 12.8 oz (81.1 kg)  09/19/23 183 lb 3.2 oz (83.1 kg)  09/10/23 183 lb (83 kg)    GEN: Well nourished, well developed in no acute  distress NECK: No JVD; No carotid bruits CARDIAC: RRR, no murmurs, rubs, gallops RESPIRATORY:  Clear to auscultation without rales, wheezing or rhonchi  ABDOMEN: Soft, non-tender, non-distended EXTREMITIES:  No edema; No deformity   ASSESSMENT AND PLAN: .   CAD -CABG x 4 in 2021 >> ischemic evaluation December 2024 was normal, low risk. Continue aspirin  81 mg daily, continue Lipitor 40 mg daily, continue metoprolol  12.5 mg daily.  She has episodes of intermittent chest pain that are not consistent with angina, this has been present for some time for her.    Dizziness and fatigue-this is likely secondary to her blood pressure being low, very going to adjust her medication as outlined below.  Will repeat a CBC and BMET for any contributory causes.  HFmrEF -NYHA class II, euvolemic echo 10/25/2023 revealed an EF 35 to 40% >> she was started on valsartan  40 mg twice daily with hopes of transitioning her to Entresto, spironolactone , Farxiga .  Today she is feeling quite terrible, her blood pressure is low normal and she is symptomatic.  We will decrease her valsartan  to 40 mg and take once in the evening, we will change her Lasix  to as needed-as she does not have a scale to weigh herself so we discussed indications for when she should take this.  Continue spironolactone  12.5 mg daily, continue Farxiga  10 mg daily.  Dyslipidemia-this appears to be monitored by her PCP, currently on Lipitor 40 mg daily.    CKD -recent creatinine elevated at 1.4, this was after starting GDMT as outlined above.  Will repeat BMET today. Will call her in 2 weeks to see how she is feeling, otherwise, follow up in 6 months.       Dispo: Will request labs from PCP, stress evaluation for upcoming surgery.  Keep follow-up that is already scheduled with Dr. Sandee Crook.  Signed, Terrance Ferretti, NP

## 2023-12-03 ENCOUNTER — Ambulatory Visit: Attending: Cardiology | Admitting: Cardiology

## 2023-12-03 ENCOUNTER — Encounter: Payer: Self-pay | Admitting: Cardiology

## 2023-12-03 VITALS — BP 96/68 | HR 100 | Ht <= 58 in | Wt 178.8 lb

## 2023-12-03 DIAGNOSIS — Z79899 Other long term (current) drug therapy: Secondary | ICD-10-CM | POA: Diagnosis not present

## 2023-12-03 DIAGNOSIS — N289 Disorder of kidney and ureter, unspecified: Secondary | ICD-10-CM

## 2023-12-03 DIAGNOSIS — M1990 Unspecified osteoarthritis, unspecified site: Secondary | ICD-10-CM | POA: Insufficient documentation

## 2023-12-03 DIAGNOSIS — I5020 Unspecified systolic (congestive) heart failure: Secondary | ICD-10-CM

## 2023-12-03 DIAGNOSIS — I2510 Atherosclerotic heart disease of native coronary artery without angina pectoris: Secondary | ICD-10-CM

## 2023-12-03 DIAGNOSIS — R42 Dizziness and giddiness: Secondary | ICD-10-CM

## 2023-12-03 NOTE — Patient Instructions (Signed)
 Medication Instructions:  Your physician has recommended you make the following change in your medication:  Decrease Valsartan  to once in the evening Change Lasix  to as needed for swelling in feet and ankles  *If you need a refill on your cardiac medications before your next appointment, please call your pharmacy*  Lab Work: Your physician recommends that you return for lab work in: Today for CBC and Basic Metabolic Panel  If you have labs (blood work) drawn today and your tests are completely normal, you will receive your results only by: MyChart Message (if you have MyChart) OR A paper copy in the mail If you have any lab test that is abnormal or we need to change your treatment, we will call you to review the results.  Testing/Procedures: NONE  Follow-Up: At [facility], you and your health needs are our priority.  As part of our continuing mission to provide you with exceptional heart care, our providers are all part of one team.  This team includes your primary Cardiologist (physician) and Advanced Practice Providers or APPs (Physician Assistants and Nurse Practitioners) who all work together to provide you with the care you need, when you need it.  Your next appointment:   6 month(s)  Provider:   Zoe Hinds, MD [place]    We recommend signing up for the patient portal called "MyChart".  Sign up information is provided on this After Visit Summary.  MyChart is used to connect with patients for Virtual Visits (Telemedicine).  Patients are able to view lab/test results, encounter notes, upcoming appointments, etc.  Non-urgent messages can be sent to your provider as well.   To learn more about what you can do with MyChart, go to [email or web address].   Other Instructions

## 2023-12-04 DIAGNOSIS — I5020 Unspecified systolic (congestive) heart failure: Secondary | ICD-10-CM

## 2023-12-04 DIAGNOSIS — N289 Disorder of kidney and ureter, unspecified: Secondary | ICD-10-CM

## 2023-12-04 LAB — CBC WITH DIFFERENTIAL/PLATELET
Basophils Absolute: 0.1 x10E3/uL (ref 0.0–0.2)
Basos: 1 %
EOS (ABSOLUTE): 0.1 x10E3/uL (ref 0.0–0.4)
Eos: 1 %
Hematocrit: 39.4 % (ref 34.0–46.6)
Hemoglobin: 12.9 g/dL (ref 11.1–15.9)
Immature Grans (Abs): 0 x10E3/uL (ref 0.0–0.1)
Immature Granulocytes: 0 %
Lymphocytes Absolute: 2.5 x10E3/uL (ref 0.7–3.1)
Lymphs: 26 %
MCH: 30.6 pg (ref 26.6–33.0)
MCHC: 32.7 g/dL (ref 31.5–35.7)
MCV: 94 fL (ref 79–97)
Monocytes Absolute: 0.9 x10E3/uL (ref 0.1–0.9)
Monocytes: 9 %
Neutrophils Absolute: 6.1 x10E3/uL (ref 1.4–7.0)
Neutrophils: 63 %
Platelets: 407 x10E3/uL (ref 150–450)
RBC: 4.21 x10E6/uL (ref 3.77–5.28)
RDW: 12.8 % (ref 11.7–15.4)
WBC: 9.7 x10E3/uL (ref 3.4–10.8)

## 2023-12-04 LAB — BASIC METABOLIC PANEL WITH GFR
BUN/Creatinine Ratio: 11 — ABNORMAL LOW (ref 12–28)
BUN: 18 mg/dL (ref 8–27)
CO2: 19 mmol/L — ABNORMAL LOW (ref 20–29)
Calcium: 10 mg/dL (ref 8.7–10.3)
Chloride: 102 mmol/L (ref 96–106)
Creatinine, Ser: 1.69 mg/dL — ABNORMAL HIGH (ref 0.57–1.00)
Glucose: 152 mg/dL — ABNORMAL HIGH (ref 70–99)
Potassium: 4.3 mmol/L (ref 3.5–5.2)
Sodium: 137 mmol/L (ref 134–144)
eGFR: 34 mL/min/1.73 — ABNORMAL LOW

## 2023-12-06 NOTE — Addendum Note (Signed)
 Addended by: Crawford Tamura P on: 12/06/2023 07:28 AM   Modules accepted: Orders

## 2023-12-12 ENCOUNTER — Encounter: Payer: Self-pay | Admitting: Neurology

## 2023-12-12 ENCOUNTER — Ambulatory Visit (INDEPENDENT_AMBULATORY_CARE_PROVIDER_SITE_OTHER): Admitting: Neurology

## 2023-12-12 VITALS — BP 93/69 | HR 89 | Ht 58.5 in | Wt 181.4 lb

## 2023-12-12 DIAGNOSIS — G40009 Localization-related (focal) (partial) idiopathic epilepsy and epileptic syndromes with seizures of localized onset, not intractable, without status epilepticus: Secondary | ICD-10-CM

## 2023-12-12 LAB — COLOGUARD: COLOGUARD: POSITIVE — AB

## 2023-12-12 LAB — EXTERNAL GENERIC LAB PROCEDURE: COLOGUARD: POSITIVE — AB

## 2023-12-12 MED ORDER — LEVETIRACETAM ER 500 MG PO TB24: ORAL_TABLET | ORAL | 4 refills | Status: AC
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
## 2023-12-12 NOTE — Patient Instructions (Signed)
 Good to see you. Continue Levetiracetam  ER (Keppra ) 500mg : take 2 tablets in AM, 1 tablet in PM. Wishing you all the best, hope you feel better soon! Follow-up in 1 year, call for any changes.    Seizure Precautions: 1. If medication has been prescribed for you to prevent seizures, take it exactly as directed.  Do not stop taking the medicine without talking to your doctor first, even if you have not had a seizure in a long time.   2. Avoid activities in which a seizure would cause danger to yourself or to others.  Don't operate dangerous machinery, swim alone, or climb in high or dangerous places, such as on ladders, roofs, or girders.  Do not drive unless your doctor says you may.  3. If you have any warning that you may have a seizure, lay down in a safe place where you can't hurt yourself.    4.  No driving for 6 months from last seizure, as per [place]  state law.   Please refer to the following link on the [employer]ile[employer] of [employer]'s website for more information: http://www.epilepsyfoundation.org/answerplace/Social/driving/drivingu.cfm   5.  Maintain good sleep hygiene. Avoid alcohol  6.  Contact your doctor if you have any problems that may be related to the medicine you are taking.  7.  Call 911 and bring the patient back to the ED if:        A.  The seizure lasts longer than 5 minutes.       B.  The patient doesn't awaken shortly after the seizure  C.  The patient has new problems such as difficulty seeing, speaking or moving  D.  The patient was injured during the seizure  E.  The patient has a temperature over 102 F (39C)  F.  The patient vomited and now is having trouble breathing

## 2023-12-18 ENCOUNTER — Telehealth: Payer: Self-pay | Admitting: Emergency Medicine

## 2023-12-18 MED ORDER — VALSARTAN 40 MG PO TABS: 20.0000 mg | ORAL_TABLET | Freq: Every evening | ORAL | Status: DC

## 2023-12-18 NOTE — Telephone Encounter (Signed)
-----   Message from Terrance Ferretti sent at 12/03/2023  2:47 PM EDT ----- I saw her in the office today, she had previously been started on spironolactone , valsartan  and Farxiga  following an echo revealing HFrEF.  Her blood pressure was low, decrease her valsartan  to once a day and changed her Lasix  to as needed.  I just want to call and check on her to see if she is feeling any better or still bothered by dizziness.

## 2023-12-18 NOTE — Telephone Encounter (Signed)
 Patient in office for blood work. Spoke to patient regarding how she is feeling per [facility] requests.    Patient reports that she is tired and dizzy all the time. She reports that she has been having low blood pressure. Pattricia Bores made aware and recommends to decrease Valsartan  to half a tab (20 mg) once a day.  Informed patient of this and she verbalized understanding and had no further questions.

## 2023-12-18 NOTE — Addendum Note (Signed)
 Addended by: Demontae Antunes P on: 12/18/2023 10:10 AM   Modules accepted: Orders

## 2023-12-18 NOTE — Telephone Encounter (Signed)
 Left voice mail

## 2023-12-19 ENCOUNTER — Ambulatory Visit: Payer: Self-pay | Admitting: Cardiology

## 2023-12-19 LAB — BASIC METABOLIC PANEL WITH GFR
BUN/Creatinine Ratio: 6 — ABNORMAL LOW (ref 12–28)
BUN: 7 mg/dL — ABNORMAL LOW (ref 8–27)
CO2: 20 mmol/L (ref 20–29)
Calcium: 9.2 mg/dL (ref 8.7–10.3)
Chloride: 108 mmol/L — ABNORMAL HIGH (ref 96–106)
Creatinine, Ser: 1.09 mg/dL — ABNORMAL HIGH (ref 0.57–1.00)
Glucose: 94 mg/dL (ref 70–99)
Potassium: 4.2 mmol/L (ref 3.5–5.2)
Sodium: 142 mmol/L (ref 134–144)
eGFR: 57 mL/min/1.73 — ABNORMAL LOW

## 2023-12-26 ENCOUNTER — Telehealth: Payer: Self-pay | Admitting: Cardiology

## 2023-12-26 NOTE — Telephone Encounter (Signed)
 Called the patient and she stated that she was still feeling a little dizzy and was also a little shaky. She stated that she attempted to cut her yard yesterday and she had to stop because she was so dizzy.

## 2023-12-26 NOTE — Telephone Encounter (Signed)
 I saw her in the office recently, she had previously been started on spironolactone , valsartan  and Farxiga  following an echo revealing HFrEF.  Her blood pressure was low, decrease her valsartan  to once a day and changed her Lasix  to as needed.  I just want to call and check on her to see if she is feeling any better or still bothered by dizziness.

## 2023-12-27 NOTE — Telephone Encounter (Signed)
 Called patient and informed her of [place] [place] recommendations.  She stated she will start tomorrow and I informed her I will send a bp log in her mychart and she can return via mychart or bring into the office. She thanked me for the call and had no additional questions.

## 2024-02-01 ENCOUNTER — Encounter (HOSPITAL_COMMUNITY): Payer: Self-pay | Admitting: Interventional Radiology

## 2024-03-02 ENCOUNTER — Other Ambulatory Visit: Payer: Self-pay | Admitting: Cardiology

## 2024-03-02 DIAGNOSIS — I5020 Unspecified systolic (congestive) heart failure: Secondary | ICD-10-CM

## 2024-06-11 ENCOUNTER — Other Ambulatory Visit: Payer: Self-pay | Admitting: Cardiology

## 2024-06-25 ENCOUNTER — Encounter: Payer: Self-pay | Admitting: Neurology

## 2024-09-09 ENCOUNTER — Other Ambulatory Visit: Payer: Self-pay | Admitting: Cardiology

## 2024-12-11 ENCOUNTER — Ambulatory Visit: Admitting: Neurology
